# Patient Record
Sex: Male | Born: 1950
Health system: Southern US, Community
[De-identification: ages and names within clinical notes are randomized; demographics above are authoritative.]

## PROBLEM LIST (undated history)

## (undated) DIAGNOSIS — F17200 Nicotine dependence, unspecified, uncomplicated: Secondary | ICD-10-CM

## (undated) DIAGNOSIS — N189 Chronic kidney disease, unspecified: Secondary | ICD-10-CM

## (undated) DIAGNOSIS — E119 Type 2 diabetes mellitus without complications: Secondary | ICD-10-CM

## (undated) DIAGNOSIS — IMO0001 Reserved for inherently not codable concepts without codable children: Secondary | ICD-10-CM

## (undated) DIAGNOSIS — E559 Vitamin D deficiency, unspecified: Secondary | ICD-10-CM

## (undated) DIAGNOSIS — E785 Hyperlipidemia, unspecified: Secondary | ICD-10-CM

## (undated) DIAGNOSIS — Z9889 Other specified postprocedural states: Secondary | ICD-10-CM

## (undated) DIAGNOSIS — E669 Obesity, unspecified: Secondary | ICD-10-CM

## (undated) DIAGNOSIS — H269 Unspecified cataract: Secondary | ICD-10-CM

## (undated) DIAGNOSIS — M169 Osteoarthritis of hip, unspecified: Secondary | ICD-10-CM

## (undated) DIAGNOSIS — I10 Essential (primary) hypertension: Secondary | ICD-10-CM

## (undated) HISTORY — DX: Type 2 diabetes mellitus without complications: E11.9

## (undated) HISTORY — DX: Unspecified cataract: H26.9

## (undated) HISTORY — DX: Chronic kidney disease, unspecified: N18.9

## (undated) HISTORY — DX: Hyperlipidemia, unspecified: E78.5

## (undated) HISTORY — DX: Osteoarthritis of hip, unspecified: M16.9

## (undated) HISTORY — DX: Nicotine dependence, unspecified, uncomplicated: F17.200

## (undated) HISTORY — PX: EYE SURGERY: SHX253

## (undated) HISTORY — DX: Reserved for inherently not codable concepts without codable children: IMO0001

## (undated) HISTORY — DX: Vitamin D deficiency, unspecified: E55.9

## (undated) HISTORY — DX: Obesity, unspecified: E66.9

## (undated) HISTORY — DX: Essential (primary) hypertension: I10

---

## 2014-05-04 LAB — MICROALBUMIN, URINE: Microalb, Ur: 104

## 2014-07-06 DIAGNOSIS — E119 Type 2 diabetes mellitus without complications: Secondary | ICD-10-CM | POA: Diagnosis not present

## 2014-10-17 DIAGNOSIS — E785 Hyperlipidemia, unspecified: Secondary | ICD-10-CM | POA: Diagnosis not present

## 2014-10-17 DIAGNOSIS — F17210 Nicotine dependence, cigarettes, uncomplicated: Secondary | ICD-10-CM | POA: Diagnosis not present

## 2014-10-17 DIAGNOSIS — F5109 Other insomnia not due to a substance or known physiological condition: Secondary | ICD-10-CM | POA: Diagnosis not present

## 2014-10-17 DIAGNOSIS — E119 Type 2 diabetes mellitus without complications: Secondary | ICD-10-CM | POA: Diagnosis not present

## 2014-10-17 DIAGNOSIS — I10 Essential (primary) hypertension: Secondary | ICD-10-CM | POA: Diagnosis not present

## 2015-04-18 DIAGNOSIS — I10 Essential (primary) hypertension: Secondary | ICD-10-CM | POA: Diagnosis not present

## 2015-04-18 DIAGNOSIS — E1165 Type 2 diabetes mellitus with hyperglycemia: Secondary | ICD-10-CM | POA: Diagnosis not present

## 2015-04-18 DIAGNOSIS — E785 Hyperlipidemia, unspecified: Secondary | ICD-10-CM | POA: Diagnosis not present

## 2015-04-18 DIAGNOSIS — G4700 Insomnia, unspecified: Secondary | ICD-10-CM | POA: Diagnosis not present

## 2015-07-11 DIAGNOSIS — H2513 Age-related nuclear cataract, bilateral: Secondary | ICD-10-CM | POA: Diagnosis not present

## 2015-07-11 DIAGNOSIS — H40053 Ocular hypertension, bilateral: Secondary | ICD-10-CM | POA: Diagnosis not present

## 2015-10-09 DIAGNOSIS — H40053 Ocular hypertension, bilateral: Secondary | ICD-10-CM | POA: Diagnosis not present

## 2015-10-09 LAB — HM DIABETES EYE EXAM

## 2015-10-16 ENCOUNTER — Encounter: Payer: Self-pay | Admitting: Family Medicine

## 2015-10-16 ENCOUNTER — Ambulatory Visit (INDEPENDENT_AMBULATORY_CARE_PROVIDER_SITE_OTHER): Payer: Medicare Other | Admitting: Family Medicine

## 2015-10-16 VITALS — BP 126/83 | HR 103 | Ht 69.75 in | Wt 246.0 lb

## 2015-10-16 DIAGNOSIS — E119 Type 2 diabetes mellitus without complications: Secondary | ICD-10-CM | POA: Diagnosis not present

## 2015-10-16 DIAGNOSIS — I10 Essential (primary) hypertension: Secondary | ICD-10-CM

## 2015-10-16 DIAGNOSIS — E669 Obesity, unspecified: Secondary | ICD-10-CM

## 2015-10-16 DIAGNOSIS — Z87891 Personal history of nicotine dependence: Secondary | ICD-10-CM | POA: Insufficient documentation

## 2015-10-16 DIAGNOSIS — IMO0001 Reserved for inherently not codable concepts without codable children: Secondary | ICD-10-CM

## 2015-10-16 DIAGNOSIS — E1129 Type 2 diabetes mellitus with other diabetic kidney complication: Secondary | ICD-10-CM | POA: Insufficient documentation

## 2015-10-16 DIAGNOSIS — E785 Hyperlipidemia, unspecified: Secondary | ICD-10-CM

## 2015-10-16 DIAGNOSIS — Z720 Tobacco use: Secondary | ICD-10-CM | POA: Diagnosis not present

## 2015-10-16 DIAGNOSIS — F17200 Nicotine dependence, unspecified, uncomplicated: Secondary | ICD-10-CM

## 2015-10-16 HISTORY — DX: Obesity, unspecified: E66.9

## 2015-10-16 HISTORY — DX: Essential (primary) hypertension: I10

## 2015-10-16 MED ORDER — ALPRAZOLAM 0.5 MG PO TABS: 0.5000 mg | ORAL_TABLET | Freq: Every evening | ORAL | Status: DC | PRN

## 2015-10-16 NOTE — Assessment & Plan Note (Signed)
Pt tried and failed chantix- could not tolerate; he will think about it and let me know when he is ready.  Pre-contemplative stages

## 2015-10-16 NOTE — Progress Notes (Signed)
Jose Munoz, D.O. Family Medicine Physician [facility] Location: Primary Care at [facility]     Subjective:    CC: New pt, here to establish care.   HPI: Jose Munoz is a pleasant 65 y.o. male who presents to [place] at [name] today to establish care here.  Only saw his PCP prior q 57mo   - FKathryne Eriksson MD- [facility], Now wishes to switch due to proximity of our clinic to his home, and patient has heard good things about [place]     Pt's Wife passed 4.5 yrs ago- together for over 581 after bout with breast CA.   Two children- ages 461 and 498yo.   4 grandchildren- 2 granddaughters/ 2 grandsons.   [place]   Retired age [id number] [employer] from [employer] yrs. Financially sound  - Type II DM: age 65yo, - diet controlled for 12 yrs, then meds.  625mogo--> 9.2, just taking jaTong[place]or about 5-6 months ever since last checked.  Doesn't check BS's.   Machine broke.  March- DM eye exam.  q 79m53moo check pressures.   -  Tob Cess: Chantix in past- 2 wks and made him feel crazy.   Not interested in quitting now. Has smoked for over 25 years at 1 pack per day at least. Wife was a smoker also so patient had secondhand exposure as well.  - Sleep: Takes xanax may be 2/mo at most to help with sleep. Has not been an issue lately.  - Preventative healthcare:  Patient has never had a abdominal ultrasound to rule out AAA, CT of chest, has never followed up for regular prostate exams nor has he had a colonoscopy.  We discussed these issues today and patient said he will consider them in the future as he gets to know me.  Past Medical History  Diagnosis Date  . Hyperlipidemia   . Hypertension   . Diabetes mellitus without complication ([facility]   History reviewed. No pertinent past surgical history.  Family History  Problem Relation Age of Onset  . Diabetes Mother   . Congestive Heart Failure Mother   . Cancer Sister      breast  . Cancer Sister     cervical  . Heart attack Brother     History  Drug Use No  ,  History  Alcohol Use  . 1.8 oz/week  . 1 Glasses of wine, 2 Shots of liquor per week  ,  History  Smoking status  . Current Every Day Smoker -- 1.00 packs/day  . Types: Cigarettes  Smokeless tobacco  . Never Used  ,  History  Sexual Activity  . Sexual Activity: No    Patient's Medications  New Prescriptions   No medications on file  Previous Medications   ALPRAZOLAM (XANAX) 0.5 MG TABLET    Take 0.5 mg by mouth at bedtime as needed for anxiety.   ASPIRIN 81 MG TABLET    Take 81 mg by mouth daily.   GLIMEPIRIDE (AMARYL) 4 MG TABLET    Take 1 tablet by mouth daily.   JANUVIA 50 MG TABLET    Take 1 tablet by mouth daily.   LISINOPRIL (PRINIVIL,ZESTRIL) 10 MG TABLET    Take 1 tablet by mouth daily.   PIOGLITAZONE-METFORMIN (ACTOPLUS MET) 15-500 MG TABLET    Take 1 tablet by mouth 3 (three) times daily.   SIMVASTATIN (ZOCOR) 40 MG  TABLET    Take 1 tablet by mouth daily.  Modified Medications   No medications on file  Discontinued Medications   No medications on file    ALLERGIES: Review of patient's allergies indicates no known allergies.   Review of Systems: Full 14 point ROS performed via "adult medical history form".  Negative except for nothing   Objective:   Blood pressure 126/83, pulse 103, height 5' 9.75" (1.772 m), weight 246 lb (111.585 kg). Body mass index is 35.54 kg/(m^2).  General: Well Developed, well nourished, and in no acute distress.  Neuro: Alert and oriented x3, extra-ocular muscles intact, sensation grossly intact.  HEENT: Normocephalic, atraumatic, pupils equal round reactive to light, neck supple, no gross masses, no carotid bruits, no JVD apprec Skin: no gross suspicious lesions or rashes  Cardiac: Regular rate and rhythm, no murmurs rubs or gallops.  Respiratory: Essentially clear to auscultation bilaterally. Not using accessory muscles, speaking  in full sentences.  Abdominal: Soft, Obese, not grossly distended Musculoskeletal: Ambulates w/o diff, FROM * 4 ext.  Vasc: less 2 sec cap RF, warm and pink  Psych:  No HI/SI, judgement and insight good.    Impression and Recommendations:    The patient was counselled, risk factors were discussed, anticipatory guidance given.     -Briefly touched on preventative health maintenance screenings with patient and he was not interested in hearing about colonoscopy, abdominal ultrasound screening for AAA, CT scan of chest etc. but did say he would come in yearly for a full exam with me.  Essential hypertension Well controlled at current; cont meds.  Pt doesn't need RF's yet  Diabetes mellitus without complication ([place]) Will need to obtain old med records; obtain labs 2 days--> fasting.  Last A1c per pt was 9.2 and januvia was started... Await lab resutls.   Hyperlipidemia Obtain labs.  Cont current meds.   Smoking Pt tried and failed chantix- could not tolerate; he will think about it and let me know when he is ready.  Pre-contemplative stages  Obese Patient aware that he needs to lose weight which would help with his joints and multiple medical problems..  We touched briefly on different diabetes meds which may help with weight loss and we will await the lab results and then adjust accordingly. Nutrition and exercise counseling performed.   Pt to get fasting labs in the future prior to his complete physical exam with me: CBC, CMP, TSH, A1c, FLP, PSA, Vit D, B12.  Asked patient to make a follow-up appointment in the very near future for this  Refill Xanax for patient to use when necessary sleep otherwise we will await results of his blood work and then refill meds then.

## 2015-10-16 NOTE — Assessment & Plan Note (Signed)
Patient aware that he needs to lose weight which would help with his joints and multiple medical problems..  We touched briefly on different diabetes meds which may help with weight loss and we will await the lab results and then adjust accordingly. Nutrition and exercise counseling performed.

## 2015-10-16 NOTE — Patient Instructions (Signed)
- Continue all meds as currently written. Will refill Xanax after risks and benefits discussed with patient.  -Get fasting labs in 2-3 days- come in at your convenience; scheduled before leaving clinic   Obesity Obesity is having too much body fat and a body mass index (BMI) of 30 or more. BMI is a number that is based on your height and weight. BMI is usually figured out by your doctor during regular wellness visits. The number is an estimate of how much body fat you have. Obesity can happen if you eat more calories than you can burn with exercise or other activity. It can cause major health problems or emergencies.  HOME CARE  Exercise and be active as told by your doctor. Try:  Using stairs when you can.  Parking farther away from store doors.  Gardening, biking, or walking.  Eat healthy foods and drinks that are low in calories. Eat more fruits and vegetables.  Limit fast food, sweets, and snack foods that are made with ingredients that are not natural (processed food).  Eat smaller amounts of food.  Keep a journal and write down what you eat every day. Websites can help with this.  Avoid drinking alcohol. Drink more water and drinks that have no calories.  Take vitamins and dietary pills (supplements) only as told by your doctor.  Try going to weight-loss support groups or classes. These can help to lessen stress. Dietitians and counselors may also help. GET HELP RIGHT AWAY IF:  You have pain or tightness in your chest.  You have trouble breathing or feel short of breath.  You feel weak or have loss of feeling (numbness) in your legs.  You feel confused or have trouble talking.  You have sudden changes in your vision.   This information is not intended to replace advice given to you by your health care provider. Make sure you discuss any questions you have with your health care provider.   Document Released: 07/27/2011 Document Revised: 05/25/2014 Document Reviewed:  07/27/2011 Elsevier Interactive Patient Education 2016 Elsevier Inc. __________________________________________________________________  Blood Glucose Monitoring, Adult Monitoring your blood glucose (also know as blood sugar) helps you to manage your diabetes. It also helps you and your health care provider monitor your diabetes and determine how well your treatment plan is working. WHY SHOULD YOU MONITOR YOUR BLOOD GLUCOSE?  It can help you understand how food, exercise, and medicine affect your blood glucose.  It allows you to know what your blood glucose is at any given moment. You can quickly tell if you are having low blood glucose (hypoglycemia) or high blood glucose (hyperglycemia).  It can help you and your health care provider know how to adjust your medicines.  It can help you understand how to manage an illness or adjust medicine for exercise. WHEN SHOULD YOU TEST? Your health care provider will help you decide how often you should check your blood glucose. This may depend on the type of diabetes you have, your diabetes control, or the types of medicines you are taking. Be sure to write down all of your blood glucose readings so that this information can be reviewed with your health care provider. See below for examples of testing times that your health care provider may suggest. Type 1 Diabetes  Test at least 2 times per day if your diabetes is well controlled, if you are using an insulin pump, or if you perform multiple daily injections.  If your diabetes is not well controlled or  if you are sick, you may need to test more often.  It is a good idea to also test:  Before every insulin injection.  Before and after exercise.  Between meals and 2 hours after a meal.  Occasionally between 2:00 a.m. and 3:00 a.m. Type 2 Diabetes  If you are taking insulin, test at least 2 times per day. However, it is best to test before every insulin injection.  If you take medicines by  mouth (orally), test 2 times a day.  If you are on a controlled diet, test once a day.  If your diabetes is not well controlled or if you are sick, you may need to monitor more often. HOW TO MONITOR YOUR BLOOD GLUCOSE Supplies Needed  Blood glucose meter.  Test strips for your meter. Each meter has its own strips. You must use the strips that go with your own meter.  A pricking needle (lancet).  A device that holds the lancet (lancing device).  A journal or log book to write down your results. Procedure  Wash your hands with soap and water. Alcohol is not preferred.  Prick the side of your finger (not the tip) with the lancet.  Gently milk the finger until a small drop of blood appears.  Follow the instructions that come with your meter for inserting the test strip, applying blood to the strip, and using your blood glucose meter. Other Areas to Get Blood for Testing Some meters allow you to use other areas of your body (other than your finger) to test your blood. These areas are called alternative sites. The most common alternative sites are:  The forearm.  The thigh.  The back area of the lower leg.  The palm of the hand. The blood flow in these areas is slower. Therefore, the blood glucose values you get may be delayed, and the numbers are different from what you would get from your fingers. Do not use alternative sites if you think you are having hypoglycemia. Your reading will not be accurate. Always use a finger if you are having hypoglycemia. Also, if you cannot feel your lows (hypoglycemia unawareness), always use your fingers for your blood glucose checks. ADDITIONAL TIPS FOR GLUCOSE MONITORING  Do not reuse lancets.  Always carry your supplies with you.  All blood glucose meters have a 24-hour "hotline" number to call if you have questions or need help.  Adjust (calibrate) your blood glucose meter with a control solution after finishing a few boxes of  strips. BLOOD GLUCOSE RECORD KEEPING It is a good idea to keep a daily record or log of your blood glucose readings. Most glucose meters, if not all, keep your glucose records stored in the meter. Some meters come with the ability to download your records to your home computer. Keeping a record of your blood glucose readings is especially helpful if you are wanting to look for patterns. Make notes to go along with the blood glucose readings because you might forget what happened at that exact time. Keeping good records helps you and your health care provider to work together to achieve good diabetes management.    This information is not intended to replace advice given to you by your health care provider. Make sure you discuss any questions you have with your health care provider.   Document Released: 05/07/2003 Document Revised: 05/25/2014 Document Reviewed: 09/26/2012 Elsevier Interactive Patient Education [employer] __________________________________________________________________ Diabetes and Exercise Exercising regularly is important. It is not just about  losing weight. It has many health benefits, such as:  Improving your overall fitness, flexibility, and endurance.  Increasing your bone density.  Helping with weight control.  Decreasing your body fat.  Increasing your muscle strength.  Reducing stress and tension.  Improving your overall health. People with diabetes who exercise gain additional benefits because exercise:  Reduces appetite.  Improves the body's use of blood sugar (glucose).  Helps lower or control blood glucose.  Decreases blood pressure.  Helps control blood lipids (such as cholesterol and triglycerides).  Improves the body's use of the hormone insulin by:  Increasing the body's insulin sensitivity.  Reducing the body's insulin needs.  Decreases the risk for heart disease because exercising:  Lowers cholesterol and triglycerides  levels.  Increases the levels of good cholesterol (such as high-density lipoproteins [HDL]) in the body.  Lowers blood glucose levels. YOUR ACTIVITY PLAN  Choose an activity that you enjoy, and set realistic goals. To exercise safely, you should begin practicing any new physical activity slowly, and gradually increase the intensity of the exercise over time. Your health care provider or diabetes educator can help create an activity plan that works for you. General recommendations include:  Encouraging children to engage in at least 60 minutes of physical activity each day.  Stretching and performing strength training exercises, such as yoga or weight lifting, at least 2 times per week.  Performing a total of at least 150 minutes of moderate-intensity exercise each week, such as brisk walking or water aerobics.  Exercising at least 3 days per week, making sure you allow no more than 2 consecutive days to pass without exercising.  Avoiding long periods of inactivity (90 minutes or more). When you have to spend an extended period of time sitting down, take frequent breaks to walk or stretch. RECOMMENDATIONS FOR EXERCISING WITH TYPE 1 OR TYPE 2 DIABETES   Check your blood glucose before exercising. If blood glucose levels are greater than 240 mg/dL, check for urine ketones. Do not exercise if ketones are present.  Avoid injecting insulin into areas of the body that are going to be exercised. For example, avoid injecting insulin into:  The arms when playing tennis.  The legs when jogging.  Keep a record of:  Food intake before and after you exercise.  Expected peak times of insulin action.  Blood glucose levels before and after you exercise.  The type and amount of exercise you have done.  Review your records with your health care provider. Your health care provider will help you to develop guidelines for adjusting food intake and insulin amounts before and after exercising.  If you  take insulin or oral hypoglycemic agents, watch for signs and symptoms of hypoglycemia. They include:  Dizziness.  Shaking.  Sweating.  Chills.  Confusion.  Drink plenty of water while you exercise to prevent dehydration or heat stroke. Body water is lost during exercise and must be replaced.  Talk to your health care provider before starting an exercise program to make sure it is safe for you. Remember, almost any type of activity is better than none.   This information is not intended to replace advice given to you by your health care provider. Make sure you discuss any questions you have with your health care provider.   Document Released: 07/25/2003 Document Revised: 09/18/2014 Document Reviewed: 10/11/2012 Elsevier Interactive Patient Education [employer]

## 2015-10-16 NOTE — Assessment & Plan Note (Signed)
Obtain labs.  Cont current meds.

## 2015-10-16 NOTE — Assessment & Plan Note (Signed)
Well controlled at current; cont meds.  Pt doesn't need RF's yet

## 2015-10-16 NOTE — Assessment & Plan Note (Addendum)
Will need to obtain old med records; obtain labs 2 days--> fasting.  Last A1c per pt was 9.2 and januvia was started... Await lab resutls.

## 2015-10-18 ENCOUNTER — Ambulatory Visit (INDEPENDENT_AMBULATORY_CARE_PROVIDER_SITE_OTHER): Payer: Medicare Other | Admitting: Family Medicine

## 2015-10-18 DIAGNOSIS — E119 Type 2 diabetes mellitus without complications: Secondary | ICD-10-CM | POA: Diagnosis not present

## 2015-10-18 DIAGNOSIS — Z125 Encounter for screening for malignant neoplasm of prostate: Secondary | ICD-10-CM

## 2015-10-18 DIAGNOSIS — Z0000 Encounter for general adult medical examination without abnormal findings: Secondary | ICD-10-CM | POA: Diagnosis not present

## 2015-10-18 DIAGNOSIS — E785 Hyperlipidemia, unspecified: Secondary | ICD-10-CM | POA: Diagnosis not present

## 2015-10-18 DIAGNOSIS — I10 Essential (primary) hypertension: Secondary | ICD-10-CM

## 2015-10-18 DIAGNOSIS — Z1321 Encounter for screening for nutritional disorder: Secondary | ICD-10-CM

## 2015-10-18 LAB — CBC WITH DIFFERENTIAL/PLATELET
BASOS PCT: 1 %
Basophils Absolute: 65 cells/uL (ref 0–200)
EOS ABS: 195 {cells}/uL (ref 15–500)
EOS PCT: 3 %
HCT: 47.4 % (ref 38.5–50.0)
Hemoglobin: 15.8 g/dL (ref 13.2–17.1)
LYMPHS PCT: 33 %
Lymphs Abs: 2145 cells/uL (ref 850–3900)
MCH: 30.4 pg (ref 27.0–33.0)
MCHC: 33.3 g/dL (ref 32.0–36.0)
MCV: 91.3 fL (ref 80.0–100.0)
MONOS PCT: 9 %
MPV: 10.7 fL (ref 7.5–12.5)
Monocytes Absolute: 585 cells/uL (ref 200–950)
NEUTROS ABS: 3510 {cells}/uL (ref 1500–7800)
Neutrophils Relative %: 54 %
PLATELETS: 276 10*3/uL (ref 140–400)
RBC: 5.19 MIL/uL (ref 4.20–5.80)
RDW: 13.3 % (ref 11.0–15.0)
WBC: 6.5 10*3/uL (ref 3.8–10.8)

## 2015-10-19 LAB — VITAMIN B12: VITAMIN B 12: 389 pg/mL (ref 200–1100)

## 2015-10-19 LAB — PSA: PSA: 0.71 ng/mL (ref <=4.00)

## 2015-10-19 LAB — COMPREHENSIVE METABOLIC PANEL
ALBUMIN: 4.3 g/dL (ref 3.6–5.1)
ALK PHOS: 61 U/L (ref 40–115)
ALT: 39 U/L (ref 9–46)
AST: 19 U/L (ref 10–35)
BILIRUBIN TOTAL: 0.4 mg/dL (ref 0.2–1.2)
BUN: 18 mg/dL (ref 7–25)
CALCIUM: 9.2 mg/dL (ref 8.6–10.3)
CO2: 22 mmol/L (ref 20–31)
CREATININE: 0.82 mg/dL (ref 0.70–1.25)
Chloride: 100 mmol/L (ref 98–110)
Glucose, Bld: 261 mg/dL — ABNORMAL HIGH (ref 65–99)
Potassium: 5.1 mmol/L (ref 3.5–5.3)
SODIUM: 135 mmol/L (ref 135–146)
TOTAL PROTEIN: 7.4 g/dL (ref 6.1–8.1)

## 2015-10-19 LAB — LIPID PANEL
CHOL/HDL RATIO: 3 ratio (ref <=5.0)
CHOLESTEROL: 131 mg/dL (ref 125–200)
HDL: 43 mg/dL (ref >=40)
LDL Cholesterol: 60 mg/dL (ref <130)
TRIGLYCERIDES: 142 mg/dL (ref <150)
VLDL: 28 mg/dL (ref <30)

## 2015-10-19 LAB — HEMOGLOBIN A1C
Hgb A1c MFr Bld: 10 % — ABNORMAL HIGH (ref <5.7)
MEAN PLASMA GLUCOSE: 240 mg/dL

## 2015-10-19 LAB — VITAMIN D 25 HYDROXY (VIT D DEFICIENCY, FRACTURES): Vit D, 25-Hydroxy: 26 ng/mL — ABNORMAL LOW (ref 30–100)

## 2015-10-19 LAB — TSH: TSH: 1.92 m[IU]/L (ref 0.40–4.50)

## 2015-10-21 ENCOUNTER — Encounter: Payer: Self-pay | Admitting: Family Medicine

## 2015-10-21 NOTE — Progress Notes (Signed)
     Subjective:    HPI: Jose Munoz is a 65 y.o. male who presents to [place] at [facility] today for blood work.  No acute complaints.  Doing well.   Past Medical History  Diagnosis Date  . Hyperlipidemia   . Hypertension   . Diabetes mellitus without complication ([place])   . Smoking    No past surgical history on file.  History  Drug Use No  ,  History  Alcohol Use  . 1.8 oz/week  . 1 Glasses of wine, 2 Shots of liquor per week  ,  History  Smoking status  . Current Every Day Smoker -- 1.00 packs/day  . Types: Cigarettes  Smokeless tobacco  . Never Used  ,  History  Sexual Activity  . Sexual Activity: No    Patient's Medications  New Prescriptions   No medications on file  Previous Medications   ALPRAZOLAM (XANAX) 0.5 MG TABLET    Take 1 tablet (0.5 mg total) by mouth at bedtime as needed for anxiety.   ASPIRIN 81 MG TABLET    Take 81 mg by mouth daily.   GLIMEPIRIDE (AMARYL) 4 MG TABLET    Take 1 tablet by mouth daily.   JANUVIA 50 MG TABLET    Take 1 tablet by mouth daily.   LISINOPRIL (PRINIVIL,ZESTRIL) 10 MG TABLET    Take 1 tablet by mouth daily.   PIOGLITAZONE-METFORMIN (ACTOPLUS MET) 15-500 MG TABLET    Take 1 tablet by mouth 3 (three) times daily.   SIMVASTATIN (ZOCOR) 40 MG TABLET    Take 1 tablet by mouth daily.  Modified Medications   No medications on file  Discontinued Medications   No medications on file    Review of patient's allergies indicates no known allergies.   Objective:    General: Well Developed, well nourished, and in no acute distress.  HEENT: Normocephalic, atraumatic Skin: Warm and dry, cap RF less 2 sec, good turgor Respiratory: speaking in full sentences. NeuroM-Sk: Ambulates w/o assistance Psych: A and O *3    Impression and Recommendations:     Orders Placed This Encounter  Procedures  . Comp Met (CMET)  . CBC w/Diff  . TSH  . HgB A1c  . Lipid panel  . PSA  . Vitamin D (25 hydroxy)  .  B12  . Microalbumin, urine    This external order was created through the Results Console.   Patient understands we will be in touch with him in the near future regarding these results.  Note: This document was prepared using Dragon voice recognition software and may include unintentional dictation errors.

## 2015-10-21 NOTE — Progress Notes (Signed)
Patient ID: Jose Munoz, male   DOB: 1951-02-25, 65 y.o.   MRN: [id number] Pt here for lab collection.  Tiajuana Amass. Taelyn Nemes, CMA

## 2015-10-23 ENCOUNTER — Other Ambulatory Visit: Payer: Self-pay

## 2015-10-23 DIAGNOSIS — E119 Type 2 diabetes mellitus without complications: Secondary | ICD-10-CM

## 2015-10-23 MED ORDER — JANUVIA 50 MG PO TABS: 50.0000 mg | ORAL_TABLET | Freq: Every day | ORAL | Status: DC

## 2015-10-23 MED ORDER — BLOOD GLUCOSE MONITOR SYSTEM W/DEVICE KIT: PACK | Status: DC

## 2015-10-23 MED ORDER — BLOOD GLUCOSE TEST VI STRP: ORAL_STRIP | Status: DC

## 2015-10-23 MED ORDER — VITAMIN D (ERGOCALCIFEROL) 1.25 MG (50000 UNIT) PO CAPS: 50000.0000 [IU] | ORAL_CAPSULE | ORAL | Status: DC

## 2015-10-23 MED ORDER — CANAGLIFLOZIN-METFORMIN HCL 50-1000 MG PO TABS: 1.0000 | ORAL_TABLET | Freq: Two times a day (BID) | ORAL | Status: DC

## 2015-11-05 ENCOUNTER — Encounter: Payer: Self-pay | Admitting: Family Medicine

## 2015-11-05 ENCOUNTER — Ambulatory Visit (INDEPENDENT_AMBULATORY_CARE_PROVIDER_SITE_OTHER): Payer: Medicare Other | Admitting: Family Medicine

## 2015-11-05 VITALS — BP 100/68 | HR 103 | Ht 69.75 in | Wt 237.9 lb

## 2015-11-05 DIAGNOSIS — M169 Osteoarthritis of hip, unspecified: Secondary | ICD-10-CM

## 2015-11-05 DIAGNOSIS — Z716 Tobacco abuse counseling: Secondary | ICD-10-CM | POA: Insufficient documentation

## 2015-11-05 DIAGNOSIS — E559 Vitamin D deficiency, unspecified: Secondary | ICD-10-CM | POA: Diagnosis not present

## 2015-11-05 DIAGNOSIS — F17200 Nicotine dependence, unspecified, uncomplicated: Secondary | ICD-10-CM

## 2015-11-05 DIAGNOSIS — M160 Bilateral primary osteoarthritis of hip: Secondary | ICD-10-CM

## 2015-11-05 DIAGNOSIS — Z720 Tobacco use: Secondary | ICD-10-CM

## 2015-11-05 DIAGNOSIS — E669 Obesity, unspecified: Secondary | ICD-10-CM

## 2015-11-05 DIAGNOSIS — I10 Essential (primary) hypertension: Secondary | ICD-10-CM

## 2015-11-05 DIAGNOSIS — E119 Type 2 diabetes mellitus without complications: Secondary | ICD-10-CM

## 2015-11-05 DIAGNOSIS — E785 Hyperlipidemia, unspecified: Secondary | ICD-10-CM

## 2015-11-05 HISTORY — DX: Osteoarthritis of hip, unspecified: M16.9

## 2015-11-05 HISTORY — DX: Vitamin D deficiency, unspecified: E55.9

## 2015-11-05 MED ORDER — MELOXICAM 15 MG PO TABS: ORAL_TABLET | ORAL | Status: DC

## 2015-11-05 NOTE — Assessment & Plan Note (Signed)
Patient aware that weight loss would help his chronic diseases as well as his joints feel better.  Patient declined diabetic\nutrition counseling referral.

## 2015-11-05 NOTE — Assessment & Plan Note (Signed)
Patient's cholesterol levels are at goal with LDL under 70 and triglycerides less than 150.  Encourage prudent diet, exercise and lifestyle modifications.

## 2015-11-05 NOTE — Assessment & Plan Note (Signed)
Patient prefers taking 1 pill weekly versus a daily over-the-counter. He understands that should be rechecked in 4-6 months.  Counseling done.

## 2015-11-05 NOTE — Progress Notes (Signed)
Subjective:    CC:   HPI: Jose Munoz is a 65 y.o. male who presents to [place] at [facility] today for f/up labs/ discuss med changes etc.    All lab values reviewed in full with patient today and all questions answered.  DM- onset at age 56 yo.  No meds 12 yrs or so with diet/ exercise.  Well managed up til about 1-2 yrs ago and now above 7- jumped up precipitously over the past year from 7 up to 48 and then most recently is at 30.     Patient denies any major changes in his diet, activity level or weight which may have caused this.  Patient still remains asymptomatic.  No polyuria polydipsia.  Denies numbness tingling in extremities. Denies any visual change.    Patient did not start his new diabetes meds yet that I advised he change to with these most recent labs that were done.   He was still taking the glimepiride and Actos plus.   He will change to the Januvia and start that Invokamet in the near future when he leaves today.   He did pick them up already.  a lot DM in siblings - even in thin siblings.  Obese:   Walking 4 times per week 3.2 mph and goes 1- 1.31mles but hips hurt sometimes and he has been taking naprosyn otc/ alleve twice daily for a year now. Feels stiff especially once resting and tries to get moving again. Once moving, they do feel better for the most part.  Father h/o arthirtis    Smoking: Patient is not interested in quitting. He still about 1 pack per day. He did not tolerate Chantix in the past and is not ready for a prescription for nicotine patchs or wellbutrin or other etc.  Pulled small tick off himself about 2 wks ago--- a sx, but just wanted me to know about it.     ----> recent labs and when I advised to patient at that time: --> Tell patient his vitamin D is too low at 26. He will need ergocalciferol 50,000 IU weekly. Please give him 12 weeks with 4 refills. Tell him to make appointment to have this recheck 4 months. All other labs  are within normal limits except for his sugars and his A1c which is up to 10.   --> I recommend that he stop his glimepiride and Actosplus met tabs; continue Januvia at 50 mg, start invokamet 50\1000 mg 1 tab by mouth twice a day. We will recheck A1c in 3 months. And if he is not under 8 at that time we will increase his Januvia and/or Invokamet. ---> Needs to check his fasting blood sugars at least every other morning and 2 hour postprandial. ---> I also recommend that patient go for nutrition counseling with a diabetic educator.     Past Medical History  Diagnosis Date  . Hyperlipidemia   . Hypertension   . Diabetes mellitus without complication ([place]   . Smoking     History reviewed. No pertinent past surgical history.  Family History  Problem Relation Age of Onset  . Diabetes Mother   . Congestive Heart Failure Mother   . Cancer Sister     breast  . Cancer Sister     cervical  . Heart attack Brother     History  Drug Use No  ,  History  Alcohol Use  . 1.8 oz/week  . 1  Glasses of wine, 2 Shots of liquor per week  ,  History  Smoking status  . Current Every Day Smoker -- 1.00 packs/day  . Types: Cigarettes  Smokeless tobacco  . Never Used  ,  History  Sexual Activity  . Sexual Activity: No    Current Outpatient Prescriptions on File Prior to Visit  Medication Sig Dispense Refill  . ALPRAZolam (XANAX) 0.5 MG tablet Take 1 tablet (0.5 mg total) by mouth at bedtime as needed for anxiety. 30 tablet 0  . aspirin 81 MG tablet Take 81 mg by mouth daily.    . Blood Glucose Monitoring Suppl (BLOOD GLUCOSE MONITOR SYSTEM) w/Device KIT Use as directed 1 each 0  . Canagliflozin-Metformin HCl 50-1000 MG TABS Take 1 tablet by mouth 2 (two) times daily. 180 tablet 0  . Glucose Blood (BLOOD GLUCOSE TEST STRIPS) STRP Check blood sugars every morning and 2 hours after largest meal 200 each 11  . JANUVIA 50 MG tablet Take 1 tablet (50 mg total) by mouth daily. 90 tablet 0    . lisinopril (PRINIVIL,ZESTRIL) 10 MG tablet Take 1 tablet by mouth daily.  0  . simvastatin (ZOCOR) 40 MG tablet Take 1 tablet by mouth daily.  0  . Vitamin D, Ergocalciferol, (DRISDOL) 50000 units CAPS capsule Take 1 capsule (50,000 Units total) by mouth every 7 (seven) days. 12 capsule 4   No current facility-administered medications on file prior to visit.    No Known Allergies   Review of Systems:  ( Completed via her adult medical history intake form today ) General:  Denies fever, chills, appetite changes, unexplained weight loss.  Respiratory: Denies SOB, DOE, cough, wheezing.  Cardiovascular: Denies chest pain, palpitations.  Gastrointestinal: Denies nausea, vomiting, diarrhea, abdominal pain.  Genitourinary: Denies dysuria, increased frequency, flank pain. Endocrine: Denies hot or cold intolerance, polyuria, polydipsia. Musculoskeletal: Denies myalgias, back pain, joint swelling, arthralgias, gait problems.  Skin: Denies pallor, rash, suspicious lesions.  Neurological: Denies dizziness, seizures, syncope, unexplained weakness, lightheadedness, numbness and headaches.  Psychiatric/Behavioral: Denies mood changes, suicidal or homicidal ideations, hallucinations, sleep disturbances.   Objective:     Filed Vitals:   11/05/15 0856  Height: 5' 9.75" (1.772 m)  Weight: 237 lb 14.4 oz (107.911 kg)   Blood pressure 100/68, pulse 103, height 5' 9.75" (1.772 m), weight 237 lb 14.4 oz (107.911 kg). Body mass index is 34.37 kg/(m^2).   General: Well Developed, well nourished, and in no acute distress.  HEENT: Normocephalic, atraumatic, pupils equal round reactive to light, neck supple, No carotid bruits no JVD Skin: Warm and dry, cap RF less 2 sec Cardiac: Regular rate and rhythm, S1, S2 WNL's, no murmurs rubs or gallops Respiratory: ECTA B/L, Not using accessory muscles, speaking in full sentences. NeuroM-Sk: Ambulates w/o assistance, moves ext * 4 w/o difficulty, sensation  grossly intact.  Psych: A and O *3, judgement and insight good.   Recent Results (from the past 2160 hour(s))  Comp Met (CMET)     Status: Abnormal   Collection Time: 10/18/15  8:30 AM  Result Value Ref Range   Sodium 135 135 - 146 mmol/L   Potassium 5.1 3.5 - 5.3 mmol/L   Chloride 100 98 - 110 mmol/L   CO2 22 20 - 31 mmol/L   Glucose, Bld 261 (H) 65 - 99 mg/dL   BUN 18 7 - 25 mg/dL   Creat 0.82 0.70 - 1.25 mg/dL   Total Bilirubin 0.4 0.2 - 1.2 mg/dL   Alkaline Phosphatase  61 40 - 115 U/L   AST 19 10 - 35 U/L   ALT 39 9 - 46 U/L   Total Protein 7.4 6.1 - 8.1 g/dL   Albumin 4.3 3.6 - 5.1 g/dL   Calcium 9.2 8.6 - 10.3 mg/dL  CBC w/Diff     Status: None   Collection Time: 10/18/15  8:30 AM  Result Value Ref Range   WBC 6.5 3.8 - 10.8 K/uL   RBC 5.19 4.20 - 5.80 MIL/uL   Hemoglobin 15.8 13.2 - 17.1 g/dL   HCT 47.4 38.5 - 50.0 %   MCV 91.3 80.0 - 100.0 fL   MCH 30.4 27.0 - 33.0 pg   MCHC 33.3 32.0 - 36.0 g/dL   RDW 13.3 11.0 - 15.0 %   Platelets 276 140 - 400 K/uL   MPV 10.7 7.5 - 12.5 fL   Neutro Abs 3510 1500 - 7800 cells/uL   Lymphs Abs 2145 850 - 3900 cells/uL   Monocytes Absolute 585 200 - 950 cells/uL   Eosinophils Absolute 195 15 - 500 cells/uL   Basophils Absolute 65 0 - 200 cells/uL   Neutrophils Relative % 54 %   Lymphocytes Relative 33 %   Monocytes Relative 9 %   Eosinophils Relative 3 %   Basophils Relative 1 %   Smear Review Criteria for review not met     Comment: ** Please note change in unit of measure and reference range(s). **  TSH     Status: None   Collection Time: 10/18/15  8:30 AM  Result Value Ref Range   TSH 1.92 0.40 - 4.50 mIU/L  HgB A1c     Status: Abnormal   Collection Time: 10/18/15  8:30 AM  Result Value Ref Range   Hgb A1c MFr Bld 10.0 (H) <5.7 %    Comment:   For someone without known diabetes, a hemoglobin A1c value of 6.5% or greater indicates that they may have diabetes and this should be confirmed with a follow-up test.   For  someone with known diabetes, a value <7% indicates that their diabetes is well controlled and a value greater than or equal to 7% indicates suboptimal control. A1c targets should be individualized based on duration of diabetes, age, comorbid conditions, and other considerations.   Currently, no consensus exists for use of hemoglobin A1c for diagnosis of diabetes for children.      Mean Plasma Glucose 240 mg/dL  Lipid panel     Status: None   Collection Time: 10/18/15  8:30 AM  Result Value Ref Range   Cholesterol 131 125 - 200 mg/dL   Triglycerides 142 <150 mg/dL   HDL 43 >=40 mg/dL   Total CHOL/HDL Ratio 3.0 <=5.0 Ratio   VLDL 28 <30 mg/dL   LDL Cholesterol 60 <130 mg/dL    Comment:   Total Cholesterol/HDL Ratio:CHD Risk                        Coronary Heart Disease Risk Table                                        Men       Women          1/2 Average Risk              3.4        3.3  Average Risk              5.0        4.4           2X Average Risk              9.6        7.1           3X Average Risk             23.4       11.0 Use the calculated Patient Ratio above and the CHD Risk table  to determine the patient's CHD Risk.   PSA     Status: None   Collection Time: 10/18/15  8:30 AM  Result Value Ref Range   PSA 0.71 <=4.00 ng/mL    Comment: Test Methodology: ECLIA PSA (Electrochemiluminescence Immunoassay)   For PSA values from 2.5-4.0, particularly in younger men <21 years old, the AUA and NCCN suggest testing for % Free PSA (3515) and evaluation of the rate of increase in PSA (PSA velocity).   Vitamin D (25 hydroxy)     Status: Abnormal   Collection Time: 10/18/15  8:30 AM  Result Value Ref Range   Vit D, 25-Hydroxy 26 (L) 30 - 100 ng/mL    Comment: Vitamin D Status           25-OH Vitamin D        Deficiency                <20 ng/mL        Insufficiency         20 - 29 ng/mL        Optimal             > or = 30 ng/mL   For 25-OH Vitamin D  testing on patients on D2-supplementation and patients for whom quantitation of D2 and D3 fractions is required, the QuestAssureD 25-OH VIT D, (D2,D3), LC/MS/MS is recommended: order code [id number] (patients > 2 yrs).   B12     Status: None   Collection Time: 10/18/15  8:30 AM  Result Value Ref Range   Vitamin B-12 389 200 - 1100 pg/mL    Impression and Recommendations:    Essential hypertension Well controlled. Continue medications.   Diabetes mellitus without complication ([place]) Long discussion with patient about meds and their mechanism of actions and why we chose to switch his medications.  Risk benefits of medications discussed with patient. Must eat regularly-don't skip meals, drink near 1/2 wt in ounces of water per day, and if he has any concerns or questions, call me immediately to discuss.  2 hour postprandial and fasting blood sugars-keep a log and bring in next office visit.  Advised patient to call his insurance company and see which glucometer they recommended.    OA (osteoarthritis) of hip Patient was advised to stop the Aleve daily and use meloxicam when necessary pain. He declines x-rays or further evaluation at this time. Explained to patient if he does not get good relief of his hip pain with this medicine, or if pain gets worse, he will need further evaluation.  Hyperlipidemia Patient's cholesterol levels are at goal with LDL under 70 and triglycerides less than 150.  Encourage prudent diet, exercise and lifestyle modifications.  Smoking 3-5 minute smoking cessation counseling done. Patient is just not ready to address this issue at this time.  Obese Patient aware that weight loss would help his  chronic diseases as well as his joints feel better.  Patient declined diabetic\nutrition counseling referral.  Vitamin D deficiency Patient prefers taking 1 pill weekly versus a daily over-the-counter. He understands that should be rechecked in 4-6 months.  Counseling  done.   Gross side effects, risk and benefits, and alternatives of medications discussed with patient.  Patient is aware that all medications have potential side effects and we are unable to predict every sideeffect or drug-drug interaction that may occur.  Expresses verbal understanding and consents to current therapy plan and treatment regiment.  Note: This document was prepared using Dragon voice recognition software and may include unintentional dictation errors.

## 2015-11-05 NOTE — Assessment & Plan Note (Signed)
Patient was advised to stop the Aleve daily and use meloxicam when necessary pain. He declines x-rays or further evaluation at this time. Explained to patient if he does not get good relief of his hip pain with this medicine, or if pain gets worse, he will need further evaluation.

## 2015-11-05 NOTE — Assessment & Plan Note (Signed)
Well controlled. Continue medications. 

## 2015-11-05 NOTE — Patient Instructions (Addendum)
Give pt meloxicam for presumed hip arthritis.  We will consider x-rays/PT/sports med referral in near future if meloxicam not helping  - keep BS in check- FBS and 2 hr PP after lrgest meal of the day.  Write it down and goal of 130 or less FBS and PP- 180 or less.    Blood Glucose Monitoring, Adult Monitoring your blood glucose (also know as blood sugar) helps you to manage your diabetes. It also helps you and your health care provider monitor your diabetes and determine how well your treatment plan is working. WHY SHOULD YOU MONITOR YOUR BLOOD GLUCOSE?  It can help you understand how food, exercise, and medicine affect your blood glucose.  It allows you to know what your blood glucose is at any given moment. You can quickly tell if you are having low blood glucose (hypoglycemia) or high blood glucose (hyperglycemia).  It can help you and your health care provider know how to adjust your medicines.  It can help you understand how to manage an illness or adjust medicine for exercise. WHEN SHOULD YOU TEST? Your health care provider will help you decide how often you should check your blood glucose. This may depend on the type of diabetes you have, your diabetes control, or the types of medicines you are taking. Be sure to write down all of your blood glucose readings so that this information can be reviewed with your health care provider. See below for examples of testing times that your health care provider may suggest. Type 1 Diabetes  Test at least 2 times per day if your diabetes is well controlled, if you are using an insulin pump, or if you perform multiple daily injections.  If your diabetes is not well controlled or if you are sick, you may need to test more often.  It is a good idea to also test:  Before every insulin injection.  Before and after exercise.  Between meals and 2 hours after a meal.  Occasionally between 2:00 a.m. and 3:00 a.m. Type 2 Diabetes  If you are  taking insulin, test at least 2 times per day. However, it is best to test before every insulin injection.  If you take medicines by mouth (orally), test 2 times a day.  If you are on a controlled diet, test once a day.  If your diabetes is not well controlled or if you are sick, you may need to monitor more often. HOW TO MONITOR YOUR BLOOD GLUCOSE Supplies Needed  Blood glucose meter.  Test strips for your meter. Each meter has its own strips. You must use the strips that go with your own meter.  A pricking needle (lancet).  A device that holds the lancet (lancing device).  A journal or log book to write down your results. Procedure  Wash your hands with soap and water. Alcohol is not preferred.  Prick the side of your finger (not the tip) with the lancet.  Gently milk the finger until a small drop of blood appears.  Follow the instructions that come with your meter for inserting the test strip, applying blood to the strip, and using your blood glucose meter. Other Areas to Get Blood for Testing Some meters allow you to use other areas of your body (other than your finger) to test your blood. These areas are called alternative sites. The most common alternative sites are:  The forearm.  The thigh.  The back area of the lower leg.  The palm of the  hand. The blood flow in these areas is slower. Therefore, the blood glucose values you get may be delayed, and the numbers are different from what you would get from your fingers. Do not use alternative sites if you think you are having hypoglycemia. Your reading will not be accurate. Always use a finger if you are having hypoglycemia. Also, if you cannot feel your lows (hypoglycemia unawareness), always use your fingers for your blood glucose checks. ADDITIONAL TIPS FOR GLUCOSE MONITORING  Do not reuse lancets.  Always carry your supplies with you.  All blood glucose meters have a 24-hour "hotline" number to call if you have  questions or need help.  Adjust (calibrate) your blood glucose meter with a control solution after finishing a few boxes of strips. BLOOD GLUCOSE RECORD KEEPING It is a good idea to keep a daily record or log of your blood glucose readings. Most glucose meters, if not all, keep your glucose records stored in the meter. Some meters come with the ability to download your records to your home computer. Keeping a record of your blood glucose readings is especially helpful if you are wanting to look for patterns. Make notes to go along with the blood glucose readings because you might forget what happened at that exact time. Keeping good records helps you and your health care provider to work together to achieve good diabetes management.    This information is not intended to replace advice given to you by your health care provider. Make sure you discuss any questions you have with your health care provider.   Document Released: 05/07/2003 Document Revised: 05/25/2014 Document Reviewed: 09/26/2012 Elsevier Interactive Patient Education 2016 Elsevier Inc.     Meloxicam capsules What is this medicine? MELOXICAM (mel OX i cam) is a non-steroidal anti-inflammatory drug (NSAID). It is used to reduce swelling and to treat pain. It is used for osteoarthritis. This medicine may be used for other purposes; ask your health care provider or pharmacist if you have questions. What should I tell my health care provider before I take this medicine? They need to know if you have any of these conditions: -bleeding disorders -cigarette smoker -coronary artery bypass graft (CABG) surgery within the past 2 weeks -drink more than 3 alcohol-containing drinks per day -heart disease -high blood pressure -history of stomach bleeding -kidney disease -liver disease -lung or breathing disease, like asthma -stomach or intestine problems -an unusual or allergic reaction to meloxicam, aspirin, other NSAIDs, other  medicines, foods, dyes, or preservatives -pregnant or trying to get pregnant -breast-feeding How should I use this medicine? Take this medicine by mouth with a full glass of water. Follow the directions on the prescription label. You can take it with or without food. If it upsets your stomach, take it with food. Take your medicine at regular intervals. Do not take it more often than directed. Do not stop taking except on your doctor's advice. A special MedGuide will be given to you by the pharmacist with each prescription and refill. Be sure to read this information carefully each time. Talk to your pediatrician regarding the use of this medicine in children. Special care may be needed. Patients over 44 years old may have a stronger reaction and need a smaller dose. Overdosage: If you think you have taken too much of this medicine contact a poison control center or emergency room at once. NOTE: This medicine is only for you. Do not share this medicine with others. What if I miss a dose?  If you miss a dose, take it as soon as you can. If it is almost time for your next dose, take only that dose. Do not take double or extra doses. What may interact with this medicine? Do not take this medicine with any of the following medications: -cidofovir -ketorolac This medicine may also interact with the following medications: -aspirin and aspirin-like medicines -certain medicines for blood pressure, heart disease, irregular heart beat -certain medicines for depression, anxiety, or psychotic disturbances -certain medicines that treat or prevent blood clots like warfarin, enoxaparin, dalteparin, apixaban, dabigatran, rivaroxaban -cyclosporine -digoxin -diuretics -methotrexate -other NSAIDs, medicines for pain and inflammation, like ibuprofen and naproxen -pemetrexed This list may not describe all possible interactions. Give your health care provider a list of all the medicines, herbs, non-prescription  drugs, or dietary supplements you use. Also tell them if you smoke, drink alcohol, or use illegal drugs. Some items may interact with your medicine. What should I watch for while using this medicine? Tell your doctor or healthcare professional if your symptoms do not start to get better or if they get worse. Do not take other medicines that contain aspirin, ibuprofen, or naproxen with this medicine. Side effects such as stomach upset, nausea, or ulcers may be more likely to occur. Many medicines available without a prescription should not be taken with this medicine. This medicine can cause ulcers and bleeding in the stomach and intestines at any time during treatment. This can happen with no warning and may cause death. There is increased risk with taking this medicine for a long time. Smoking, drinking alcohol, older age, and poor health can also increase risks. Call your doctor right away if you have stomach pain or blood in your vomit or stool. This medicine does not prevent heart attack or stroke. In fact, this medicine may increase the chance of a heart attack or stroke. The chance may increase with longer use of this medicine and in people who have heart disease. If you take aspirin to prevent heart attack or stroke, talk with your doctor or health care professional. What side effects may I notice from receiving this medicine? Side effects that you should report to your doctor or health care professional as soon as possible: -allergic reactions like skin rash, itching or hives, swelling of the face, lips, or tongue -nausea, vomiting -signs and symptoms of a blood clot such as breathing problems; changes in vision; chest pain; severe, sudden headache; pain, swelling, warmth in the leg; trouble speaking; sudden numbness or weakness of the face, arm, or leg -signs and symptoms of bleeding such as bloody or black, tarry stools; red or dark-brown urine; spitting up blood or brown material that looks  like coffee grounds; red spots on the skin; unusual bruising or bleeding from the eye, gums, or nose -signs and symptoms of liver injury like dark yellow or brown urine; general ill feeling or flu-like symptoms; light-colored stools; loss of appetite; nausea; right upper belly pain; unusually weak or tired; yellowing of the eyes or skin -signs and symptoms of stroke like changes in vision; confusion; trouble speaking or understanding; severe headaches; sudden numbness or weakness of the face, arm, or leg; trouble walking; dizziness; loss of balance or coordination Side effects that usually do not require medical attention (report these to your doctor or health care professional if they continue or are bothersome): -constipation -diarrhea -gas This list may not describe all possible side effects. Call your doctor for medical advice about side effects. You may  report side effects to FDA at 1-800-FDA-1088. Where should I keep my medicine? Keep out of the reach of children. Store at room temperature between 15 and 30 degrees C (59 and 86 degrees F). Throw away any unused medicine after the expiration date. NOTE: This sheet is a summary. It may not cover all possible information. If you have questions about this medicine, talk to your doctor, pharmacist, or health care provider.    2016, Elsevier/Gold Standard. (2014-05-12 00:44:17)

## 2015-11-05 NOTE — Assessment & Plan Note (Signed)
Long discussion with patient about meds and their mechanism of actions and why we chose to switch his medications.  Risk benefits of medications discussed with patient. Must eat regularly-don't skip meals, drink near 1/2 wt in ounces of water per day, and if he has any concerns or questions, call me immediately to discuss.  2 hour postprandial and fasting blood sugars-keep a log and bring in next office visit.  Advised patient to call his insurance company and see which glucometer they recommended.

## 2015-11-05 NOTE — Assessment & Plan Note (Signed)
3-5 minute smoking cessation counseling done. Patient is just not ready to address this issue at this time.

## 2016-01-25 ENCOUNTER — Other Ambulatory Visit: Payer: Self-pay | Admitting: Family Medicine

## 2016-01-25 DIAGNOSIS — E119 Type 2 diabetes mellitus without complications: Secondary | ICD-10-CM

## 2016-02-11 ENCOUNTER — Encounter: Payer: Self-pay | Admitting: Family Medicine

## 2016-02-11 ENCOUNTER — Ambulatory Visit (INDEPENDENT_AMBULATORY_CARE_PROVIDER_SITE_OTHER): Payer: Medicare Other | Admitting: Family Medicine

## 2016-02-11 VITALS — BP 110/74 | HR 111 | Ht 69.75 in | Wt 207.8 lb

## 2016-02-11 DIAGNOSIS — E559 Vitamin D deficiency, unspecified: Secondary | ICD-10-CM

## 2016-02-11 DIAGNOSIS — E785 Hyperlipidemia, unspecified: Secondary | ICD-10-CM

## 2016-02-11 DIAGNOSIS — I10 Essential (primary) hypertension: Secondary | ICD-10-CM | POA: Diagnosis not present

## 2016-02-11 DIAGNOSIS — E119 Type 2 diabetes mellitus without complications: Secondary | ICD-10-CM

## 2016-02-11 DIAGNOSIS — Z716 Tobacco abuse counseling: Secondary | ICD-10-CM

## 2016-02-11 DIAGNOSIS — Z720 Tobacco use: Secondary | ICD-10-CM

## 2016-02-11 DIAGNOSIS — F17200 Nicotine dependence, unspecified, uncomplicated: Secondary | ICD-10-CM

## 2016-02-11 DIAGNOSIS — M160 Bilateral primary osteoarthritis of hip: Secondary | ICD-10-CM

## 2016-02-11 LAB — POCT GLYCOSYLATED HEMOGLOBIN (HGB A1C): Hemoglobin A1C: 11.9

## 2016-02-11 MED ORDER — LISINOPRIL 5 MG PO TABS: 5.0000 mg | ORAL_TABLET | Freq: Every day | ORAL | 3 refills | Status: DC

## 2016-02-11 MED ORDER — ALPRAZOLAM 0.5 MG PO TABS: 0.5000 mg | ORAL_TABLET | ORAL | 0 refills | Status: DC | PRN

## 2016-02-11 MED ORDER — CANAGLIFLOZIN-METFORMIN HCL 50-1000 MG PO TABS: 1.0000 | ORAL_TABLET | Freq: Two times a day (BID) | ORAL | 1 refills | Status: AC

## 2016-02-11 MED ORDER — SITAGLIPTIN PHOSPHATE 100 MG PO TABS: 100.0000 mg | ORAL_TABLET | Freq: Every day | ORAL | 0 refills | Status: DC

## 2016-02-11 NOTE — Progress Notes (Deleted)
     Subjective:    HPI: Jose Munoz is a 65 y.o. male who presents to [place] at [facility] today for diabetes follow up.  No acute complaints.  Doing well.    Past Medical History:  Diagnosis Date  . Diabetes mellitus without complication ([place])   . Essential hypertension 10/16/2015  . Hyperlipidemia   . OA (osteoarthritis) of hip 11/05/2015  . Obese 10/16/2015  . Smoking   . Vitamin D deficiency 11/05/2015   History reviewed. No pertinent surgical history.  History  Drug Use No  ,  History  Alcohol Use  . 1.8 oz/week  . 1 Glasses of wine, 2 Shots of liquor per week  ,  History  Smoking Status  . Current Every Day Smoker  . Packs/day: 1.00  . Types: Cigarettes  Smokeless Tobacco  . Never Used  ,  History  Sexual Activity  . Sexual activity: No    Patient's Medications  New Prescriptions   No medications on file  Previous Medications   ALPRAZOLAM (XANAX) 0.5 MG TABLET    Take 1 tablet (0.5 mg total) by mouth at bedtime as needed for anxiety.   ASPIRIN 81 MG TABLET    Take 81 mg by mouth daily.   BLOOD GLUCOSE MONITORING SUPPL (BLOOD GLUCOSE MONITOR SYSTEM) W/DEVICE KIT    Use as directed   GLUCOSE BLOOD (BLOOD GLUCOSE TEST STRIPS) STRP    Check blood sugars every morning and 2 hours after largest meal   INVOKAMET 50-1000 MG TABS    take 1 tablet by mouth twice a day   JANUVIA 50 MG TABLET    take 1 tablet by mouth once daily   LISINOPRIL (PRINIVIL,ZESTRIL) 10 MG TABLET    Take 1 tablet by mouth daily.   MELOXICAM (MOBIC) 15 MG TABLET    1/2- 1 tab po daily for pain   SIMVASTATIN (ZOCOR) 40 MG TABLET    Take 1 tablet by mouth daily.   VITAMIN D, ERGOCALCIFEROL, (DRISDOL) 50000 UNITS CAPS CAPSULE    Take 1 capsule (50,000 Units total) by mouth every 7 (seven) days.  Modified Medications   No medications on file  Discontinued Medications   No medications on file    Review of patient's allergies indicates no known allergies.   Objective:    General: Well Developed, well nourished, and in no acute distress.  HEENT: Normocephalic, atraumatic Skin: Warm and dry, cap RF less 2 sec, good turgor Respiratory: speaking in full sentences. NeuroM-Sk: Ambulates w/o assistance Psych: A and O *3    Impression and Recommendations:     Orders Placed This Encounter  Procedures  . POCT glycosylated hemoglobin (Hb A1C)   Patient understands we will be in touch with him in the near future regarding these results.  Note: This document was prepared using Dragon voice recognition software and may include unintentional dictation errors.

## 2016-02-11 NOTE — Patient Instructions (Signed)
Diabetes and Exercise °Exercising regularly is important. It is not just about losing weight. It has many health benefits, such as: °· Improving your overall fitness, flexibility, and endurance. °· Increasing your bone density. °· Helping with weight control. °· Decreasing your body fat. °· Increasing your muscle strength. °· Reducing stress and tension. °· Improving your overall health. °People with diabetes who exercise gain additional benefits because exercise: °· Reduces appetite. °· Improves the body's use of blood sugar (glucose). °· Helps lower or control blood glucose. °· Decreases blood pressure. °· Helps control blood lipids (such as cholesterol and triglycerides). °· Improves the body's use of the hormone insulin by: °¨ Increasing the body's insulin sensitivity. °¨ Reducing the body's insulin needs. °· Decreases the risk for heart disease because exercising: °¨ Lowers cholesterol and triglycerides levels. °¨ Increases the levels of good cholesterol (such as high-density lipoproteins [HDL]) in the body. °¨ Lowers blood glucose levels. °YOUR ACTIVITY PLAN  °Choose an activity that you enjoy, and set realistic goals. To exercise safely, you should begin practicing any new physical activity slowly, and gradually increase the intensity of the exercise over time. Your health care provider or diabetes educator can help create an activity plan that works for you. General recommendations include: °· Encouraging children to engage in at least 60 minutes of physical activity each day. °· Stretching and performing strength training exercises, such as yoga or weight lifting, at least 2 times per week. °· Performing a total of at least 150 minutes of moderate-intensity exercise each week, such as brisk walking or water aerobics. °· Exercising at least 3 days per week, making sure you allow no more than 2 consecutive days to pass without exercising. °· Avoiding long periods of inactivity (90 minutes or more). When you  have to spend an extended period of time sitting down, take frequent breaks to walk or stretch. °RECOMMENDATIONS FOR EXERCISING WITH TYPE 1 OR TYPE 2 DIABETES  °· Check your blood glucose before exercising. If blood glucose levels are greater than 240 mg/dL, check for urine ketones. Do not exercise if ketones are present. °· Avoid injecting insulin into areas of the body that are going to be exercised. For example, avoid injecting insulin into: °· The arms when playing tennis. °· The legs when jogging. °· Keep a record of: °· Food intake before and after you exercise. °· Expected peak times of insulin action. °· Blood glucose levels before and after you exercise. °· The type and amount of exercise you have done. °· Review your records with your health care provider. Your health care provider will help you to develop guidelines for adjusting food intake and insulin amounts before and after exercising. °· If you take insulin or oral hypoglycemic agents, watch for signs and symptoms of hypoglycemia. They include: °· Dizziness. °· Shaking. °· Sweating. °· Chills. °· Confusion. °· Drink plenty of water while you exercise to prevent dehydration or heat stroke. Body water is lost during exercise and must be replaced. °· Talk to your health care provider before starting an exercise program to make sure it is safe for you. Remember, almost any type of activity is better than none. °  °This information is not intended to replace advice given to you by your health care provider. Make sure you discuss any questions you have with your health care provider. °  °Document Released: 07/25/2003 Document Revised: 09/18/2014 Document Reviewed: 10/11/2012 °Elsevier Interactive Patient Education ©2016 [employer] Inc. ° °Blood Glucose Monitoring, Adult °Monitoring your blood   glucose (also know as blood sugar) helps you to manage your diabetes. It also helps you and your health care provider monitor your diabetes and determine how well your  treatment plan is working. °WHY SHOULD YOU MONITOR YOUR BLOOD GLUCOSE? °· It can help you understand how food, exercise, and medicine affect your blood glucose. °· It allows you to know what your blood glucose is at any given moment. You can quickly tell if you are having low blood glucose (hypoglycemia) or high blood glucose (hyperglycemia). °· It can help you and your health care provider know how to adjust your medicines. °· It can help you understand how to manage an illness or adjust medicine for exercise. °WHEN SHOULD YOU TEST? °Your health care provider will help you decide how often you should check your blood glucose. This may depend on the type of diabetes you have, your diabetes control, or the types of medicines you are taking. Be sure to write down all of your blood glucose readings so that this information can be reviewed with your health care provider. See below for examples of testing times that your health care provider may suggest. °Type 1 Diabetes °· Test at least 2 times per day if your diabetes is well controlled, if you are using an insulin pump, or if you perform multiple daily injections. °· If your diabetes is not well controlled or if you are sick, you may need to test more often. °· It is a good idea to also test: °¨ Before every insulin injection. °¨ Before and after exercise. °¨ Between meals and 2 hours after a meal. °¨ Occasionally between 2:00 a.m. and 3:00 a.m. °Type 2 Diabetes °· If you are taking insulin, test at least 2 times per day. However, it is best to test before every insulin injection. °· If you take medicines by mouth (orally), test 2 times a day. °· If you are on a controlled diet, test once a day. °· If your diabetes is not well controlled or if you are sick, you may need to monitor more often. °HOW TO MONITOR YOUR BLOOD GLUCOSE °Supplies Needed °· Blood glucose meter. °· Test strips for your meter. Each meter has its own strips. You must use the strips that go with  your own meter. °· A pricking needle (lancet). °· A device that holds the lancet (lancing device). °· A journal or log book to write down your results. °Procedure °· Wash your hands with soap and water. Alcohol is not preferred. °· Prick the side of your finger (not the tip) with the lancet. °· Gently milk the finger until a small drop of blood appears. °· Follow the instructions that come with your meter for inserting the test strip, applying blood to the strip, and using your blood glucose meter. °Other Areas to Get Blood for Testing °Some meters allow you to use other areas of your body (other than your finger) to test your blood. These areas are called alternative sites. The most common alternative sites are: °· The forearm. °· The thigh. °· The back area of the lower leg. °· The palm of the hand. °The blood flow in these areas is slower. Therefore, the blood glucose values you get may be delayed, and the numbers are different from what you would get from your fingers. Do not use alternative sites if you think you are having hypoglycemia. Your reading will not be accurate. Always use a finger if you are having hypoglycemia. Also, if you cannot   if you cannot feel your lows (hypoglycemia unawareness), always use your fingers for your blood glucose checks. ADDITIONAL TIPS FOR GLUCOSE MONITORING  Do not reuse lancets.  Always carry your supplies with you.  All blood glucose meters have a 24-hour "hotline" number to call if you have questions or need help.  Adjust (calibrate) your blood glucose meter with a control solution after finishing a few boxes of strips. BLOOD GLUCOSE RECORD KEEPING It is a good idea to keep a daily record or log of your blood glucose readings. Most glucose meters, if not all, keep your glucose records stored in the meter. Some meters come with the ability to download your records to your home computer. Keeping a record of your blood glucose readings is especially helpful if you are  wanting to look for patterns. Make notes to go along with the blood glucose readings because you might forget what happened at that exact time. Keeping good records helps you and your health care provider to work together to achieve good diabetes management.    This information is not intended to replace advice given to you by your health care provider. Make sure you discuss any questions you have with your health care provider.   Document Released: 05/07/2003 Document Revised: 05/25/2014 Document Reviewed: 09/26/2012 Elsevier Interactive Patient Education [employer]2016 Elsevier Inc.

## 2016-02-11 NOTE — Progress Notes (Signed)
Subjective:    Chief Complaint  Patient presents with  . Diabetes    HPI: Jose Munoz is a 65 y.o. male who presents to [place] at [facility] today for f/up labs/ discuss med changes etc.      All lab values reviewed in full with patient today and all questions answered.  DM-  lost 30 lbs in past 3 months---> FBS at home been running- not checking after a couple of times and getting error messages on his meter, so he never checked them.  He is not exercising per my recommendations last office visit.   Patient denies any major changes in his diet or activity level which may have caused his weight loss.   - Patient still remains asymptomatic.  More thristy maybe than usual but otherwise no complaints.  No vis changes, sob, CP, dizziness, Feeling great.  No polyuria.  Denies numbness tingling in extremities. Denies any visual change   OA:  mobic helping a lot with jt pain that we started last OV. Very pleased  Quit drinking: since last being seen.  No more alcohol.  Not exercising besides golf couple times per week- they all ride the course.   Smoking: Patient is not interested in quitting once again.   He still about 1 pack per day.  He did not tolerate Chantix ( felt like he wanted to jump off bridge)  in the past and is not ready for a prescription for nicotine patchs or wellbutrin or other etc.    Past Medical History:  Diagnosis Date  . Diabetes mellitus without complication ([place])   . Essential hypertension 10/16/2015  . Hyperlipidemia   . OA (osteoarthritis) of hip 11/05/2015  . Obese 10/16/2015  . Smoking   . Vitamin D deficiency 11/05/2015    History reviewed. No pertinent surgical history.  Family History  Problem Relation Age of Onset  . Diabetes Mother   . Congestive Heart Failure Mother   . Cancer Sister     breast  . Cancer Sister     cervical  . Heart attack Brother     History  Drug Use No  ,  History  Alcohol Use  . 1.8  oz/week  . 1 Glasses of wine, 2 Shots of liquor per week  ,  History  Smoking Status  . Current Every Day Smoker  . Packs/day: 1.00  . Types: Cigarettes  Smokeless Tobacco  . Never Used  ,  History  Sexual Activity  . Sexual activity: No    Current Outpatient Prescriptions on File Prior to Visit  Medication Sig Dispense Refill  . aspirin 81 MG tablet Take 81 mg by mouth daily.    . Blood Glucose Monitoring Suppl (BLOOD GLUCOSE MONITOR SYSTEM) w/Device KIT Use as directed 1 each 0  . Glucose Blood (BLOOD GLUCOSE TEST STRIPS) STRP Check blood sugars every morning and 2 hours after largest meal 200 each 11  . meloxicam (MOBIC) 15 MG tablet 1/2- 1 tab po daily for pain 90 tablet 1  . simvastatin (ZOCOR) 40 MG tablet Take 1 tablet by mouth daily.  0  . Vitamin D, Ergocalciferol, (DRISDOL) 50000 units CAPS capsule Take 1 capsule (50,000 Units total) by mouth every 7 (seven) days. 12 capsule 4   No current facility-administered medications on file prior to visit.     No Known Allergies   Review of Systems:  General:  Denies fever, chills, appetite changes, unexplained weight loss.  Respiratory: Denies SOB, DOE, cough, wheezing.  Cardiovascular: Denies chest pain, palpitations.  Gastrointestinal: Denies nausea, vomiting, diarrhea, abdominal pain.  Genitourinary: Denies dysuria, increased frequency, flank pain. Endocrine: Denies hot or cold intolerance, polyuria. + polydipsia Musculoskeletal: Denies joint swelling, + arthralgias, + gait problems- pain in hips.  Skin: Denies pallor, rash, suspicious lesions.  Neurological: Denies dizziness, seizures, syncope, unexplained weakness, lightheadedness, numbness and headaches.  Psychiatric/Behavioral: Denies mood changes, sleep disturbances.   Objective:    Blood pressure 100/68, pulse 103, height 5' 9.75" (1.772 m), weight 237 lb 14.4 oz (107.911 kg). Body mass index is 30.03 kg/m.  General: Well Developed, well nourished, and in  no acute distress.  HEENT: Normocephalic, atraumatic, pupils equal round reactive to light, neck supple, No carotid bruits no JVD Skin: Warm and dry, cap RF less 2 sec Cardiac: Regular rate and rhythm, S1, S2 WNL's, no murmurs rubs or gallops Respiratory: ECTA B/L, Not using accessory muscles, speaking in full sentences. NeuroM-Sk: Ambulates w/o assistance, moves ext * 4 w/o difficulty, sensation grossly intact.  Psych: A and O *3, judgement and insight good.   Recent Results (from the past 2160 hour(s))  POCT glycosylated hemoglobin (Hb A1C)     Status: Abnormal   Collection Time: 02/11/16  8:34 AM  Result Value Ref Range   Hemoglobin A1C 11.9     Impression and Recommendations:     1. Diabetes mellitus without complication ([place])   2. Essential hypertension   3. Hyperlipidemia   4. Smoking   5. Primary osteoarthritis of both hips   6. Vitamin D deficiency   7. smoking cessation counseling    - Med changes as below.    - Referral to Endo since we are unable to get it under good control and pt declines insulin.  - Needs to check blood pressures and blood sugars regularly. Fasting blood sugar and 2 hour postprandial. Keep a log- bring in next office visit  - Strict dietary and lifestyle modifications discussed. Patient admits to being very lax on this.  - Continue other medications  - Patient still declines smoking cessation  Return in about 3 months (around 05/12/2016) for Follow-up of current medical issues; come in sooner if concerns/ questions.    Orders Placed This Encounter  Procedures  . COMPLETE METABOLIC PANEL WITH GFR  . Lipase  . Amylase  . Ambulatory referral to Endocrinology  . POCT glycosylated hemoglobin (Hb A1C)    New Prescriptions   LISINOPRIL (PRINIVIL,ZESTRIL) 5 MG TABLET    Take 1 tablet (5 mg total) by mouth daily.   SITAGLIPTIN (JANUVIA) 100 MG TABLET    Take 1 tablet (100 mg total) by mouth daily.   Modified Medications   Modified  Medication Previous Medication   ALPRAZOLAM (XANAX) 0.5 MG TABLET ALPRAZolam (XANAX) 0.5 MG tablet      Take 1 tablet (0.5 mg total) by mouth as needed for anxiety.    Take 1 tablet (0.5 mg total) by mouth at bedtime as needed for anxiety.   CANAGLIFLOZIN-METFORMIN HCL (INVOKAMET) 50-1000 MG TABS INVOKAMET 50-1000 MG TABS      Take 1 tablet by mouth 2 (two) times daily.    take 1 tablet by mouth twice a day   Discontinued Medications   JANUVIA 50 MG TABLET    take 1 tablet by mouth once daily   LISINOPRIL (PRINIVIL,ZESTRIL) 10 MG TABLET    Take 1 tablet by mouth daily.    Gross side effects, risk and benefits, and alternatives  of medications discussed with patient.  Patient is aware that all medications have potential side effects and we are unable to predict every sideeffect or drug-drug interaction that may occur.  Expresses verbal understanding and consents to current therapy plan and treatment regiment.  Note: This document was prepared using Dragon voice recognition software and may include unintentional dictation errors.

## 2016-02-12 LAB — COMPLETE METABOLIC PANEL WITH GFR
ALT: 20 U/L (ref 9–46)
AST: 16 U/L (ref 10–35)
Albumin: 4.3 g/dL (ref 3.6–5.1)
Alkaline Phosphatase: 72 U/L (ref 40–115)
BUN: 13 mg/dL (ref 7–25)
CALCIUM: 9.6 mg/dL (ref 8.6–10.3)
CO2: 17 mmol/L — ABNORMAL LOW (ref 20–31)
CREATININE: 0.72 mg/dL (ref 0.70–1.25)
Chloride: 94 mmol/L — ABNORMAL LOW (ref 98–110)
GFR, Est African American: >89 mL/min (ref >=60)
GFR, Est Non African American: >89 mL/min (ref >=60)
Glucose, Bld: 258 mg/dL — ABNORMAL HIGH (ref 65–99)
Potassium: 4.9 mmol/L (ref 3.5–5.3)
Sodium: 132 mmol/L — ABNORMAL LOW (ref 135–146)
Total Bilirubin: 0.5 mg/dL (ref 0.2–1.2)
Total Protein: 7.4 g/dL (ref 6.1–8.1)

## 2016-02-12 LAB — AMYLASE: Amylase: 40 U/L (ref 0–105)

## 2016-02-12 LAB — LIPASE: LIPASE: 34 U/L (ref 7–60)

## 2016-02-17 ENCOUNTER — Telehealth: Payer: Self-pay | Admitting: Family Medicine

## 2016-02-17 NOTE — Telephone Encounter (Signed)
Please call pt to see if he is checking his FBS and 2 hr PP.  If they are not improving significantly from prior levels- he will need to f/up with me much sooner than planned.

## 2016-02-18 NOTE — Telephone Encounter (Signed)
Pt states that he is not checking blood sugars yet because he cannot get his machine to work properly.  Gave pt appt for 02/21/2016 at 2pm for nurse visit only for flu shot and for education of CBG machine use.  Jose Munoz. Kairyn Olmeda, CMA

## 2016-02-18 NOTE — Telephone Encounter (Signed)
Per Dr. Sharee Holsterpalski, advised pt to have his pharmacy counsel/educate him on glucose monitor use so that he can begin checking his blood sugars immediately.  Pt states that he has an appointment with endocrinology on 03/11/2016.  Tiajuana Amass. Nelson, CMA

## 2016-02-20 ENCOUNTER — Ambulatory Visit: Payer: Medicare Other

## 2016-02-21 ENCOUNTER — Ambulatory Visit (INDEPENDENT_AMBULATORY_CARE_PROVIDER_SITE_OTHER): Payer: Medicare Other

## 2016-02-21 VITALS — BP 126/82 | HR 98 | Temp 98.2°F

## 2016-02-21 DIAGNOSIS — Z23 Encounter for immunization: Secondary | ICD-10-CM | POA: Diagnosis not present

## 2016-03-11 ENCOUNTER — Ambulatory Visit: Payer: Medicare Other | Admitting: Endocrinology

## 2016-03-30 ENCOUNTER — Ambulatory Visit (INDEPENDENT_AMBULATORY_CARE_PROVIDER_SITE_OTHER): Payer: Medicare Other | Admitting: Endocrinology

## 2016-03-30 ENCOUNTER — Other Ambulatory Visit: Payer: Self-pay

## 2016-03-30 ENCOUNTER — Encounter: Payer: Self-pay | Admitting: Endocrinology

## 2016-03-30 VITALS — BP 128/78 | HR 108 | Ht 70.0 in | Wt 203.0 lb

## 2016-03-30 DIAGNOSIS — Z6829 Body mass index (BMI) 29.0-29.9, adult: Secondary | ICD-10-CM | POA: Diagnosis not present

## 2016-03-30 DIAGNOSIS — E1165 Type 2 diabetes mellitus with hyperglycemia: Secondary | ICD-10-CM

## 2016-03-30 LAB — BASIC METABOLIC PANEL
BUN: 18 mg/dL (ref 6–23)
CHLORIDE: 97 meq/L (ref 96–112)
CO2: 25 mEq/L (ref 19–32)
CREATININE: 0.81 mg/dL (ref 0.40–1.50)
Calcium: 10 mg/dL (ref 8.4–10.5)
GFR: 100.8 mL/min (ref >60.00)
GLUCOSE: 383 mg/dL — AB (ref 70–99)
POTASSIUM: 4.5 meq/L (ref 3.5–5.1)
Sodium: 133 mEq/L — ABNORMAL LOW (ref 135–145)

## 2016-03-30 MED ORDER — INSULIN GLARGINE 300 UNIT/ML ~~LOC~~ SOPN: 30.0000 [IU] | PEN_INJECTOR | Freq: Every day | SUBCUTANEOUS | 1 refills | Status: DC

## 2016-03-30 NOTE — Patient Instructions (Signed)
Check blood sugars on waking up  daily  Also check blood sugars about 2 hours after a meal and do this after different meals by rotation  Recommended blood sugar levels on waking up is 90-130 and about 2 hours after meal is 130-160  Please bring your blood sugar monitor to each visit, thank you  

## 2016-03-30 NOTE — Progress Notes (Signed)
Patient ID: Jose Munoz, male   DOB: 06-03-1950, 65 y.o.   MRN: [id number]           Reason for Appointment: Consultation for Type 2 Diabetes  Referring physician: Opalski   History of Present Illness:          Date of diagnosis of type 2 diabetes mellitus:        Background history:   He thinks his diabetes was relatively easier to control and the onset even though he had initial symptoms of thirst and weight loss He had done very well with his diet and lost a significant amount of weight and was approved to control his diabetes without medications for a few years Subsequently he started with oral medications including metformin, Actos and Amaryl He thinks his blood sugars were well controlled for a few years  Recent history:        He thinks his blood sugars started getting higher progressively over the last year. He had an A1c of 10% when seen by his new physician in June His treatment regimen was changed to [place] and Actoplusmet but his blood sugars continued to be poorly controlled  Non-insulin hypoglycemic drugs the patient is taking are: Januvia 100 mg daily, Janumet 50/1000 twice a day  Current management, blood sugar patterns and problems identified:  He was started on Invokamet instead of Actoplusmet in 9/17 when his A1c was 11.9 and he had refused considering insulin.  He has checked his blood sugars since then only sporadically and blood sugars are usually over 200 and generally not under 250 fasting  He may only occasionally check readings later in the day, does not know what monitor he is using.  Glucose is 328 after lunch today in the office  Despite his high sugars he does not complain of any excessive thirst or urination, no fatigue  He has lost 34 pounds since June           Side effects from medications have been: None  Compliance with the medical regimen: Fair  Glucose monitoring:  done less than 1 times a day         Glucometer: ?  Blood Glucose  readings as above  Self-care: The diet that the patient has been following is: tries to limit sweets and drinks with sugar .     Meal times are:  Breakfast is at 10 AM  Typical meal intake: Breakfast is Bagel  or oatmeal.  Lunch is a sandwich, has various snacks              Dietician visit, most recent:20 yrs               Exercise:  may walk on treadmill irregularly, averaging 2/7 days a week  Weight history:  Wt Readings from Last 3 Encounters:  03/30/16 203 lb (92.1 kg)  02/11/16 207 lb 12.8 oz (94.3 kg)  11/05/15 237 lb 14.4 oz (107.9 kg)    Glycemic control:   Lab Results  Component Value Date   HGBA1C 11.9 02/11/2016   HGBA1C 10.0 (H) 10/18/2015   Lab Results  Component Value Date   MICROALBUR 104.0 05/04/2014   LDLCALC 60 10/18/2015   CREATININE 0.72 02/11/2016   No results found for: [facility]       Medication List       Accurate as of 03/30/16  2:36 PM. Always use your most recent med list.          ALPRAZolam 0.5 MG  tablet Commonly known as:  XANAX Take 1 tablet (0.5 mg total) by mouth as needed for anxiety.   aspirin 81 MG tablet Take 81 mg by mouth daily.   Blood Glucose Monitor System w/Device Kit Use as directed   BLOOD GLUCOSE TEST STRIPS Strp Check blood sugars every morning and 2 hours after largest meal   INVOKAMET 50-1000 MG Tabs Generic drug:  Canagliflozin-Metformin HCl Take 1 tablet by mouth 2 (two) times daily.   lisinopril 5 MG tablet Commonly known as:  PRINIVIL,ZESTRIL Take 1 tablet (5 mg total) by mouth daily.   meloxicam 15 MG tablet Commonly known as:  MOBIC 1/2- 1 tab po daily for pain   simvastatin 40 MG tablet Commonly known as:  ZOCOR Take 1 tablet by mouth daily.   sitaGLIPtin 100 MG tablet Commonly known as:  JANUVIA Take 1 tablet (100 mg total) by mouth daily.   Vitamin D (Ergocalciferol) 50000 units Caps capsule Commonly known as:  DRISDOL Take 1 capsule (50,000 Units total) by mouth every 7  (seven) days.       Allergies: No Known Allergies  Past Medical History:  Diagnosis Date  . Diabetes mellitus without complication ([place])   . Essential hypertension 10/16/2015  . Hyperlipidemia   . OA (osteoarthritis) of hip 11/05/2015  . Obese 10/16/2015  . Smoking   . Vitamin D deficiency 11/05/2015    No past surgical history on file.  Family History  Problem Relation Age of Onset  . Diabetes Mother   . Congestive Heart Failure Mother   . Cancer Sister     breast  . Cancer Sister     cervical  . Heart attack Brother     Social History:  reports that he has been smoking Cigarettes.  He has been smoking about 1.00 pack per day. He has never used smokeless tobacco. He reports that he drinks about 1.8 oz of alcohol per week . He reports that he does not use drugs.   Review of Systems  Constitutional: Positive for weight loss. Negative for malaise.  HENT: Negative for headaches.   Eyes: Negative for blurred vision.  Respiratory: Negative for shortness of breath.   Cardiovascular: Negative for leg swelling.  Endocrine: Negative for fatigue, polydipsia and erectile dysfunction.  Genitourinary: Positive for nocturia.       2x  Musculoskeletal: Positive for joint pain.       He has been in the side of his upper thigh, better with meloxicam given by PCP  Skin: Negative for rash.  Neurological: Negative for numbness, tingling and balance difficulty.  Psychiatric/Behavioral: Negative for depressed mood.     Lipid history: Has not been on any medications for lipids    Lab Results  Component Value Date   CHOL 131 10/18/2015   HDL 43 10/18/2015   LDLCALC 60 10/18/2015   TRIG 142 10/18/2015   CHOLHDL 3.0 10/18/2015           Hypertension:Minimal, is being given lisinopril 5 mg by PCP  Most recent eye exam was 8/17, reportedly not having retinopathy but is being watched for glaucoma  Most recent foot exam: 11/17    LABS:  No visits with results within 1 Week(s)  from this visit.  Latest known visit with results is:  Office Visit on 02/11/2016  Component Date Value Ref Range Status  . Hemoglobin A1C 02/11/2016 11.9   Final  . Sodium 02/12/2016 132* 135 - 146 mmol/L Final  . Potassium 02/12/2016 4.9  3.5 -  5.3 mmol/L Final  . Chloride 02/12/2016 94* 98 - 110 mmol/L Final  . CO2 02/12/2016 17* 20 - 31 mmol/L Final  . Glucose, Bld 02/12/2016 258* 65 - 99 mg/dL Final  . BUN 02/12/2016 13  7 - 25 mg/dL Final  . Creat 02/12/2016 0.72  0.70 - 1.25 mg/dL Final   Comment:   For patients > or = 67 years of age: The upper reference limit for Creatinine is approximately 13% higher for people identified as African-American.     . Total Bilirubin 02/12/2016 0.5  0.2 - 1.2 mg/dL Final  . Alkaline Phosphatase 02/12/2016 72  40 - 115 U/L Final  . AST 02/12/2016 16  10 - 35 U/L Final  . ALT 02/12/2016 20  9 - 46 U/L Final  . Total Protein 02/12/2016 7.4  6.1 - 8.1 g/dL Final  . Albumin 02/12/2016 4.3  3.6 - 5.1 g/dL Final  . Calcium 02/12/2016 9.6  8.6 - 10.3 mg/dL Final  . GFR, Est African American 02/12/2016 >89  >=60 mL/min Final  . GFR, Est Non African American 02/12/2016 >89  >=60 mL/min Final  . Lipase 02/12/2016 34  7 - 60 U/L Final  . Amylase 02/12/2016 40  0 - 105 U/L Final    Physical Examination:  BP 128/78   Pulse (!) 108   Ht _0  (1.778 m)   Wt 203 lb (92.1 kg)   BMI 29.13 kg/m   GENERAL:         Patient has generalized obesity.   HEENT:         Eye exam shows normal external appearance. Fundus exam shows no Obvious retinopathy, Not well focused.  Oral exam shows normal mucosa .  NECK:   There is no lymphadenopathy Thyroid is not enlarged and no nodules felt.  Carotids are normal to palpation and no bruit heard LUNGS:         Chest is symmetrical. Lungs are clear to auscultation.Marland Kitchen   HEART:         Heart sounds:  S1 and S2 are normal. No murmur or click heard., no S3 or S4.   ABDOMEN:   There is no distention present. Liver and  spleen are not palpable. No other mass or tenderness present.   NEUROLOGICAL:   Ankle jerks are absent bilaterally.    Diabetic Foot Exam - Simple   Simple Foot Form Diabetic Foot exam was performed with the following findings:  Yes 03/30/2016  2:35 PM  Visual Inspection No deformities, no ulcerations, no other skin breakdown bilaterally:  Yes See comments:  Yes Sensation Testing Intact to touch and monofilament testing bilaterally:  Yes Pulse Check Posterior Tibialis and Dorsalis pulse intact bilaterally:  Yes See comments:  Yes Comments Mild generalized calluses and dryness present on the soles Left dorsalis pedis and right posterior tibialis only felt, normal           MUSCULOSKELETAL:  There is no swelling or deformity of the peripheral joints.  EXTREMITIES:     There is no edema.  No skin lesions present..    ASSESSMENT:  Diabetes type 2, uncontrolled He appears to had progressive rise in his blood sugars with A1c nearly 12% recently Only on Invokamet and Januvia Does not appear to have benefited from using Invokana in addition to his previous medications Currently is not monitoring his blood sugar much and not very motivated to improve his lifestyle He is also very concerned about the cost of his medications in  the doughnut hole     Complications of diabetes: None evident  Smoking history: Encouraged him to consider reducing and quitting this, he needs to follow-up with PCP also for guidance  PLAN:     Discussed with the patient that currently because of his Significant hyperglycemia with the drugs he should go on insulin.  This is also probably the most economical choice for him currently  Discussed the nature of physiological insulin secretion including basal and mealtime insulin.  For simplicity will start him on basal insulin alone with Toujeo.  He can get a trial box of insulin pens with the watcher given  He will be discussing how to dose insulin and inject  with the nurse educator tomorrow.  He will start with 14 units of TOUJEO once a day in the mornings and adjust every 5-6 days by 2 units to get morning sugars.at least under 150  Since he is going to be traveling the rest of the month will start him on mealtime insulin on his return  Mean time can try and increase his INVOKANA to 300 mg a day as long as renal function normal today  He can stop Januvia when finished as it is unlikely to be helping  He needs to start regular glucose monitoring either fasting or postprandial at least once a day  Discussed blood sugar targets at various times  Consultation with dietitian  More regular exercise with walking at least 3-4 times a week  Follow-up in 1 month, reassess control with fructosamine at that time  Patient Instructions  Check blood sugars on waking up daily  Also check blood sugars about 2 hours after a meal and do this after different meals by rotation  Recommended blood sugar levels on waking up is 90-130 and about 2 hours after meal is 130-160  Please bring your blood sugar monitor to each visit, thank you   Total visit time = 60 minutes including counseling  Consultation note has been sent to the referring physician  [facility] 03/30/2016, 2:36 PM   Note: This office note was prepared with Dragon voice recognition system technology. Any transcriptional errors that result from this process are unintentional.

## 2016-03-31 ENCOUNTER — Encounter: Payer: Medicare Other | Attending: Endocrinology | Admitting: Nutrition

## 2016-03-31 ENCOUNTER — Telehealth: Payer: Self-pay

## 2016-03-31 DIAGNOSIS — Z713 Dietary counseling and surveillance: Secondary | ICD-10-CM | POA: Diagnosis not present

## 2016-03-31 DIAGNOSIS — Z794 Long term (current) use of insulin: Secondary | ICD-10-CM | POA: Diagnosis not present

## 2016-03-31 DIAGNOSIS — E119 Type 2 diabetes mellitus without complications: Secondary | ICD-10-CM | POA: Diagnosis not present

## 2016-03-31 MED ORDER — INSULIN PEN NEEDLE 32G X 4 MM MISC: 1.0000 | Freq: Every day | 3 refills | Status: DC

## 2016-03-31 MED ORDER — GLUCOSE BLOOD VI STRP: ORAL_STRIP | 12 refills | Status: DC

## 2016-03-31 NOTE — Telephone Encounter (Signed)
Pt needs us to call in the rx for the toujeo needles and test strips for his contour to be sent to [facility] not [facility]

## 2016-04-01 ENCOUNTER — Other Ambulatory Visit: Payer: Self-pay

## 2016-04-01 ENCOUNTER — Telehealth: Payer: Self-pay | Admitting: Endocrinology

## 2016-04-01 MED ORDER — CANAGLIFLOZIN-METFORMIN HCL 50-1000 MG PO TABS: 1.0000 | ORAL_TABLET | Freq: Two times a day (BID) | ORAL | 3 refills | Status: DC

## 2016-04-01 MED ORDER — INSULIN GLARGINE 300 UNIT/ML ~~LOC~~ SOPN: 30.0000 [IU] | PEN_INJECTOR | Freq: Every day | SUBCUTANEOUS | 1 refills | Status: DC

## 2016-04-01 MED ORDER — GLUCOSE BLOOD VI STRP: ORAL_STRIP | 12 refills | Status: DC

## 2016-04-01 NOTE — Telephone Encounter (Signed)
Pharmacy have not received medication    glucose blood (BAYER CONTOUR NEXT TEST) test  Insulin Glargine (TOUJEO SOLOSTAR) 300 UNIT/ML   Canagliflozin-Metformin HCl (INVOKAMET) 50-1000 MG TABS  [facility]  [facility] [address] - [place], [place] - [address] [phone] (Phone) [phone] (Fax)

## 2016-04-01 NOTE — Telephone Encounter (Signed)
[facility]

## 2016-04-01 NOTE — Telephone Encounter (Signed)
Gave verbal for the test strips, toujeo, pen needles,   Needs to call in invokamet

## 2016-04-02 ENCOUNTER — Other Ambulatory Visit: Payer: Self-pay

## 2016-04-02 MED ORDER — CANAGLIFLOZIN-METFORMIN HCL 50-1000 MG PO TABS: 1.0000 | ORAL_TABLET | Freq: Two times a day (BID) | ORAL | 3 refills | Status: DC

## 2016-04-02 NOTE — Telephone Encounter (Signed)
Patient need a refill of his Invokamet [phone]  Also his insurance will not cover the toujeo but will cover the [place].  Send to   [facility] [address] - [place] [id number] [phone] (Phone) [phone] (Fax)

## 2016-04-02 NOTE — Telephone Encounter (Signed)
He was told to get a free box of Toujeo with the coupon I gave him, please check if he has already done this.  He can bypass insurance with this otherwise [name] will be significant out-of-pocket expense.

## 2016-04-03 ENCOUNTER — Other Ambulatory Visit: Payer: Self-pay

## 2016-04-03 MED ORDER — CANAGLIFLOZIN-METFORMIN HCL ER 150-1000 MG PO TB24
2.0000 | ORAL_TABLET | Freq: Every day | ORAL | 3 refills | Status: DC
Start: 2016-04-03 — End: 2016-04-30

## 2016-04-03 NOTE — Progress Notes (Signed)
We reviewed how the insulin in his body works, and discussed the fact that his body is probably running out of insulin. He was shown how to use the [employer], and given a starter kit with Nano pen needles.  He attached the needle and drew up the correct dose of 14u.  He did not inject due to not having the insulin.  We reviewed where to inject, and the need to rotate sites.  He reported good understanding of this. We also reviewed the need to test blood sugars and to increase the dose q 5-6 days by 2 units until his FBSs are under 150.  Written instructions were given for this, and he re verbalized this dosage correctly. We also discussed low blood sugars--symptoms and treatments, and he reported good understanding of this as well.   He had no final questions.  He reported feeling more relaxed about this process.

## 2016-04-03 NOTE — Telephone Encounter (Signed)
Spoke with patient and ordered new prescription for him

## 2016-04-03 NOTE — Patient Instructions (Signed)
TAke 14u of Toujeo once a day Wait 5-6 days.  If morning blood sugars are still over 150, increase the dose by 2 units. Call if blood sugars drop low.

## 2016-04-03 NOTE — Telephone Encounter (Signed)
Invokamet XR 150/1000, 2 tablets daily.  Has he started the Toujeo?

## 2016-04-03 NOTE — Telephone Encounter (Signed)
Spoke with patient and he has questions about medication- please advise what dose of invokamet he should be taking

## 2016-04-06 NOTE — Telephone Encounter (Signed)
I contacted the pharmacy. Pharmacist stated the Invokamet refill had been received and process correctly. He did state the medicare form for the test strips needed to be completed and faxed back. I obtained the form and had Dr. Lucianne MussKumar sign off on it and [facility]

## 2016-04-06 NOTE — Telephone Encounter (Signed)
[facility] called and said that Pt is very upset with us because since Wednesday he has been trying to get his test strips and Invokamet and they haven't received the correct script for the test strips yet.  They request a call back from someone to fix this problem. [facility] on [address]

## 2016-04-24 ENCOUNTER — Other Ambulatory Visit (INDEPENDENT_AMBULATORY_CARE_PROVIDER_SITE_OTHER): Payer: Medicare Other

## 2016-04-24 DIAGNOSIS — E1165 Type 2 diabetes mellitus with hyperglycemia: Secondary | ICD-10-CM | POA: Diagnosis not present

## 2016-04-24 LAB — COMPREHENSIVE METABOLIC PANEL
ALBUMIN: 4.5 g/dL (ref 3.5–5.2)
ALT: 21 U/L (ref 0–53)
AST: 15 U/L (ref 0–37)
Alkaline Phosphatase: 67 U/L (ref 39–117)
BUN: 13 mg/dL (ref 6–23)
CHLORIDE: 100 meq/L (ref 96–112)
CO2: 26 meq/L (ref 19–32)
CREATININE: 0.76 mg/dL (ref 0.40–1.50)
Calcium: 9.8 mg/dL (ref 8.4–10.5)
GFR: 108.47 mL/min (ref >60.00)
GLUCOSE: 263 mg/dL — AB (ref 70–99)
POTASSIUM: 4.7 meq/L (ref 3.5–5.1)
SODIUM: 136 meq/L (ref 135–145)
Total Bilirubin: 0.4 mg/dL (ref 0.2–1.2)
Total Protein: 7.9 g/dL (ref 6.0–8.3)

## 2016-04-25 LAB — FRUCTOSAMINE: Fructosamine: 380 umol/L — ABNORMAL HIGH (ref 0–285)

## 2016-04-27 ENCOUNTER — Encounter: Payer: Self-pay | Admitting: Endocrinology

## 2016-04-27 ENCOUNTER — Ambulatory Visit (INDEPENDENT_AMBULATORY_CARE_PROVIDER_SITE_OTHER): Payer: Medicare Other | Admitting: Endocrinology

## 2016-04-27 ENCOUNTER — Other Ambulatory Visit: Payer: Self-pay

## 2016-04-27 VITALS — BP 122/70 | HR 90 | Ht 70.0 in | Wt 204.0 lb

## 2016-04-27 DIAGNOSIS — Z794 Long term (current) use of insulin: Secondary | ICD-10-CM | POA: Diagnosis not present

## 2016-04-27 DIAGNOSIS — E1165 Type 2 diabetes mellitus with hyperglycemia: Secondary | ICD-10-CM

## 2016-04-27 MED ORDER — INSULIN LISPRO 100 UNIT/ML (KWIKPEN): PEN_INJECTOR | SUBCUTANEOUS | 11 refills | Status: DC

## 2016-04-27 MED ORDER — INSULIN GLARGINE 300 UNIT/ML ~~LOC~~ SOPN: 24.0000 [IU] | PEN_INJECTOR | Freq: Every day | SUBCUTANEOUS | 3 refills | Status: DC

## 2016-04-27 NOTE — Progress Notes (Signed)
Patient ID: Jose Munoz, male   DOB: 28-Dec-1950, 65 y.o.   MRN: [id number]           Reason for Appointment: Consultation for Type 2 Diabetes  Referring physician: Opalski   History of Present Illness:          Date of diagnosis of type 2 diabetes mellitus:        Background history:   He thinks his diabetes was relatively easier to control and the onset even though he had initial symptoms of thirst and weight loss He had done very well with his diet and lost a significant amount of weight and was approved to control his diabetes without medications for a few years Subsequently he started with oral medications including metformin, Actos and Amaryl He thinks his blood sugars were well controlled for a few years initially but started getting higher progressively since 2016.  Recent history:         He had an A1c of 10% when seen by his new physician in June His treatment regimen was changed to [place] but his blood sugars continued to be poorly controlled  Non-insulin hypoglycemic drugs the patient is taking are:Invokamet 150/1000 twice a day  Current management, blood sugar patterns and problems identified:  He was started on Toujeo insulin in 11/17 because of significant hyperglycemia  He was also continued on Invokamet but was told to increase the dose to 150/1000 off the Invokamet XR which has done  Did not bring his monitor for download and not clear what brand he is using  Not clear if his blood sugars are any better; also he did not increase the insulin as directed and continues on 14 units only  He says he takes his blood sugar test in the morning after drinking 3 cups of coffee and cream, lab glucose was also high at 263  He does not feel any different but his weight loss has leveled off  He still has questions about the insulin and blood sugar targets.  He is concerned about the cost of his medications and insulin           Side effects from  medications have been: None  Compliance with the medical regimen: Fair  Glucose monitoring:  done less than 1 times a day         Glucometer: ?  Contour  Blood sugars by recall:  PRE-MEAL Fasting Lunch Dinner Bedtime Overall  Glucose range: 215-290  330 320   Mean/median:         Self-care: The diet that the patient has been following is: tries to limit sweets and drinks with sugar .     Meal times are:  Breakfast is at 10 AM  Typical meal intake: Breakfast is Bagel  or oatmeal.  Lunch is a sandwich, has various snacks              Dietician visit, most recent:20 Years ago CDE visit: 11/17               Exercise:  may walk on treadmill irregularly, averaging 2/7 days a week  Weight history:  Wt Readings from Last 3 Encounters:  04/27/16 204 lb (92.5 kg)  03/30/16 203 lb (92.1 kg)  02/11/16 207 lb 12.8 oz (94.3 kg)    Glycemic control:   Lab Results  Component Value Date   HGBA1C 11.9 02/11/2016   HGBA1C 10.0 (H) 10/18/2015   Lab Results  Component Value Date  MICROALBUR 104.0 05/04/2014   LDLCALC 60 10/18/2015   CREATININE 0.76 04/24/2016   No results found for: [facility]       Medication List       Accurate as of 04/27/16 10:35 AM. Always use your most recent med list.          ALPRAZolam 0.5 MG tablet Commonly known as:  XANAX Take 1 tablet (0.5 mg total) by mouth as needed for anxiety.   aspirin 81 MG tablet Take 81 mg by mouth daily.   Blood Glucose Monitor System w/Device Kit Use as directed   Canagliflozin-Metformin HCl ER 272 013 0508 MG Tb24 Commonly known as:  INVOKAMET XR Take 2 tablets by mouth daily. In the morning   glucose blood test strip Commonly known as:  BAYER CONTOUR NEXT TEST Use to check blood sugar 2 times daily   Insulin Glargine 300 UNIT/ML Sopn Commonly known as:  TOUJEO SOLOSTAR Inject 30 Units into the skin daily.   Insulin Pen Needle 32G X 4 MM Misc 1 Syringe by Does not apply route daily.   lisinopril 5 MG  tablet Commonly known as:  PRINIVIL,ZESTRIL Take 1 tablet (5 mg total) by mouth daily.   meloxicam 15 MG tablet Commonly known as:  MOBIC 1/2- 1 tab po daily for pain   simvastatin 40 MG tablet Commonly known as:  ZOCOR Take 1 tablet by mouth daily.   sitaGLIPtin 100 MG tablet Commonly known as:  JANUVIA Take 1 tablet (100 mg total) by mouth daily.   Vitamin D (Ergocalciferol) 50000 units Caps capsule Commonly known as:  DRISDOL Take 1 capsule (50,000 Units total) by mouth every 7 (seven) days.       Allergies: No Known Allergies  Past Medical History:  Diagnosis Date  . Diabetes mellitus without complication ([place])   . Essential hypertension 10/16/2015  . Hyperlipidemia   . OA (osteoarthritis) of hip 11/05/2015  . Obese 10/16/2015  . Smoking   . Vitamin D deficiency 11/05/2015    No past surgical history on file.  Family History  Problem Relation Age of Onset  . Diabetes Mother   . Congestive Heart Failure Mother   . Cancer Sister     breast  . Cancer Sister     cervical  . Heart attack Brother     Social History:  reports that he has been smoking Cigarettes.  He has been smoking about 1.00 pack per day. He has never used smokeless tobacco. He reports that he drinks about 1.8 oz of alcohol per week . He reports that he does not use drugs.   Review of Systems   Lipid history: Has not been on any medications for lipids    Lab Results  Component Value Date   CHOL 131 10/18/2015   HDL 43 10/18/2015   LDLCALC 60 10/18/2015   TRIG 142 10/18/2015   CHOLHDL 3.0 10/18/2015           Hypertension:Minimal, is being given lisinopril 5 mg by PCP  Most recent eye exam was 8/17, reportedly not having retinopathy but is being watched for glaucoma  Most recent foot exam: 11/17  He is a smoker  LABS:  Lab on 04/24/2016  Component Date Value Ref Range Status  . Sodium 04/24/2016 136  135 - 145 mEq/L Final  . Potassium 04/24/2016 4.7  3.5 - 5.1 mEq/L Final  .  Chloride 04/24/2016 100  96 - 112 mEq/L Final  . CO2 04/24/2016 26  19 - 32 mEq/L Final  .  Glucose, Bld 04/24/2016 263* 70 - 99 mg/dL Final  . BUN 04/24/2016 13  6 - 23 mg/dL Final  . Creatinine, Ser 04/24/2016 0.76  0.40 - 1.50 mg/dL Final  . Total Bilirubin 04/24/2016 0.4  0.2 - 1.2 mg/dL Final  . Alkaline Phosphatase 04/24/2016 67  39 - 117 U/L Final  . AST 04/24/2016 15  0 - 37 U/L Final  . ALT 04/24/2016 21  0 - 53 U/L Final  . Total Protein 04/24/2016 7.9  6.0 - 8.3 g/dL Final  . Albumin 04/24/2016 4.5  3.5 - 5.2 g/dL Final  . Calcium 04/24/2016 9.8  8.4 - 10.5 mg/dL Final  . GFR 04/24/2016 108.47  >60.00 mL/min Final  . Fructosamine 04/25/2016 380* 0 - 285 umol/L Final   Comment: Published reference interval for apparently healthy subjects between age 72 and 72 is 51 - 285 umol/L and in a poorly controlled diabetic population is 228 - 563 umol/L with a mean of 396 umol/L.     Physical Examination:  BP 122/70   Pulse 90   Ht '5\' 10"'$  (1.778 m)   Wt 204 lb (92.5 kg)   SpO2 96%   BMI 29.27 kg/m    ASSESSMENT:  Diabetes type 2, uncontrolled  See history of present illness for detailed discussion of current diabetes management, blood sugar patterns and problems identified  His blood sugars are about the same despite increasing his Invokamet and starting basal insulin 14 units. He has not increase the dose however over the last 3-4 weeks as directed Also appears to have regularly high postprandial readings at night over 300 FRUCTOSAMINE is still high at 380   PLAN:     He will go up 10 units on his basal insulin  He will increase the Toujeo by 4 units every 3 days until fasting readings are below 130 and back off if below 100  Start 8 units of Humalog at suppertime or whichever his main meal is an keep postprandial readings from going over 180 or at least not rising over 50 mg.  Discussed in detail how mealtime insulin works and given him information on various  insulin types and onset and duration of actions.  All his questions were answered  He needs to start regular glucose monitoring with daily fasting and also 2 hours postprandial at least once a day  Discussed blood sugar targets both fasting and 2 hours after eating  Consultation with dietitian  More regular exercise with walking at least 3-4 times a week  Follow-up in 1 month, reassess control with A1c at that time  Stay on Invokamet XR  He will discuss with PCP whether he can stop lisinopril now since blood pressure readings are low normal with his Invokana  Also he will check to see what his insurance will cover next year  Patient Instructions  Check blood sugars on waking up  daily  Also check blood sugars about 2 hours after a meal and do this after different meals by rotation  Recommended blood sugar levels on waking up is 90-130 and about 2 hours after meal is 130-160  Please bring your blood sugar monitor to each visit, thank you  Humalog 8 UNITS JUST BEFORE THE DINNER and need to keep sugar from rising > 50 mg/dl      Counseling time on subjects discussed above is over 50% of today's 25 minute visit  Luane Rochon 04/27/2016, 10:35 AM   Note: This office note was prepared with Dragon voice recognition  system technology. Any transcriptional errors that result from this process are unintentional.

## 2016-04-27 NOTE — Patient Instructions (Signed)
Check blood sugars on waking up  daily  Also check blood sugars about 2 hours after a meal and do this after different meals by rotation  Recommended blood sugar levels on waking up is 90-130 and about 2 hours after meal is 130-160  Please bring your blood sugar monitor to each visit, thank you  Humalog 8 UNITS JUST BEFORE THE DINNER and need to keep sugar from rising > 50 mg/dl

## 2016-04-30 ENCOUNTER — Encounter: Payer: Self-pay | Admitting: Family Medicine

## 2016-04-30 ENCOUNTER — Ambulatory Visit (INDEPENDENT_AMBULATORY_CARE_PROVIDER_SITE_OTHER): Payer: Medicare Other | Admitting: Family Medicine

## 2016-04-30 VITALS — BP 93/63 | HR 98 | Ht 70.0 in | Wt 203.7 lb

## 2016-04-30 DIAGNOSIS — E663 Overweight: Secondary | ICD-10-CM

## 2016-04-30 DIAGNOSIS — E559 Vitamin D deficiency, unspecified: Secondary | ICD-10-CM

## 2016-04-30 DIAGNOSIS — F17200 Nicotine dependence, unspecified, uncomplicated: Secondary | ICD-10-CM

## 2016-04-30 DIAGNOSIS — E119 Type 2 diabetes mellitus without complications: Secondary | ICD-10-CM

## 2016-04-30 DIAGNOSIS — Z716 Tobacco abuse counseling: Secondary | ICD-10-CM

## 2016-04-30 DIAGNOSIS — F17210 Nicotine dependence, cigarettes, uncomplicated: Secondary | ICD-10-CM | POA: Diagnosis not present

## 2016-04-30 DIAGNOSIS — I10 Essential (primary) hypertension: Secondary | ICD-10-CM

## 2016-04-30 DIAGNOSIS — M160 Bilateral primary osteoarthritis of hip: Secondary | ICD-10-CM

## 2016-04-30 DIAGNOSIS — E782 Mixed hyperlipidemia: Secondary | ICD-10-CM

## 2016-04-30 MED ORDER — LISINOPRIL 10 MG PO TABS: 5.0000 mg | ORAL_TABLET | Freq: Every day | ORAL | 0 refills | Status: DC

## 2016-04-30 MED ORDER — MELOXICAM 15 MG PO TABS: ORAL_TABLET | ORAL | 1 refills | Status: DC

## 2016-04-30 NOTE — Progress Notes (Signed)
Impression and Recommendations:    1. Essential hypertension   2. Smoking   3. smoking cessation counseling   4. Primary osteoarthritis of both hips   5. Overweight (BMI 25.0-29.9)   6. Vitamin D deficiency   7. Mixed hyperlipidemia   8. Diabetes mellitus without complication ([place])      Vitamin D deficiency Cont supp- will check levels next OV in 32mo smoking cessation counseling Handouts provided after counseling done.  Failed Chantix in the past due to causing depression  Declines Buproprion.  Information on smokeless tobacco\raping given to patient.  - Follow up if he needs more support with quitting sooner than planned when necessary  Smoking 3-5 min counseling done  Overweight (BMI 25.0-29.9) Counseling done- continue weight loss  All questions answered  Handouts given     OA (osteoarthritis) of hip Exercise- walk on his treadmill 5 minutes daily, slowly work his way up to 30 minutes daily.  Mobic RF- r/b meds d/c pt  - Patient declines referral for ultrasound guided hip joint injections at this time. Pain is not that bad.  Hyperlipidemia Under good control. LFTs stable, continue meds.  Essential hypertension Half dose of lisinopril from 10 mg daily down to 5 mg daily.  Lifestyle changes such as dash diet and engaging in a regular exercise program discussed with patient.  Educational handouts provided  Ambulatory BP monitoring encouraged. Keep log and bring in next OV  Contact [facility] with any Q's/ concerns.  Diabetes mellitus [place] complication ([name] We will obtain eye exam for DM  - meds and mgt per Endo/ Dr KDwyane Deept will get meds/ supplies from them    Education and routine counseling performed. Handouts provided.   New Prescriptions   No medications on file    Modified Medications   Modified Medication Previous Medication   LISINOPRIL (PRINIVIL,ZESTRIL) 10 MG TABLET lisinopril (PRINIVIL,ZESTRIL) 10 MG tablet      Take  0.5 tablets (5 mg total) by mouth daily.    Take 10 mg by mouth daily.   MELOXICAM (MOBIC) 15 MG TABLET meloxicam (MOBIC) 15 MG tablet      1/2- 1 tab po daily for pain    1/2- 1 tab po daily for pain    Discontinued Medications   CANAGLIFLOZIN-METFORMIN HCL ER (INVOKAMET XR) [phone] MG TB24    Take 2 tablets by mouth daily. In the morning   LISINOPRIL (PRINIVIL,ZESTRIL) 5 MG TABLET    Take 1 tablet (5 mg total) by mouth daily.   SITAGLIPTIN (JANUVIA) 100 MG TABLET    Take 1 tablet (100 mg total) by mouth daily.     Return in about 3 months (around 07/29/2016) for remind pt needs yrly CPE in addition to chronic f/up.  The patient was counseled, risk factors were discussed, anticipatory guidance given.  Gross side effects, risk and benefits, and alternatives of medications discussed with patient.  Patient is aware that all medications have potential side effects and we are unable to predict every side effect or drug-drug interaction that may occur.  Expresses verbal understanding and consents to current therapy plan and treatment regimen.  Please see AVS handed out to patient at the end of our visit for further patient instructions/ counseling done pertaining to today's office visit.    Note: This document was prepared using Dragon voice recognition software and may include unintentional dictation errors.     Subjective:    Chief Complaint  Patient presents with  . Hyperlipidemia  .  Hypertension    HPI: Jose Munoz is a 65 y.o. male who presents to [facility] today for follow up for HTN.     HTN: Home BP readings have been running in the not checking  .   Pt has been tolerating meds well.  Taking as prescribed.  Denies HA, dizziness, CP, SOB, Visual changes, increasing pedal edema.    Preventitive Healthcare:  Exercise: no   Diet Pattern: fair  Salt Restriction: some   DM:  Recently seen by Dr Dwyane Dee.  Toujeo--> 24 and 8 humalog--> BS.  Appt  18th Jan--> has appt with nutritionist. invokomet- inc dose.   Last a1c 11.9 in Sept---> endo to check in Jan   tob abuse:  Kids in [place] bought him vape--> will start around [place]. Dr Redmond Pulling in past started pt on chantix. -- made him depressed.  Discussed buproprion--> pt doesn't think he needs it.  Quit drinking as much--> not everyday and maybe once wkly and only 1-2 now.     Patient Care Team    Relationship Specialty Notifications Start End  Mellody Dance, DO PCP - General Family Medicine  10/16/15      Wt Readings from Last 3 Encounters:  04/30/16 203 lb 11.2 oz (92.4 kg)  04/27/16 204 lb (92.5 kg)  03/30/16 203 lb (92.1 kg)    BP Readings from Last 3 Encounters:  04/30/16 93/63  04/27/16 122/70  03/30/16 128/78    Pulse Readings from Last 3 Encounters:  04/30/16 98  04/27/16 90  03/30/16 (!) 108    BMI Readings from Last 3 Encounters:  04/30/16 29.23 kg/m  04/27/16 29.27 kg/m  03/30/16 29.13 kg/m     Lab Results  Component Value Date   CREATININE 0.76 04/24/2016   BUN 13 04/24/2016   NA 136 04/24/2016   K 4.7 04/24/2016   CL 100 04/24/2016   CO2 26 04/24/2016    Lab Results  Component Value Date   CHOL 131 10/18/2015    Lab Results  Component Value Date   HDL 43 10/18/2015    Lab Results  Component Value Date   LDLCALC 60 10/18/2015    Lab Results  Component Value Date   TRIG 142 10/18/2015    Lab Results  Component Value Date   CHOLHDL 3.0 10/18/2015    No results found for: LDLDIRECT ===================================================================  Patient Active Problem List   Diagnosis Date Noted  . OA (osteoarthritis) of hip 11/05/2015  . Vitamin D deficiency 11/05/2015  . smoking cessation counseling 11/05/2015  . Essential hypertension 10/16/2015  . Diabetes mellitus without complication ([place]) 42/68/3419  . Hyperlipidemia 10/16/2015  . Smoking 10/16/2015  . Overweight (BMI 25.0-29.9) 10/16/2015     Past Medical History:  Diagnosis Date  . Diabetes mellitus without complication ([place])   . Essential hypertension 10/16/2015  . Hyperlipidemia   . OA (osteoarthritis) of hip 11/05/2015  . Obese 10/16/2015  . Smoking   . Vitamin D deficiency 11/05/2015    History reviewed. No pertinent surgical history.  Family History  Problem Relation Age of Onset  . Diabetes Mother   . Congestive Heart Failure Mother   . Cancer Sister     breast  . Cancer Sister     cervical  . Heart attack Brother     History  Drug Use No  ,  History  Alcohol Use  . 1.8 oz/week  . 1 Glasses of wine, 2 Shots of liquor per week  ,  History  Smoking Status  . Current Every Day Smoker  . Packs/day: 1.00  . Types: Cigarettes  Smokeless Tobacco  . Never Used  ,    Current Outpatient Prescriptions on File Prior to Visit  Medication Sig Dispense Refill  . ALPRAZolam (XANAX) 0.5 MG tablet Take 1 tablet (0.5 mg total) by mouth as needed for anxiety. 30 tablet 0  . aspirin 81 MG tablet Take 81 mg by mouth daily.    . Blood Glucose Monitoring Suppl (BLOOD GLUCOSE MONITOR SYSTEM) w/Device KIT Use as directed 1 each 0  . glucose blood (BAYER CONTOUR NEXT TEST) test strip Use to check blood sugar 2 times daily 100 each 12  . Insulin Glargine (TOUJEO SOLOSTAR) 300 UNIT/ML SOPN Inject 24 Units into the skin daily. Inject 24 units in the skin in the morning- add 4 units every 4 days until fasting blood sugar falls below 130 5 pen 3  . insulin lispro (HUMALOG KWIKPEN) 100 UNIT/ML KiwkPen Inject 8 to 10 units before main meal 4 pen 11  . Insulin Pen Needle 32G X 4 MM MISC 1 Syringe by Does not apply route daily. 30 each 3  . simvastatin (ZOCOR) 40 MG tablet Take 1 tablet by mouth daily.  0  . Vitamin D, Ergocalciferol, (DRISDOL) 50000 units CAPS capsule Take 1 capsule (50,000 Units total) by mouth every 7 (seven) days. 12 capsule 4   No current facility-administered medications on file prior to visit.     No  Known Allergies   Review of Systems  Cardiovascular: Negative for chest pain and palpitations.  Musculoskeletal: Positive for joint pain. Negative for back pain, myalgias and neck pain.       Bilateral hips, chronic pain  Neurological: Negative for dizziness, tingling and headaches.  Endo/Heme/Allergies: Negative for polydipsia.    Objective:    Blood pressure 93/63, pulse 98, height 5' 10"  (1.778 m), weight 203 lb 11.2 oz (92.4 kg).  Body mass index is 29.23 kg/m.  General: Well Developed, well nourished, and in no acute distress.  HEENT: Normocephalic, atraumatic, pupils equal round reactive to light, neck supple, No carotid bruits, no JVD Skin: Warm and dry, cap RF less 2 sec Cardiac: Regular rate and rhythm, S1, S2 WNL's, no murmurs rubs or gallops Respiratory: ECTA B/L, Not using accessory muscles, speaking in full sentences. NeuroM-Sk: Ambulates w/o assistance, moves ext * 4 w/o difficulty, sensation grossly intact.  Ext: NO edema b/l lower ext Psych: No HI/SI, judgement and insight good, Euthymic mood. Full Affect.

## 2016-04-30 NOTE — Assessment & Plan Note (Signed)
Counseling done- continue weight loss  All questions answered  Handouts given

## 2016-04-30 NOTE — Assessment & Plan Note (Signed)
Exercise- walk on his treadmill 5 minutes daily, slowly work his way up to 30 minutes daily.  Mobic RF- r/b meds d/c pt  - Patient declines referral for ultrasound guided hip joint injections at this time. Pain is not that bad.

## 2016-04-30 NOTE — Assessment & Plan Note (Signed)
We will obtain eye exam for DM  - meds and mgt per Endo/ Dr Lucianne MussKumar--> pt will get meds/ supplies from them

## 2016-04-30 NOTE — Assessment & Plan Note (Signed)
Cont supp- will check levels next OV in 63mo

## 2016-04-30 NOTE — Assessment & Plan Note (Signed)
Under good control. LFTs stable, continue meds.

## 2016-04-30 NOTE — Patient Instructions (Signed)
Osteoarthritis Osteoarthritis is a type of arthritis that affects tissue that covers the ends of bones in joints (cartilage). Cartilage acts as a cushion between the bones and helps them move smoothly. Osteoarthritis results when cartilage in the joints gets worn down. Osteoarthritis is sometimes called "wear and tear" arthritis. Osteoarthritis is the most common form of arthritis. It often occurs in older people. It is a condition that gets worse over time (a progressive condition). Joints that are most often affected by this condition are in:  Fingers.  Toes.  Hips.  Knees.  Spine, including neck and lower back. What are the causes? This condition is caused by age-related wearing down of cartilage that covers the ends of bones. What increases the risk? The following factors may make you more likely to develop this condition:  Older age.  Being overweight or obese.  Overuse of joints, such as in athletes.  Past injury of a joint.  Past surgery on a joint.  Family history of osteoarthritis. What are the signs or symptoms? The main symptoms of this condition are pain, swelling, and stiffness in the joint. The joint may lose its shape over time. Small pieces of bone or cartilage may break off and float inside of the joint, which may cause more pain and damage to the joint. Small deposits of bone (osteophytes) may grow on the edges of the joint. Other symptoms may include:  A grating or scraping feeling inside the joint when you move it.  Popping or creaking sounds when you move. Symptoms may affect one or more joints. Osteoarthritis in a major joint, such as your knee or hip, can make it painful to walk or exercise. If you have osteoarthritis in your hands, you might not be able to grip items, twist your hand, or control small movements of your hands and fingers (fine motor skills). How is this diagnosed? This condition may be diagnosed based on:  Your medical history.  A  physical exam.  Your symptoms.  X-rays of the affected joint(s).  Blood tests to rule out other types of arthritis. How is this treated? There is no cure for this condition, but treatment can help to control pain and improve joint function. Treatment plans may include:  A prescribed exercise program that allows for rest and joint relief. You may work with a physical therapist.  A weight control plan.  Pain relief techniques, such as:  Applying heat and cold to the joint.  Electric pulses delivered to nerve endings under the skin (transcutaneous electrical nerve stimulation, or TENS).  Massage.  Certain nutritional supplements.  NSAIDs or prescription medicines to help relieve pain.  Medicine to help relieve pain and inflammation (corticosteroids). This can be given by mouth (orally) or as an injection.  Assistive devices, such as a brace, wrap, splint, specialized glove, or cane.  Surgery, such as:  An osteotomy. This is done to reposition the bones and relieve pain or to remove loose pieces of bone and cartilage.  Joint replacement surgery. You may need this surgery if you have very bad (advanced) osteoarthritis. Follow these instructions at home: Activity  Rest your affected joints as directed by your health care provider.  Do not drive or use heavy machinery while taking prescription pain medicine.  Exercise as directed. Your health care provider or physical therapist may recommend specific types of exercise, such as:  Strengthening exercises. These are done to strengthen the muscles that support joints that are affected by arthritis. They can be performed  with weights or with exercise bands to add resistance.  Aerobic activities. These are exercises, such as brisk walking or water aerobics, that get your heart pumping.  Range-of-motion activities. These keep your joints easy to move.  Balance and agility exercises. Managing pain, stiffness, and swelling  If  directed, apply heat to the affected area as often as told by your health care provider. Use the heat source that your health care provider recommends, such as a moist heat pack or a heating pad.  If you have a removable assistive device, remove it as told by your health care provider.  Place a towel between your skin and the heat source. If your health care provider tells you to keep the assistive device on while you apply heat, place a towel between the assistive device and the heat source.  Leave the heat on for 20-30 minutes.  Remove the heat if your skin turns bright red. This is especially important if you are unable to feel pain, heat, or cold. You may have a greater risk of getting burned.  If directed, put ice on the affected joint:  If you have a removable assistive device, remove it as told by your health care provider.  Put ice in a plastic bag.  Place a towel between your skin and the bag. If your health care provider tells you to keep the assistive device on during icing, place a towel between the assistive device and the bag.  Leave the ice on for 20 minutes, 2-3 times a day. General instructions  Take over-the-counter and prescription medicines only as told by your health care provider.  Maintain a healthy weight. Follow instructions from your health care provider for weight control. These may include dietary restrictions.  Do not use any products that contain nicotine or tobacco, such as cigarettes and e-cigarettes. These can delay bone healing. If you need help quitting, ask your health care provider.  Use assistive devices as directed by your health care provider.  Keep all follow-up visits as told by your health care provider. This is important. Where to find more information:  [employer] and Musculoskeletal and Skin Diseases: www.niams.http://www.myers.net/nih.gov  [employer] Aging: https://walker.com/www.nia.nih.gov  American College of Rheumatology:  www.rheumatology.org Contact a health care provider if:  Your skin turns red.  You develop a rash.  You have pain that gets worse.  You have a fever along with joint or muscle aches. Get help right away if:  You lose a lot of weight.  You suddenly lose your appetite.  You have night sweats. Summary  Osteoarthritis is a type of arthritis that affects tissue covering the ends of bones in joints (cartilage).  This condition is caused by age-related wearing down of cartilage that covers the ends of bones.  The main symptom of this condition is pain, swelling, and stiffness in the joint.  There is no cure for this condition, but treatment can help to control pain and improve joint function. This information is not intended to replace advice given to you by your health care provider. Make sure you discuss any questions you have with your health care provider. Document Released: 05/04/2005 Document Revised: 01/06/2016 Document Reviewed: 01/06/2016 Elsevier Interactive Patient Education  2017 [employer]   Steps to Quit Smoking Smoking tobacco can be harmful to your health and can affect almost every organ in your body. Smoking puts you, and those around you, at risk for developing many serious chronic diseases. Quitting smoking is difficult,  but it is one of the best things that you can do for your health. It is never too late to quit. What are the benefits of quitting smoking? When you quit smoking, you lower your risk of developing serious diseases and conditions, such as:  Lung cancer or lung disease, such as COPD.  Heart disease.  Stroke.  Heart attack.  Infertility.  Osteoporosis and bone fractures. Additionally, symptoms such as coughing, wheezing, and shortness of breath may get better when you quit. You may also find that you get sick less often because your body is stronger at fighting off colds and infections. If you are pregnant, quitting smoking can help to  reduce your chances of having a baby of low birth weight. How do I get ready to quit? When you decide to quit smoking, create a plan to make sure that you are successful. Before you quit:  Pick a date to quit. Set a date within the next two weeks to give you time to prepare.  Write down the reasons why you are quitting. Keep this list in places where you will see it often, such as on your bathroom mirror or in your car or wallet.  Identify the people, places, things, and activities that make you want to smoke (triggers) and avoid them. Make sure to take these actions:  Throw away all cigarettes at home, at work, and in your car.  Throw away smoking accessories, such as Set designer.  Clean your car and make sure to empty the ashtray.  Clean your home, including curtains and carpets.  Tell your family, friends, and coworkers that you are quitting. Support from your loved ones can make quitting easier.  Talk with your health care provider about your options for quitting smoking.  Find out what treatment options are covered by your health insurance. What strategies can I use to quit smoking? Talk with your healthcare provider about different strategies to quit smoking. Some strategies include:  Quitting smoking altogether instead of gradually lessening how much you smoke over a period of time. Research shows that quitting "cold [place]" is more successful than gradually quitting.  Attending in-person counseling to help you build problem-solving skills. You are more likely to have success in quitting if you attend several counseling sessions. Even short sessions of 10 minutes can be effective.  Finding resources and support systems that can help you to quit smoking and remain smoke-free after you quit. These resources are most helpful when you use them often. They can include:  Online chats with a Veterinary surgeon.  Telephone quitlines.  Printed Materials engineer.  Support groups  or group counseling.  Text messaging programs.  Mobile phone applications.  Taking medicines to help you quit smoking. (If you are pregnant or breastfeeding, talk with your health care provider first.) Some medicines contain nicotine and some do not. Both types of medicines help with cravings, but the medicines that include nicotine help to relieve withdrawal symptoms. Your health care provider may recommend:  Nicotine patches, gum, or lozenges.  Nicotine inhalers or sprays.  Non-nicotine medicine that is taken by mouth. Talk with your health care provider about combining strategies, such as taking medicines while you are also receiving in-person counseling. Using these two strategies together makes you more likely to succeed in quitting than if you used either strategy on its own. If you are pregnant or breastfeeding, talk with your health care provider about finding counseling or other support strategies to quit smoking. Do not take  medicine to help you quit smoking unless told to do so by your health care provider. What things can I do to make it easier to quit? Quitting smoking might feel overwhelming at first, but there is a lot that you can do to make it easier. Take these important actions:  Reach out to your family and friends and ask that they support and encourage you during this time. Call telephone quitlines, reach out to support groups, or work with a counselor for support.  Ask people who smoke to avoid smoking around you.  Avoid places that trigger you to smoke, such as bars, parties, or smoke-break areas at work.  Spend time around people who do not smoke.  Lessen stress in your life, because stress can be a smoking trigger for some people. To lessen stress, try:  Exercising regularly.  Deep-breathing exercises.  Yoga.  Meditating.  Performing a body scan. This involves closing your eyes, scanning your body from head to toe, and noticing which parts of your body  are particularly tense. Purposefully relax the muscles in those areas.  Download or purchase mobile phone or tablet apps (applications) that can help you stick to your quit plan by providing reminders, tips, and encouragement. There are many free apps, such as QuitGuide from the [employer] developer(Centers for Disease Control and Prevention). You can find other support for quitting smoking (smoking cessation) through smokefree.gov and other websites. How will I feel when I quit smoking? Within the first 24 hours of quitting smoking, you may start to feel some withdrawal symptoms. These symptoms are usually most noticeable 2-3 days after quitting, but they usually do not last beyond 2-3 weeks. Changes or symptoms that you might experience include:  Mood swings.  Restlessness, anxiety, or irritation.  Difficulty concentrating.  Dizziness.  Strong cravings for sugary foods in addition to nicotine.  Mild weight gain.  Constipation.  Nausea.  Coughing or a sore throat.  Changes in how your medicines work in your body.  A depressed mood.  Difficulty sleeping (insomnia). After the first 2-3 weeks of quitting, you may start to notice more positive results, such as:  Improved sense of smell and taste.  Decreased coughing and sore throat.  Slower heart rate.  Lower blood pressure.  Clearer skin.  The ability to breathe more easily.  Fewer sick days. Quitting smoking is very challenging for most people. Do not get discouraged if you are not successful the first time. Some people need to make many attempts to quit before they achieve long-term success. Do your best to stick to your quit plan, and talk with your health care provider if you have any questions or concerns. This information is not intended to replace advice given to you by your health care provider. Make sure you discuss any questions you have with your health care provider. Document Released: 04/28/2001 Document Revised: 12/31/2015  Document Reviewed: 09/18/2014 Elsevier Interactive Patient Education  2017 Elsevier Inc.    What You Need to Know About Smokeless Tobacco Use Tobacco use is one of the leading causes of cancer and other chronic health problems. Smokeless tobacco is tobacco that is put directly into the mouth instead of being smoked. It may also be called chewing tobacco or snuff. Smokeless tobacco is made from the leaves of tobacco plants and it comes in several forms:  Loose, dry leaves, plugs, or twists.  Moist pouches.  Dissolving lozenges or strips. Chewing, sucking, or holding the tobacco in your mouth causes your mouth to  make more saliva. The saliva mixes with the tobacco to make "tobacco juice" that is swallowed or spit out. How can smokeless tobacco affect me? Using smokeless tobacco:  Increases your risk of developing cancer. Smokeless tobacco contains at least 28 different types of cancer-causing chemicals (carcinogens).  Increases your chances of developing other long-term health problems, including high blood pressure, heart disease, stroke, and dental problems.  Can make you become addicted. Nicotine is one of the chemicals in tobacco. When you chew tobacco, you absorb nicotine from the tobacco juice. This can make you feel more alert than usual.  Can cause problems with pregnancy. Pregnant women who use smokeless tobacco are more likely to miscarry or deliver a baby too early (premature delivery).  Can affect the appearance and health of your mouth. Using smokeless tobacco may cause bad breath, yellow-brown teeth, mouth sores, cracking and bleeding lips, gum recession, and lesions on the soft tissues of your mouth (leukoplakia). What are the benefits of not using smokeless tobacco? The benefits of not using smokeless tobacco include:  A healthy mind because:  You avoid addiction.  A healthy body because:  You avoid dental problems.  You promote healthy pregnancy.  You avoid  long-term health problems.  A healthy wallet because:  You avoid costs of buying tobacco.  You avoid health care costs in the future.  A healthy family because:  You avoid accidental poisoning of children in your household. What can happen if I continue to use smokeless tobacco? If you continue to use smokeless tobacco, you will increase your risk for developing certain cancers. These include:  Tongue.  Lips, mouth, and gums.  Throat (esophagus) and voice box (larynx).  Stomach.  Pancreas.  Bladder.  Colon. Long-term use of smokeless tobacco can also lead to:  High blood pressure, heart disease, and stroke.  Gum disease, gum recession, and bone loss around the teeth.  Tooth decay. How do I quit using smokeless tobacco? Quitting the use of smokeless tobacco can be hard, but it can be done. Follow these steps:  Pick a date to quit. Set a date within the next two weeks. This gives you time to prepare.  Write down the reasons why you are quitting. Keep this list in places where you will see it often, such as on your bathroom mirror or in your car or wallet.  Identify the people, places, things, and activities that make you want to use tobacco (triggers) and avoid them.  Get rid of any tobacco you have and remove any tobacco smells. To do this:  Throw away all containers of tobacco at home, at work, and in your car.  Throw away any other items that you use regularly when you chew tobacco.  Clean your car and make sure to remove all tobacco-related items.  Clean your home, including curtains and carpets.  Tell your family, friends, and coworkers that you are quitting. This can make quitting easier.  Ask your health care provider for help quitting smokeless tobacco. This may involve treatment. Find out what treatment options are covered by your health insurance.  Keep track of how many days have passed since you quit. Remembering how long and hard you have worked to  quit can help you avoid using tobacco again. Where can I get support? Ask your health care provider if there is a local support group for quitting smokeless tobacco. Where can I get more information? You can learn more about the risks of using smokeless tobacco and the  benefits of quitting from these sources:  [employer]: www.cancer.gov  American Cancer Society: www.cancer.org When should I seek medical care? Seek medical care if you have:  White or other discolored patches in your mouth.  Difficulty swallowing.  A change in your voice.  Unexplained weight loss.  Stomach pain, nausea, or vomiting. Summary  Smokeless tobacco contains at least 28 different chemicals that are known to cause cancer (carcinogen).  Nicotine is an addictive chemical in smokeless tobacco.  When you quit using smokeless tobacco, you lower your risk of developing cancer. This information is not intended to replace advice given to you by your health care provider. Make sure you discuss any questions you have with your health care provider. Document Released: 10/06/2010 Document Revised: 12/28/2015 Document Reviewed: 12/14/2014 Elsevier Interactive Patient Education  2017 Elsevier Inc.   Tobacco Use Disorder Tobacco use disorder (TUD) is a mental disorder. It is the long-term use of tobacco in spite of related health problems or difficulty with normal life activities. Tobacco is most commonly smoked as cigarettes and less commonly as cigars or pipes. Smokeless chewing tobacco and snuff are also popular. People with TUD get a feeling of extreme pleasure (euphoria) from using tobacco and have a desire to use it again and again. Repeated use of tobacco can cause problems. The addictive effects of tobacco are due mainly tothe ingredient nicotine. Nicotine also causes a rush of adrenaline (epinephrine) in the body. This leads to increased blood pressure, heart rate, and breathing rate. These  changes may cause problems for people with high blood pressure, weak hearts, or lung disease. High doses of nicotine in children and pets can lead to seizures and death. Tobacco contains a number of other unsafe chemicals. These chemicals are especially harmful when inhaled as smoke and can damage almost every organ in the body. Smokers live shorter lives than nonsmokers and are at risk of dying from a number of diseases and cancers. Tobacco smoke can also cause health problems for nonsmokers (due to inhaling secondhand smoke). Smoking is also a fire hazard. TUD usually starts in the late teenage years and is most common in young adults between the ages of 30 and 25 years. People who start smoking earlier in life are more likely to continue smoking as adults. TUD is somewhat more common in men than women. People with TUD are at higher risk for using alcohol and other drugs of abuse. What increases the risk? Risk factors for TUD include:  Having family members with the disorder.  Being around people who use tobacco.  Having an existing mental health issue such as schizophrenia, depression, bipolar disorder, ADHD, or posttraumatic stress disorder (PTSD). What are the signs or symptoms? People with tobacco use disorder have two or more of the following signs and symptoms within 12 months:  Use of more tobacco over a longer period than intended.  Not able to cut down or control tobacco use.  A lot of time spent obtaining or using tobacco.  Strong desire or urge to use tobacco (craving). Cravings may last for 6 months or longer after quitting.  Use of tobacco even when use leads to major problems at work, school, or home.  Use of tobacco even when use leads to relationship problems.  Giving up or cutting down on important life activities because of tobacco use.  Repeatedly using tobacco in situations where it puts you or others in physical danger, like smoking in bed.  Use of tobacco even  when  it is known that a physical or mental problem is likely related to tobacco use.  Physical problems are numerous and may include chronic bronchitis, emphysema, lung and other cancers, gum disease, high blood pressure, heart disease, and stroke.  Mental problems caused by tobacco may include difficulty sleeping and anxiety.  Need to use greater amounts of tobacco to get the same effect. This means you have developed a tolerance.  Withdrawal symptoms as a result of stopping or rapidly cutting back use. These symptoms may last a month or more after quitting and include the following:  Depressed, anxious, or irritable mood.  Difficulty concentrating.  Increased appetite.  Restlessness or trouble sleeping.  Use of tobacco to avoid withdrawal symptoms. How is this diagnosed? Tobacco use disorder is diagnosed by your health care provider. A diagnosis may be made by:  Your health care provider asking questions about your tobacco use and any problems it may be causing.  A physical exam.  Lab tests.  You may be referred to a mental health professional or addiction specialist. The severity of tobacco use disorder depends on the number of signs and symptoms you have:  Mild-Two or three symptoms.  Moderate-Four or five symptoms.  Severe-Six or more symptoms. How is this treated? Many people with tobacco use disorder are unable to quit on their own and need help. Treatment options include the following:  Nicotine replacement therapy (NRT). NRT provides nicotine without the other harmful chemicals in tobacco. NRT gradually lowers the dosage of nicotine in the body and reduces withdrawal symptoms. NRT is available in over-the-counter forms (gum, lozenges, and skin patches) as well as prescription forms (mouth inhaler and nasal spray).  Medicines.This may include:  Antidepressant medicine that may reduce nicotine cravings.  A medicine that acts on nicotine receptors in the brain to  reduce cravings and withdrawal symptoms. It may also block the effects of tobacco in people with TUD who relapse.  Counseling or talk therapy. A form of talk therapy called behavioral therapy is commonly used to treat people with TUD. Behavioral therapy looks at triggers for tobacco use, how to avoid them, and how to cope with cravings. It is most effective in person or by phone but is also available in self-help forms (books and Internet websites).  Support groups. These provide emotional support, advice, and guidance for quitting tobacco. The most effective treatment for TUD is usually a combination of medicine, talk therapy, and support groups. Follow these instructions at home:  Keep all follow-up visits as directed by your health care provider. This is important.  Take medicines only as directed by your health care provider.  Check with your health care provider before starting new prescription or over-the-counter medicines. Contact a health care provider if:  You are not able to take your medicines as prescribed.  Treatment is not helping your TUD and your symptoms get worse. Get help right away if:  You have serious thoughts about hurting yourself or others.  You have trouble breathing, chest pain, sudden weakness, or sudden numbness in part of your body. This information is not intended to replace advice given to you by your health care provider. Make sure you discuss any questions you have with your health care provider. Document Released: 01/08/2004 Document Revised: 01/05/2016 Document Reviewed: 06/30/2013 Elsevier Interactive Patient Education  2017 [employer].

## 2016-04-30 NOTE — Assessment & Plan Note (Signed)
Half dose of lisinopril from 10 mg daily down to 5 mg daily.  Lifestyle changes such as dash diet and engaging in a regular exercise program discussed with patient.  Educational handouts provided  Ambulatory BP monitoring encouraged. Keep log and bring in next OV  Contact us prior with any Q's/ concerns.

## 2016-04-30 NOTE — Assessment & Plan Note (Signed)
3-5 min counseling done

## 2016-04-30 NOTE — Assessment & Plan Note (Signed)
Handouts provided after counseling done.  Failed Chantix in the past due to causing depression  Declines Buproprion.  Information on smokeless tobacco\raping given to patient.  - Follow up if he needs more support with quitting sooner than planned when necessary

## 2016-06-01 ENCOUNTER — Ambulatory Visit: Payer: Medicare Other | Admitting: Endocrinology

## 2016-06-04 ENCOUNTER — Ambulatory Visit: Payer: Medicare Other | Admitting: Endocrinology

## 2016-06-04 ENCOUNTER — Encounter: Payer: Medicare Other | Admitting: Dietician

## 2016-06-10 DIAGNOSIS — H40053 Ocular hypertension, bilateral: Secondary | ICD-10-CM | POA: Diagnosis not present

## 2016-07-02 ENCOUNTER — Ambulatory Visit (INDEPENDENT_AMBULATORY_CARE_PROVIDER_SITE_OTHER): Payer: PPO | Admitting: Endocrinology

## 2016-07-02 ENCOUNTER — Encounter: Payer: Self-pay | Admitting: Endocrinology

## 2016-07-02 ENCOUNTER — Other Ambulatory Visit: Payer: Self-pay

## 2016-07-02 ENCOUNTER — Encounter: Payer: Self-pay | Admitting: Dietician

## 2016-07-02 ENCOUNTER — Encounter: Payer: PPO | Attending: Endocrinology | Admitting: Dietician

## 2016-07-02 VITALS — BP 114/76 | HR 83 | Ht 70.0 in | Wt 203.0 lb

## 2016-07-02 DIAGNOSIS — E1165 Type 2 diabetes mellitus with hyperglycemia: Secondary | ICD-10-CM | POA: Diagnosis not present

## 2016-07-02 DIAGNOSIS — Z794 Long term (current) use of insulin: Secondary | ICD-10-CM

## 2016-07-02 DIAGNOSIS — Z713 Dietary counseling and surveillance: Secondary | ICD-10-CM | POA: Diagnosis not present

## 2016-07-02 DIAGNOSIS — E119 Type 2 diabetes mellitus without complications: Secondary | ICD-10-CM

## 2016-07-02 MED ORDER — ONETOUCH ULTRASOFT LANCETS MISC: 12 refills | Status: DC

## 2016-07-02 MED ORDER — GLUCOSE BLOOD VI STRP: ORAL_STRIP | 5 refills | Status: DC

## 2016-07-02 MED ORDER — ONETOUCH VERIO W/DEVICE KIT
1.0000 | PACK | Freq: Once | 1 refills | Status: AC
Start: 2016-07-02 — End: 2016-07-02

## 2016-07-02 NOTE — Patient Instructions (Addendum)
Avoid skipping meals. Carbohydrate consistency with meals. Small portion of lean protein with meals and snacks.   Aim for 3 servings of carbohydrate (45 grams) with each meal and snack. Aim for 30 minutes of exercise most days.  Dr. Lucianne MussKumar stated to take 3-4 units of Humalog per starch.  Follow Dr. Remus BlakeKumar's other recommendations regarding insulin and medication adjustment. Vary the times that you take your blood sugar and bring records and meter to Dr. Lucianne MussKumar.  Consider writing brief notes beside high or low blood sugar readings in your log (i.e. Ate Chinese or McDonalds, sick etc.) If you have a low blood sugar (70 or less), follow the Rule of 15:  Take 15 grams of fast acting carbohydrate such as 1/2 cup juice or 3-4 glucose tabs.  Recheck your blood sugar and if it is still low then repeat.  If it is above 70 then eat a small meal or snack with protein.  Please call me if you have any questions.

## 2016-07-02 NOTE — Progress Notes (Signed)
Diabetes Self-Management Education  Visit Type: First/Initial  Appt. Start Time: 1415 Appt. End Time: 1630  07/02/2016  [name], identified by name and date of birth, is a 66 y.o. male with a diagnosis of Diabetes: Type 2 for the past 23 years.  Other hx includes hyperlipidemia, HTN, and vitamin D deficiency.    Medications include: Invokamet each dat at 11 am Toueo Solostar  28 units each night at 8 pm.  He states that he is to increase to 30 units and continue to increase 2 units until target fasting blood sugar of 130.   Humalog 10 units at 5 pm.  He has been taking this 1 hour prior to dinner.  (I discussed that he needs to take this 15 minutes before dinner or meal that has carbohydrates once daily.) Medications have been increased during his visit today with Dr. Lucianne Muss.  Patient lives alone.  He is a widower.  He does his own shopping and cooking and eats out occasionally.  He does not exercise routinely this time of year and complains of hip pain.  States that he has a treadmill at his home and there is a gym at the [employer] he goes to which is a mile from his home.  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  ASSESSMENT  Height 6' (1.829 m), weight 203 lb (92.1 kg). Body mass index is 27.53 kg/m.      Diabetes Self-Management Education - 07/02/16 1428      Visit Information   Visit Type First/Initial     Initial Visit   Diabetes Type Type 2   Are you currently following a meal plan? No   Are you taking your medications as prescribed? Yes   Date Diagnosed 1995     Health Coping   How would you rate your overall health? Excellent     Psychosocial Assessment   Patient Belief/Attitude about Diabetes Motivated to manage diabetes   Self-care barriers None   Self-management support Doctor's office   Other persons present Patient   Patient Concerns Nutrition/Meal planning;Glycemic Control   Special Needs None   Preferred Learning Style No preference  indicated   Learning Readiness Ready   How often do you need to have someone help you when you read instructions, pamphlets, or other written materials from your doctor or pharmacy? 1 - Never   What is the last grade level you completed in school? 12th grade     Pre-Education Assessment   Patient understands the diabetes disease and treatment process. Demonstrates understanding / competency   Patient understands incorporating nutritional management into lifestyle. Needs Review   Patient undertands incorporating physical activity into lifestyle. Needs Review   Patient understands using medications safely. Needs Review   Patient understands monitoring blood glucose, interpreting and using results Needs Review   Patient understands prevention, detection, and treatment of acute complications. Needs Review   Patient understands prevention, detection, and treatment of chronic complications. Demonstrates understanding / competency   Patient understands how to develop strategies to address psychosocial issues. Demonstrates understanding / competency   Patient understands how to develop strategies to promote health/change behavior. Needs Review     Complications   Last HgB A1C per patient/outside source 11 %  04/2016, 11.9% 02/11/16   How often do you check your blood sugar? 1-2 times/day   Fasting Blood glucose range (mg/dL) 045-409   Postprandial Blood glucose range (mg/dL) >811;914-782;956-213;08-657   Number of hypoglycemic episodes per month 0  Number of hyperglycemic episodes per week 1   Can you tell when your blood sugar is high? No   Have you had a dilated eye exam in the past 12 months? Yes   Have you had a dental exam in the past 12 months? Yes   Are you checking your feet? Yes   How many days per week are you checking your feet? 4     Dietary Intake   Breakfast 3 cups coffee with half and half   Snack (morning) rare bagel with butter   Lunch Ham sandwich on rye with chips OR soup  OR sandwich and soup OR McDonald's  12   Snack (afternoon) rare pretzles   [employer] Thursday night OR home:  homemade bean soup with ham sandwich OR breaded pork chops or chicken, mashed cauliflower, brussel sprouts  6   Snack (evening) sourdough hard pretzels   Beverage(s) coffee with half and half, diet peach tea, water     Exercise   Exercise Type Light (walking / raking leaves);ADL's  plays golf, walks dog   How many days per week to you exercise? 1   How many minutes per day do you exercise? 30   Total minutes per week of exercise 30     Patient Education   Previous Diabetes Education Yes (please comment)   Nutrition management  Role of diet in the treatment of diabetes and the relationship between the three main macronutrients and blood glucose level;Food label reading, portion sizes and measuring food.;Carbohydrate counting;Meal options for control of blood glucose level and chronic complications.;Information on hints to eating out and maintain blood glucose control.   Physical activity and exercise  Role of exercise on diabetes management, blood pressure control and cardiac health.;Helped patient identify appropriate exercises in relation to his/her diabetes, diabetes complications and other health issue.   Medications Reviewed patients medication for diabetes, action, purpose, timing of dose and side effects.   Monitoring Identified appropriate SMBG and/or A1C goals.;Taught/discussed recording of test results and interpretation of SMBG.;Yearly dilated eye exam;Daily foot exams;Purpose and frequency of SMBG.   Acute complications Taught treatment of hypoglycemia - the 15 rule.;Discussed and identified patients' treatment of hyperglycemia.   Chronic complications Dental care;Retinopathy and reason for yearly dilated eye exams   Psychosocial adjustment Role of stress on diabetes     Individualized Goals (developed by patient)   Physical Activity Exercise 5-7 days per  week;30 minutes per day   Medications take my medication as prescribed   Monitoring  test my blood glucose as discussed   Reducing Risk examine blood glucose patterns;do foot checks daily;treat hypoglycemia with 15 grams of carbs if blood glucose less than 70mg /dL;Other (comment)  choosing healthy fats   Health Coping discuss diabetes with (comment)  MD/RD     Post-Education Assessment   Patient understands the diabetes disease and treatment process. Demonstrates understanding / competency   Patient understands incorporating nutritional management into lifestyle. Needs Review   Patient undertands incorporating physical activity into lifestyle. Needs Review   Patient understands using medications safely. Needs Review   Patient understands monitoring blood glucose, interpreting and using results Demonstrates understanding / competency   Patient understands prevention, detection, and treatment of acute complications. Demonstrates understanding / competency   Patient understands prevention, detection, and treatment of chronic complications. Demonstrates understanding / competency   Patient understands how to develop strategies to address psychosocial issues. Demonstrates understanding / competency   Patient understands how to develop strategies to promote health/change  behavior. Demonstrates understanding / competency     Outcomes   Expected Outcomes Demonstrated interest in learning. Expect positive outcomes   Future DMSE PRN   Program Status Completed      Individualized Plan for Diabetes Self-Management Training:   Learning Objective:  Patient will have a greater understanding of diabetes self-management. Patient education plan is to attend individual and/or group sessions per assessed needs and concerns.   Plan:   Patient Instructions  Avoid skipping meals. Carbohydrate consistency with meals. Small portion of lean protein with meals and snacks.   Aim for 3 servings of  carbohydrate (45 grams) with each meal and snack. Aim for 30 minutes of exercise most days.  Dr. Lucianne MussKumar stated to take 3-4 units of Humalog per starch.  Follow Dr. Remus BlakeKumar's other recommendations regarding insulin and medication adjustment. Vary the times that you take your blood sugar and bring records and meter to Dr. Lucianne MussKumar.  Consider writing brief notes beside high or low blood sugar readings in your log (i.e. Ate Chinese or McDonalds, sick etc.) If you have a low blood sugar (70 or less), follow the Rule of 15:  Take 15 grams of fast acting carbohydrate such as 1/2 cup juice or 3-4 glucose tabs.  Recheck your blood sugar and if it is still low then repeat.  If it is above 70 then eat a small meal or snack with protein.  Please call me if you have any questions.   Expected Outcomes:  Demonstrated interest in learning. Expect positive outcomes  Education material provided: Living Well with Diabetes, Food label handouts, A1C conversion sheet, Meal plan card, My Plate and Snack sheet and mailed Dining Out with Diabetes and Making healthy fast food choices.  If problems or questions, patient to contact team via:  Phone and Email  Future DSME appointment: PRN

## 2016-07-02 NOTE — Patient Instructions (Signed)
Check blood sugars on waking up  3-4x per week  Also check blood sugars about 2 hours after a meal and do this after different meals by rotation  Recommended blood sugar levels on waking up is 90-130 and about 2 hours after meal is 130-160  Please bring your blood sugar monitor to each visit, thank you  Take 3-4 units Humalog per starch  30 Toujeo and may need to go up 2 more, keep am sugar as above

## 2016-07-02 NOTE — Progress Notes (Signed)
Patient ID: Jose Munoz, male   DOB: [birth year], 66 y.o.   MRN: [id number]           Reason for Appointment:  for Type 2 Diabetes  Referring physician: Opalski   History of Present Illness:          Date of diagnosis of type 2 diabetes mellitus:        Background history:   He thinks his diabetes was relatively easier to control and the onset even though he had initial symptoms of thirst and weight loss He had done very well with his diet and lost a significant amount of weight and was approved to control his diabetes without medications for a few years Subsequently he started with oral medications including metformin, Actos and Amaryl He thinks his blood sugars were well controlled for a few years initially but started getting higher progressively since 2016.  Recent history:   He had an A1c of 10% in 10/2015 Because of poor control he was started on basal bolus insulin regimen  INSULIN regimen: TOUJEO 28 units daily, Humalog 10 units at supper  Non-insulin hypoglycemic drugs the patient is taking are:Invokamet 150/1000 twice a day  Current management, blood sugar patterns and problems identified:  He was advised to increase his Toujeo insulin progressively to get fasting blood sugars under control but he did not understand and his morning readings are still averaging about 160-170  He is also not taking HUMALOG insulin at suppertime with overall improvement and postprandial readings at night  He does not eat much carbohydrate at lunch and is not taking any insulin for this meal  He is monitoring blood sugars mostly fasting and bedtime and none after breakfast and lunch.  Invokamet has been continued unchanged.  He is not adjusting his HUMALOG based on his meal intake and is showing fluctuation in readings after supper  However overall blood sugars are much better with previous readings are running between 215 and 330.  His weight has stayed the same        Side  effects from medications have been: None  Compliance with the medical regimen: Fair  Glucose monitoring:  done less than 1 times a day         Glucometer: ?  Contour  Blood sugars by download:  Mean values apply above for all meters except median for One Touch  PRE-MEAL Fasting Lunch Dinner Bedtime Overall  Glucose range: 132-188   187  84-234    Mean/median: 168    159      Self-care: The diet that the patient has been following is: tries to limit sweets and drinks with sugar .      Typical meal intake: Breakfast is now only recently being skipped Lunch is a sandwich, has various snacks              Dietician visit, most recent:20 Years ago CDE visit: 11/17               Exercise:  may walk on treadmill, averaging 2/7 days a week  Weight history:  Wt Readings from Last 3 Encounters:  07/02/16 203 lb (92.1 kg)  07/02/16 203 lb (92.1 kg)  04/30/16 203 lb 11.2 oz (92.4 kg)    Glycemic control:   Lab Results  Component Value Date   HGBA1C 11.9 02/11/2016   HGBA1C 10.0 (H) 10/18/2015   Lab Results  Component Value Date   MICROALBUR 104.0 05/04/2014   LDLCALC 60 10/18/2015  CREATININE 0.76 04/24/2016   No results found for: MICRALBCREAT     Allergies as of 07/02/2016   No Known Allergies     Medication List       Accurate as of 07/02/16  9:07 PM. Always use your most recent med list.          ALPRAZolam 0.5 MG tablet Commonly known as:  XANAX Take 1 tablet (0.5 mg total) by mouth as needed for anxiety.   aspirin 81 MG tablet Take 81 mg by mouth daily.   glucose blood test strip Commonly known as:  ONETOUCH VERIO Use to test blood sugar 2 times daily- Dx code E11.9   Insulin Glargine 300 UNIT/ML Sopn Commonly known as:  TOUJEO SOLOSTAR Inject 24 Units into the skin daily. Inject 24 units in the skin in the morning- add 4 units every 4 days until fasting blood sugar falls below 130   insulin lispro 100 UNIT/ML KiwkPen Commonly known as:  HUMALOG  KWIKPEN Inject 8 to 10 units before main meal   Insulin Pen Needle 32G X 4 MM Misc 1 Syringe by Does not apply route daily.   INVOKAMET [phone] MG Tabs Generic drug:  Canagliflozin-Metformin HCl Take 2 tablets by mouth daily.   lisinopril 10 MG tablet Commonly known as:  PRINIVIL,ZESTRIL Take 0.5 tablets (5 mg total) by mouth daily.   meloxicam 15 MG tablet Commonly known as:  MOBIC 1/2- 1 tab po daily for pain   onetouch ultrasoft lancets Use to test blood sugar 2 times daily- Dx code E11.9   ONETOUCH VERIO w/Device Kit 1 Device by Does not apply route once. Dx code E11.9   simvastatin 40 MG tablet Commonly known as:  ZOCOR Take 1 tablet by mouth daily.   Vitamin D (Ergocalciferol) 50000 units Caps capsule Commonly known as:  DRISDOL Take 1 capsule (50,000 Units total) by mouth every 7 (seven) days.       Allergies: No Known Allergies  Past Medical History:  Diagnosis Date  . Diabetes mellitus without complication ([place])   . Essential hypertension 10/16/2015  . Hyperlipidemia   . OA (osteoarthritis) of hip 11/05/2015  . Obese 10/16/2015  . Smoking   . Vitamin D deficiency 11/05/2015    No past surgical history on file.  Family History  Problem Relation Age of Onset  . Diabetes Mother   . Congestive Heart Failure Mother   . Cancer Sister     breast  . Cancer Sister     cervical  . Heart attack Brother     Social History:  reports that he has been smoking Cigarettes.  He has been smoking about 1.00 pack per day. He has never used smokeless tobacco. He reports that he drinks about 1.8 oz of alcohol per week . He reports that he does not use drugs.   Review of Systems   Lipid history: Has good lipid levels without statin drugs    Lab Results  Component Value Date   CHOL 131 10/18/2015   HDL 43 10/18/2015   LDLCALC 60 10/18/2015   TRIG 142 10/18/2015   CHOLHDL 3.0 10/18/2015           Hypertension:Minimal, is being Treated with lisinopril 5 mg by  PCP  Most recent eye exam was 8/17, reportedly not having retinopathy but is being watched for glaucoma  Most recent foot exam: 11/17   LABS:  No visits with results within 1 Week(s) from this visit.  Latest known visit with results  is:  Abstract on 05/01/2016  Component Date Value Ref Range Status  . HM Diabetic Eye Exam 10/09/2015 No Retinopathy  No Retinopathy Final    Physical Examination:  BP 114/76   Pulse 83   Ht 5' 10"  (1.778 m)   Wt 203 lb (92.1 kg)   SpO2 96%   BMI 29.13 kg/m    ASSESSMENT:  Diabetes type 2, uncontrolled  See history of present illness for detailed discussion of current diabetes management, blood sugar patterns and problems identified  He is doing very well now with using basal bolus insulin regimen With adding mealtime insulin at suppertime and slight increase in his basal insulin and his glucose readings are now overall averaging 164 at home, previously well over 200 However he still tends to have relatively high fasting readings Postprandial readings are fluctuating based on his intake   PLAN:     Discussed needing to adjust the suppertime insulin based on his carbohydrate intake, most likely will need to learn how to count carbohydrates  He will see the dietitian today and try to get more information on this  To start with he can try taking 3-4 units of Humalog per starch serving an extra 3 units for anything high fat  Also if he is eating a larger meal at breakfast or lunch she will need to add insulin similarly  Increase TOUJEO to 30 units for now and may need to further go up and steps of 3 units until morning readings are consistently under 130  Encouraged him to exercise more regularly  Needs follow-up A1c  May also consider using freestyle Libre sensor if  covered by insurance  Patient Instructions  Check blood sugars on waking up  3-4x per week  Also check blood sugars about 2 hours after a meal and do this after  different meals by rotation  Recommended blood sugar levels on waking up is 90-130 and about 2 hours after meal is 130-160  Please bring your blood sugar monitor to each visit, thank you  Take 3-4 units Humalog per starch  30 Toujeo and may need to go up 2 more, keep am sugar as above      Counseling time on subjects discussed above is over 50% of today's 25 minute visit  Aliviana Burdell 07/02/2016, 9:07 PM   Note: This office note was prepared with Dragon voice recognition system technology. Any transcriptional errors that result from this process are unintentional.

## 2016-07-03 ENCOUNTER — Telehealth: Payer: Self-pay | Admitting: Dietician

## 2016-07-03 NOTE — Telephone Encounter (Signed)
Brief Nutrition Note Called patient to review his carbohydrate and Insulin instructions per Dr. Remus BlakeKumar's note. Patient with no questions and to call as needed. Oran ReinLaura Curley Fayette, RD, LDN

## 2016-07-03 NOTE — Telephone Encounter (Signed)
complete

## 2016-07-28 ENCOUNTER — Telehealth: Payer: Self-pay | Admitting: Endocrinology

## 2016-07-28 ENCOUNTER — Ambulatory Visit: Payer: Medicare Other | Admitting: Family Medicine

## 2016-07-28 MED ORDER — INVOKAMET 150-1000 MG PO TABS: 2.0000 | ORAL_TABLET | Freq: Every day | ORAL | 3 refills | Status: DC

## 2016-07-28 NOTE — Telephone Encounter (Signed)
Refills submitted.  

## 2016-07-28 NOTE — Telephone Encounter (Signed)
Refill   INVOKAMET [phone] MG TABS  [facility] [address] - [place] - [address] [phone] (Phone) [phone] (Fax)

## 2016-08-10 ENCOUNTER — Other Ambulatory Visit: Payer: PPO

## 2016-08-11 ENCOUNTER — Other Ambulatory Visit (INDEPENDENT_AMBULATORY_CARE_PROVIDER_SITE_OTHER): Payer: PPO

## 2016-08-11 DIAGNOSIS — E1165 Type 2 diabetes mellitus with hyperglycemia: Secondary | ICD-10-CM | POA: Diagnosis not present

## 2016-08-11 DIAGNOSIS — Z794 Long term (current) use of insulin: Secondary | ICD-10-CM | POA: Diagnosis not present

## 2016-08-11 LAB — LIPID PANEL
CHOL/HDL RATIO: 3
CHOLESTEROL: 126 mg/dL (ref 0–200)
HDL: 39.4 mg/dL (ref >39.00)
LDL Cholesterol: 51 mg/dL (ref 0–99)
NonHDL: 86.67
Triglycerides: 180 mg/dL — ABNORMAL HIGH (ref 0.0–149.0)
VLDL: 36 mg/dL (ref 0.0–40.0)

## 2016-08-11 LAB — MICROALBUMIN / CREATININE URINE RATIO
CREATININE, U: 83.3 mg/dL
MICROALB UR: 4.3 mg/dL — AB (ref 0.0–1.9)
Microalb Creat Ratio: 5.2 mg/g (ref 0.0–30.0)

## 2016-08-11 LAB — COMPREHENSIVE METABOLIC PANEL
ALT: 14 U/L (ref 0–53)
AST: 14 U/L (ref 0–37)
Albumin: 4.6 g/dL (ref 3.5–5.2)
Alkaline Phosphatase: 57 U/L (ref 39–117)
BUN: 21 mg/dL (ref 6–23)
CHLORIDE: 101 meq/L (ref 96–112)
CO2: 28 meq/L (ref 19–32)
Calcium: 10 mg/dL (ref 8.4–10.5)
Creatinine, Ser: 0.72 mg/dL (ref 0.40–1.50)
GFR: 115.35 mL/min (ref >60.00)
GLUCOSE: 126 mg/dL — AB (ref 70–99)
Potassium: 3.9 mEq/L (ref 3.5–5.1)
Sodium: 136 mEq/L (ref 135–145)
Total Bilirubin: 0.3 mg/dL (ref 0.2–1.2)
Total Protein: 7.8 g/dL (ref 6.0–8.3)

## 2016-08-11 LAB — HEMOGLOBIN A1C: HEMOGLOBIN A1C: 7.3 % — AB (ref 4.6–6.5)

## 2016-08-13 ENCOUNTER — Encounter: Payer: Self-pay | Admitting: Endocrinology

## 2016-08-13 ENCOUNTER — Ambulatory Visit (INDEPENDENT_AMBULATORY_CARE_PROVIDER_SITE_OTHER): Payer: PPO | Admitting: Endocrinology

## 2016-08-13 VITALS — BP 102/68 | HR 80 | Ht 70.0 in | Wt 202.0 lb

## 2016-08-13 DIAGNOSIS — E781 Pure hyperglyceridemia: Secondary | ICD-10-CM | POA: Diagnosis not present

## 2016-08-13 DIAGNOSIS — Z794 Long term (current) use of insulin: Secondary | ICD-10-CM | POA: Diagnosis not present

## 2016-08-13 DIAGNOSIS — I10 Essential (primary) hypertension: Secondary | ICD-10-CM | POA: Diagnosis not present

## 2016-08-13 DIAGNOSIS — E1165 Type 2 diabetes mellitus with hyperglycemia: Secondary | ICD-10-CM

## 2016-08-13 MED ORDER — LISINOPRIL 5 MG PO TABS: 5.0000 mg | ORAL_TABLET | Freq: Every day | ORAL | 2 refills | Status: DC

## 2016-08-13 NOTE — Patient Instructions (Addendum)
Check blood sugars on waking up  4x per week  Also check blood sugars about 2 hours after a meal and do this after different meals by rotation  Recommended blood sugar levels on waking up is 90-130 and about 2 hours after meal is 130-160  Please bring your blood sugar monitor to each visit, thank you

## 2016-08-13 NOTE — Progress Notes (Signed)
Patient ID: Jose Munoz, male   DOB: 12-Feb-1951, 66 y.o.   MRN: [id number]           Reason for Appointment:  for Type 2 Diabetes  Referring physician: Opalski   History of Present Illness:          Date of diagnosis of type 2 diabetes mellitus: 1998       Background history:   He thinks his diabetes was relatively easier to control and the onset even though he had initial symptoms of thirst and weight loss He had done very well with his diet and lost a significant amount of weight and was approved to control his diabetes without medications for a few years Subsequently he started with oral medications including metformin, Actos and Amaryl He thinks his blood sugars were well controlled for a few years initially but started getting higher progressively since 2016.  Recent history:   Because of poor control he was started on basal bolus insulin regimen, highest A1c was 11.9 in 01/2016 A1c is now down to 7.3  INSULIN regimen: TOUJEO 30 units daily, Humalog 10-12 units at supper  Non-insulin hypoglycemic drugs the patient is taking are:Invokamet 150/1000 twice a day  Current management, blood sugar patterns and problems identified:  Fasting blood sugars are now better under control with using 30 units total Toujeo  He is also consistently taking HUMALOG insulin at suppertime with overall improvement and postprandial readings at night, mostly taking 10-12 units  Although he was told to count carbohydrates and use the grams of carbohydrates as a basis for insulin regimen he is not able to do this on his own and only adjusting the dose up to 12 units when eating more carbohydrate  He does not eat much carbohydrate at lunch usually and is not taking any insulin for this meal  He is monitoring blood sugars mostly fasting and bedtime and none after breakfast and lunch.  Invokamet has been continued unchanged.Marland Kitchen  His weight has stayed the same  Hypoglycemia: He had one episode  where he felt shaky and had distortion of his vision, relieved by orange juice even though he says his blood sugar was 147       Side effects from medications have been: None  Compliance with the medical regimen: Fair  Glucose monitoring:  done less than 1 times a day         Glucometer: ?  Contour  Blood sugars by download:  Mean values apply above for all meters except median for One Touch  PRE-MEAL Fasting Lunch Dinner Bedtime Overall  Glucose range: 111-156    96-1 64    Mean/median: 130   128 129     Self-care: The diet that the patient has been following is: tries to limit sweets and drinks with sugar .      Typical meal intake: Breakfast is now only recently being skipped Lunch is a sandwich, has various snacks              Dietician visit, most recent:20 Years ago CDE visit: 11/17               Exercise:  may walk on treadmill, averaging 3/7 days a week  Weight history:  Wt Readings from Last 3 Encounters:  08/13/16 202 lb (91.6 kg)  07/02/16 203 lb (92.1 kg)  07/02/16 203 lb (92.1 kg)    Glycemic control:   Lab Results  Component Value Date   HGBA1C 7.3 (H) 08/11/2016  HGBA1C 11.9 02/11/2016   HGBA1C 10.0 (H) 10/18/2015   Lab Results  Component Value Date   MICROALBUR 4.3 (H) 08/11/2016   LDLCALC 51 08/11/2016   CREATININE 0.72 08/11/2016   Lab Results  Component Value Date   MICRALBCREAT 5.2 08/11/2016       Allergies as of 08/13/2016   No Known Allergies     Medication List       Accurate as of 08/13/16 11:59 PM. Always use your most recent med list.          ALPRAZolam 0.5 MG tablet Commonly known as:  XANAX Take 1 tablet (0.5 mg total) by mouth as needed for anxiety.   aspirin 81 MG tablet Take 81 mg by mouth daily.   glucose blood test strip Commonly known as:  ONETOUCH VERIO Use to test blood sugar 2 times daily- Dx code E11.9   Insulin Glargine 300 UNIT/ML Sopn Commonly known as:  TOUJEO SOLOSTAR Inject 24 Units into the  skin daily. Inject 24 units in the skin in the morning- add 4 units every 4 days until fasting blood sugar falls below 130   insulin lispro 100 UNIT/ML KiwkPen Commonly known as:  HUMALOG KWIKPEN Inject 8 to 10 units before main meal   Insulin Pen Needle 32G X 4 MM Misc 1 Syringe by Does not apply route daily.   INVOKAMET [phone] MG Tabs Generic drug:  Canagliflozin-Metformin HCl Take 2 tablets by mouth daily.   lisinopril 5 MG tablet Commonly known as:  PRINIVIL,ZESTRIL Take 1 tablet (5 mg total) by mouth daily.   meloxicam 15 MG tablet Commonly known as:  MOBIC 1/2- 1 tab po daily for pain   onetouch ultrasoft lancets Use to test blood sugar 2 times daily- Dx code E11.9   simvastatin 40 MG tablet Commonly known as:  ZOCOR Take 1 tablet by mouth daily.   Vitamin D (Ergocalciferol) 50000 units Caps capsule Commonly known as:  DRISDOL Take 1 capsule (50,000 Units total) by mouth every 7 (seven) days.       Allergies: No Known Allergies  Past Medical History:  Diagnosis Date  . Diabetes mellitus without complication (HCC)   . Essential hypertension 10/16/2015  . Hyperlipidemia   . OA (osteoarthritis) of hip 11/05/2015  . Obese 10/16/2015  . Smoking   . Vitamin D deficiency 11/05/2015    No past surgical history on file.  Family History  Problem Relation Age of Onset  . Diabetes Mother   . Congestive Heart Failure Mother   . Cancer Sister     breast  . Cancer Sister     cervical  . Heart attack Brother     Social History:  reports that he has been smoking Cigarettes.  He has been smoking about 1.00 pack per day. He has never used smokeless tobacco. He reports that he drinks about 1.8 oz of alcohol per week . He reports that he does not use drugs.   Review of Systems   Lipid history: Has good LDL levels without statin drugs However triglycerides and HDL are borderline   Lab Results  Component Value Date   CHOL 126 08/11/2016   HDL 39.40 08/11/2016    LDLCALC 51 08/11/2016   TRIG 180.0 (H) 08/11/2016   CHOLHDL 3 08/11/2016           Hypertension: Minimal, is being Treated with lisinopril 10 mg by PCP, blood pressure appears lower now  BP Readings from Last 3 Encounters:  08/13/16 102/68  07/02/16 114/76  04/30/16 93/63     Most recent eye exam was 8/17, reportedly not having retinopathy but is being watched for glaucoma  Most recent foot exam: 11/17   LABS:  Lab on 08/11/2016  Component Date Value Ref Range Status  . Hgb A1c MFr Bld 08/11/2016 7.3* 4.6 - 6.5 % Final  . Sodium 08/11/2016 136  135 - 145 mEq/L Final  . Potassium 08/11/2016 3.9  3.5 - 5.1 mEq/L Final  . Chloride 08/11/2016 101  96 - 112 mEq/L Final  . CO2 08/11/2016 28  19 - 32 mEq/L Final  . Glucose, Bld 08/11/2016 126* 70 - 99 mg/dL Final  . BUN 96/08/5407 21  6 - 23 mg/dL Final  . Creatinine, Ser 08/11/2016 0.72  0.40 - 1.50 mg/dL Final  . Total Bilirubin 08/11/2016 0.3  0.2 - 1.2 mg/dL Final  . Alkaline Phosphatase 08/11/2016 57  39 - 117 U/L Final  . AST 08/11/2016 14  0 - 37 U/L Final  . ALT 08/11/2016 14  0 - 53 U/L Final  . Total Protein 08/11/2016 7.8  6.0 - 8.3 g/dL Final  . Albumin 81/19/1478 4.6  3.5 - 5.2 g/dL Final  . Calcium 29/56/2130 10.0  8.4 - 10.5 mg/dL Final  . GFR 86/57/8469 115.35  >60.00 mL/min Final  . Cholesterol 08/11/2016 126  0 - 200 mg/dL Final  . Triglycerides 08/11/2016 180.0* 0.0 - 149.0 mg/dL Final  . HDL 62/95/2841 39.40  >39.00 mg/dL Final  . VLDL 32/44/0102 36.0  0.0 - 40.0 mg/dL Final  . LDL Cholesterol 08/11/2016 51  0 - 99 mg/dL Final  . Total CHOL/HDL Ratio 08/11/2016 3   Final  . NonHDL 08/11/2016 86.67   Final  . Microalb, Ur 08/11/2016 4.3* 0.0 - 1.9 mg/dL Final  . Creatinine,U 72/53/6644 83.3  mg/dL Final  . Microalb Creat Ratio 08/11/2016 5.2  0.0 - 30.0 mg/g Final    Physical Examination:  BP 102/68   Pulse 80   Ht 5\' 10"  (1.778 m)   Wt 202 lb (91.6 kg)   SpO2 97%   BMI 28.98 kg/m     ASSESSMENT:  Diabetes type 2, uncontrolled  See history of present illness for detailed discussion of current diabetes management, blood sugar patterns and problems identified  He is doing very well now with using basal bolus insulin regimen Recent A1c was 7.3, previously much higher His fasting readings are fairly close to target usually although mildly increased overall current dose of Toujeo Continuing to use Humalog at suppertime his readings after meals at night are excellent, only occasionally low normal when he is getting less carbohydrate No recent hypoglycemia with this However not clear if he is getting higher reading post prandial after breakfast or lunch which is not being monitored.  HYPERTENSION: His blood pressure is low normal and he is not able to cut his 10 mg lisinopril in half, currently still taking 10 mg, blood pressure is relatively lower with using Invokana  Dyslipidemia: He has only mild increase in his and triglycerides, no treatment needed especially with LDL excellent  PLAN:     Discussed needing to check blood sugars after breakfast and lunch also instead of just after supper  He probably will need some Humalog, probably 8-10 units for lunch if eating significant amount of carbohydrate  Need to continue adjust the suppertime insulin based on his carbohydrate intake, he can probably get down to 8-9 units for lower carbohydrate meals and continue average of 10  units for now  Discussed that if he is able to count carbohydrates accurately even likely need to divide carbohydrate grams by 5  Continue to be increasing exercise level and activity and walking  May check blood sugars every other day in the mornings daily, if blood sugars are consistently can continue 30 units Toujeo but may need to adjust it further variability in morning readings, discussed fasting target  Continue Invokamet  Reduce lisinopril to 5 mg and he can discuss potentially stopping  this with recently since he does not have microalbuminuria, new prescription sent   Patient Instructions  Check blood sugars on waking up  4x per week  Also check blood sugars about 2 hours after a meal and do this after different meals by rotation  Recommended blood sugar levels on waking up is 90-130 and about 2 hours after meal is 130-160  Please bring your blood sugar monitor to each visit, thank you       Counseling time on subjects discussed above is over 50% of today's 25 minute visit  Hannahgrace Lalli 08/16/2016, 8:49 PM   Note: This office note was prepared with Dragon voice recognition system technology. Any transcriptional errors that result from this process are unintentional.

## 2016-09-02 ENCOUNTER — Other Ambulatory Visit: Payer: Self-pay

## 2016-09-02 MED ORDER — INSULIN ASPART 100 UNIT/ML FLEXPEN: PEN_INJECTOR | SUBCUTANEOUS | 3 refills | Status: DC

## 2016-09-09 ENCOUNTER — Encounter: Payer: Self-pay | Admitting: Family Medicine

## 2016-09-09 ENCOUNTER — Ambulatory Visit (INDEPENDENT_AMBULATORY_CARE_PROVIDER_SITE_OTHER): Payer: PPO | Admitting: Family Medicine

## 2016-09-09 VITALS — BP 103/68 | HR 86 | Ht 70.0 in | Wt 205.8 lb

## 2016-09-09 DIAGNOSIS — E663 Overweight: Secondary | ICD-10-CM

## 2016-09-09 DIAGNOSIS — I10 Essential (primary) hypertension: Secondary | ICD-10-CM

## 2016-09-09 DIAGNOSIS — M160 Bilateral primary osteoarthritis of hip: Secondary | ICD-10-CM | POA: Diagnosis not present

## 2016-09-09 DIAGNOSIS — Z23 Encounter for immunization: Secondary | ICD-10-CM

## 2016-09-09 DIAGNOSIS — Z720 Tobacco use: Secondary | ICD-10-CM | POA: Diagnosis not present

## 2016-09-09 DIAGNOSIS — E781 Pure hyperglyceridemia: Secondary | ICD-10-CM | POA: Diagnosis not present

## 2016-09-09 DIAGNOSIS — E559 Vitamin D deficiency, unspecified: Secondary | ICD-10-CM

## 2016-09-09 DIAGNOSIS — E119 Type 2 diabetes mellitus without complications: Secondary | ICD-10-CM | POA: Diagnosis not present

## 2016-09-09 DIAGNOSIS — E782 Mixed hyperlipidemia: Secondary | ICD-10-CM | POA: Diagnosis not present

## 2016-09-09 DIAGNOSIS — F17200 Nicotine dependence, unspecified, uncomplicated: Secondary | ICD-10-CM

## 2016-09-09 DIAGNOSIS — Z716 Tobacco abuse counseling: Secondary | ICD-10-CM | POA: Diagnosis not present

## 2016-09-09 MED ORDER — LISINOPRIL 5 MG PO TABS: 5.0000 mg | ORAL_TABLET | Freq: Every day | ORAL | 1 refills | Status: DC

## 2016-09-09 MED ORDER — VITAMIN D (ERGOCALCIFEROL) 1.25 MG (50000 UNIT) PO CAPS: 50000.0000 [IU] | ORAL_CAPSULE | ORAL | 4 refills | Status: DC

## 2016-09-09 MED ORDER — MELOXICAM 15 MG PO TABS: ORAL_TABLET | ORAL | 1 refills | Status: DC

## 2016-09-09 NOTE — Assessment & Plan Note (Signed)
Counseling done regarding disease process and various treatment options.  Declines meds,  Will consider vaping more often  All questions answered  Handouts given if patient desired them

## 2016-09-09 NOTE — Assessment & Plan Note (Signed)
Obtain level and refill meds given to pt - feeling more energetic/ better since starting it

## 2016-09-09 NOTE — Assessment & Plan Note (Addendum)
rf mobic given to pt- working well and tol well  - exercise 5d/wk

## 2016-09-09 NOTE — Assessment & Plan Note (Addendum)
Can't tolerate fish oil tabs and doesn't want new med  - handouts on diet and lifestyle mod d/c pt  - reck 81mo and will add fibrates if needed

## 2016-09-09 NOTE — Assessment & Plan Note (Signed)
Cont meds - reviewed FLP from March w pt today

## 2016-09-09 NOTE — Assessment & Plan Note (Signed)
5 min done  Has vapes at home and we discussed using them

## 2016-09-09 NOTE — Progress Notes (Signed)
Impression and Recommendations:    1. Essential hypertension   2. Hypertriglyceridemia   3. Vitamin D deficiency   4. Diabetes mellitus without complication (HCC)   5. Mixed hyperlipidemia   6. Overweight (BMI 25.0-29.9)   7. Smoking   8. smoking cessation counseling   9. Primary osteoarthritis of both hips   10. Need for pneumococcal vaccination      Essential hypertension - Controlled - home monitoring recommended esp if any sx come on--check BS and BP/HR - low salt  Hypertriglyceridemia Can't tolerate fish oil tabs and doesn't want new med  - handouts on diet and lifestyle mod d/c pt  - reck 57mo and will add fibrates if needed  Hyperlipidemia Cont meds - reviewed FLP from March w pt today   Smoking Counseling done regarding disease process and various treatment options.  Declines meds,  Will consider vaping more often  All questions answered  Handouts given if patient desired them    OA (osteoarthritis) of hip rf mobic given to pt- working well and tol well  - exercise 5d/wk  Vitamin D deficiency Obtain level and refill meds given to pt - feeling more energetic/ better since starting it  Diabetes mellitus without complication (HCC) Per Dr Lucianne Muss.     Recent A1c in March came down significantly- from 11.9 to now 7.3 with med changes by Endo- added insulin.  Cont with lifestyle changes as well  smoking cessation counseling 5 min done  Has vapes at home and we discussed using them    Education and routine counseling performed. Handouts provided.   New Prescriptions   No medications on file    Modified Medications   Modified Medication Previous Medication   LISINOPRIL (PRINIVIL,ZESTRIL) 5 MG TABLET lisinopril (PRINIVIL,ZESTRIL) 5 MG tablet      Take 1 tablet (5 mg total) by mouth daily.    Take 1 tablet (5 mg total) by mouth daily.   MELOXICAM (MOBIC) 15 MG TABLET meloxicam (MOBIC) 15 MG tablet      1/2- 1 tab po daily for pain     1/2- 1 tab po daily for pain   VITAMIN D, ERGOCALCIFEROL, (DRISDOL) 50000 UNITS CAPS CAPSULE Vitamin D, Ergocalciferol, (DRISDOL) 50000 units CAPS capsule      Take 1 capsule (50,000 Units total) by mouth every 7 (seven) days.    Take 1 capsule (50,000 Units total) by mouth every 7 (seven) days.    Discontinued Medications   No medications on file     Orders Placed This Encounter  Procedures  . Pneumococcal conjugate vaccine 13-valent  . VITAMIN D 25 Hydroxy (Vit-D Deficiency, Fractures)     Return in about 4 months (around 01/09/2017) for needs Complete physical.  The patient was counseled, risk factors were discussed, anticipatory guidance given.  Gross side effects, risk and benefits, and alternatives of medications discussed with patient.  Patient is aware that all medications have potential side effects and we are unable to predict every side effect or drug-drug interaction that may occur.  Expresses verbal understanding and consents to current therapy plan and treatment regimen.  Please see AVS handed out to patient at the end of our visit for further patient instructions/ counseling done pertaining to today's office visit.    Note: This document was prepared using Dragon voice recognition software and may include unintentional dictation errors.     Subjective:    Chief Complaint  Patient presents with  . Hypertension    HPI:  Jose Munoz is a 66 y.o. male who presents to [facility] today for follow up for HTN.     HTN: Home BP readings have been running in the not checking at home but no sx, no dizziness, vis changes.   Had hard time cutting his  tabs in half so he was taking the .  Recently saw Dr Lucianne Muss and he gave pt new script for  tabs.   Pt has been tolerating meds well.  Taking as prescribed.  Denies HA, dizziness, CP, SOB, Visual changes, increasing pedal edema.   Exercise: yes;  Diet Pattern: good- low card;  Salt  Restriction: none.     One episode sitting living room felt onset shakey and some blurriness in vision.  Took couple swigs of OJ- felt fine.  DM:    Dr Lucianne Muss txs him- doing well, no Sx, cehcks FBS- 130's most mornings  Wt-   doing great still.  Exercising 3d/wk, walks treadmill  Patient Care Team    Relationship Specialty Notifications Start End  Thomasene Lot, DO PCP - General Family Medicine  10/16/15      Wt Readings from Last 3 Encounters:  09/09/16 205 lb 12.8 oz (93.4 kg)  08/13/16 202 lb (91.6 kg)  07/02/16 203 lb (92.1 kg)    BP Readings from Last 3 Encounters:  09/09/16 103/68  08/13/16 102/68  07/02/16 114/76    Pulse Readings from Last 3 Encounters:  09/09/16 86  08/13/16 80  07/02/16 83    BMI Readings from Last 3 Encounters:  09/09/16 29.53 kg/m  08/13/16 28.98 kg/m  07/02/16 27.53 kg/m     Lab Results  Component Value Date   CREATININE 0.72 08/11/2016   BUN 21 08/11/2016   NA 136 08/11/2016   K 3.9 08/11/2016   CL 101 08/11/2016   CO2 28 08/11/2016    Lab Results  Component Value Date   CHOL 126 08/11/2016   CHOL 131 10/18/2015    Lab Results  Component Value Date   HDL 39.40 08/11/2016   HDL 43 10/18/2015    Lab Results  Component Value Date   LDLCALC 51 08/11/2016   LDLCALC 60 10/18/2015    Lab Results  Component Value Date   TRIG 180.0 (H) 08/11/2016   TRIG 142 10/18/2015    Lab Results  Component Value Date   CHOLHDL 3 08/11/2016   CHOLHDL 3.0 10/18/2015    No results found for: LDLDIRECT ===================================================================  Patient Active Problem List   Diagnosis Date Noted  . Hypertriglyceridemia 09/09/2016    Priority: High  . Essential hypertension 10/16/2015    Priority: High  . Diabetes mellitus without complication (HCC) 10/16/2015    Priority: High  . smoking cessation counseling 11/05/2015    Priority: Medium  . Hyperlipidemia 10/16/2015    Priority: Medium   . Smoking 10/16/2015    Priority: Medium  . Overweight (BMI 25.0-29.9) 10/16/2015    Priority: Medium  . OA (osteoarthritis) of hip 11/05/2015    Priority: Low  . Vitamin D deficiency 11/05/2015    Priority: Low    Past Medical History:  Diagnosis Date  . Diabetes mellitus without complication (HCC)   . Essential hypertension 10/16/2015  . Hyperlipidemia   . OA (osteoarthritis) of hip 11/05/2015  . Obese 10/16/2015  . Smoking   . Vitamin D deficiency 11/05/2015    No past surgical history on file.  Family History  Problem Relation Age of Onset  .  Diabetes Mother   . Congestive Heart Failure Mother   . Cancer Sister     breast  . Cancer Sister     cervical  . Heart attack Brother     History  Drug Use No  ,  History  Alcohol Use  . 1.8 oz/week  . 1 Glasses of wine, 2 Shots of liquor per week  ,  History  Smoking Status  . Current Every Day Smoker  . Packs/day: 1.00  . Types: Cigarettes  Smokeless Tobacco  . Never Used  ,    Current Outpatient Prescriptions on File Prior to Visit  Medication Sig Dispense Refill  . ALPRAZolam (XANAX) 0.5 MG tablet Take 1 tablet (0.5 mg total) by mouth as needed for anxiety. 30 tablet 0  . aspirin 81 MG tablet Take 81 mg by mouth daily.    Marland Kitchen glucose blood (ONETOUCH VERIO) test strip Use to test blood sugar 2 times daily- Dx code E11.9 100 each 5  . Insulin Glargine (TOUJEO SOLOSTAR) 300 UNIT/ML SOPN Inject 24 Units into the skin daily. Inject 24 units in the skin in the morning- add 4 units every 4 days until fasting blood sugar falls below 130 (Patient taking differently: Inject 30 Units into the skin daily. Inject 24 units in the skin in the morning- add 4 units every 4 days until fasting blood sugar falls below 130) 5 pen 3  . insulin lispro (HUMALOG KWIKPEN) 100 UNIT/ML KiwkPen Inject 8 to 10 units before main meal (Patient taking differently: 10 Units. Inject 8 to 10 units before main meal) 4 pen 11  . Insulin Pen Needle 32G  X 4 MM MISC 1 Syringe by Does not apply route daily. 30 each 3  . INVOKAMET [phone] MG TABS Take 2 tablets by mouth daily. 60 tablet 3  . Lancets (ONETOUCH ULTRASOFT) lancets Use to test blood sugar 2 times daily- Dx code E11.9 100 each 12  . simvastatin (ZOCOR) 40 MG tablet Take 1 tablet by mouth daily.  0  . insulin aspart (NOVOLOG FLEXPEN) 100 UNIT/ML FlexPen Inject 8 to 10 units before main meal (Patient not taking: Reported on 09/09/2016) 4 pen 3   No current facility-administered medications on file prior to visit.     No Known Allergies  Review of Systems  Constitutional: Negative for diaphoresis and weight loss.  HENT: Negative for nosebleeds.   Eyes: Negative for blurred vision and double vision.  Respiratory: Negative for shortness of breath and wheezing.   Cardiovascular: Negative for chest pain, palpitations, orthopnea and claudication.  Gastrointestinal: Negative for diarrhea, nausea and vomiting.  Musculoskeletal: Negative for falls and myalgias.  Skin: Negative for rash.  Neurological: Negative for dizziness and focal weakness.  Endo/Heme/Allergies: Negative for polydipsia.  Psychiatric/Behavioral: Negative for memory loss.    Objective:   Blood pressure 103/68, pulse 86, height  (1.778 m), weight 205 lb 12.8 oz (93.4 kg). Body mass index is 29.53 kg/m. General: Well Developed, well nourished, and in no acute distress.  HEENT: Normocephalic, atraumatic, pupils equal round reactive to light, neck supple, No carotid bruits, no JVD Skin: Warm and dry, cap RF less 2 sec Cardiac: Regular rate and rhythm, S1, S2 WNL's, no murmurs rubs or gallops Respiratory: ECTA B/L, Not using accessory muscles, speaking in full sentences. NeuroM-Sk: Ambulates w/o assistance, moves ext * 4 w/o difficulty, sensation grossly intact.  Ext: scant edema b/l lower ext Psych: No HI/SI, judgement and insight good, Euthymic mood. Full Affect.

## 2016-09-09 NOTE — Patient Instructions (Signed)
Try to exercise/ walk 5 days per week is ideal  - consider going to vapes   Food Choices to Lower Your Triglycerides Triglycerides are a type of fat in your blood. High levels of triglycerides can increase the risk of heart disease and stroke. If your triglyceride levels are high, the foods you eat and your eating habits are very important. Choosing the right foods can help lower your triglycerides. What general guidelines do I need to follow?  Lose weight if you are overweight.  Limit or avoid alcohol.  Fill one half of your plate with vegetables and green salads.  Limit fruit to two servings a day. Choose fruit instead of juice.  Make one fourth of your plate whole grains. Look for the word "whole" as the first word in the ingredient list.  Fill one fourth of your plate with lean protein foods.  Enjoy fatty fish (such as salmon, mackerel, sardines, and tuna) three times a week.  Choose healthy fats.  Limit foods high in starch and sugar.  Eat more home-cooked food and less restaurant, buffet, and fast food.  Limit fried foods.  Cook foods using methods other than frying.  Limit saturated fats.  Check ingredient lists to avoid foods with partially hydrogenated oils (trans fats) in them. What foods can I eat? Grains  Whole grains, such as whole wheat or whole grain breads, crackers, cereals, and pasta. Unsweetened oatmeal, bulgur, barley, quinoa, or brown rice. Corn or whole wheat flour tortillas. Vegetables  Fresh or frozen vegetables (raw, steamed, roasted, or grilled). Green salads. Fruits  All fresh, canned (in natural juice), or frozen fruits. Meat and Other Protein Products  Ground beef (85% or leaner), grass-fed beef, or beef trimmed of fat. Skinless chicken or [place]. Ground chicken or [place]. Pork trimmed of fat. All fish and seafood. Eggs. Dried beans, peas, or lentils. Unsalted nuts or seeds. Unsalted canned or dry beans. Dairy  Low-fat dairy products,  such as skim or 1% milk, 2% or reduced-fat cheeses, low-fat ricotta or cottage cheese, or plain low-fat yogurt. Fats and Oils  Tub margarines without trans fats. Light or reduced-fat mayonnaise and salad dressings. Avocado. Safflower, olive, or canola oils. Natural peanut or almond butter. The items listed above may not be a complete list of recommended foods or beverages. Contact your dietitian for more options.  What foods are not recommended? Grains  White bread. White pasta. White rice. Cornbread. Bagels, pastries, and croissants. Crackers that contain trans fat. Vegetables  White potatoes. Corn. Creamed or fried vegetables. Vegetables in a cheese sauce. Fruits  Dried fruits. Canned fruit in light or heavy syrup. Fruit juice. Meat and Other Protein Products  Fatty cuts of meat. Ribs, chicken wings, bacon, sausage, bologna, salami, chitterlings, fatback, hot dogs, bratwurst, and packaged luncheon meats. Dairy  Whole or 2% milk, cream, half-and-half, and cream cheese. Whole-fat or sweetened yogurt. Full-fat cheeses. Nondairy creamers and whipped toppings. Processed cheese, cheese spreads, or cheese curds. Sweets and Desserts  Corn syrup, sugars, honey, and molasses. Candy. Jam and jelly. Syrup. Sweetened cereals. Cookies, pies, cakes, donuts, muffins, and ice cream. Fats and Oils  Butter, stick margarine, lard, shortening, ghee, or bacon fat. Coconut, palm kernel, or palm oils. Beverages  Alcohol. Sweetened drinks (such as sodas, lemonade, and fruit drinks or punches). The items listed above may not be a complete list of foods and beverages to avoid. Contact your dietitian for more information.  This information is not intended to replace advice given to you  by your health care provider. Make sure you discuss any questions you have with your health care provider. Document Released: 02/20/2004 Document Revised: 10/10/2015 Document Reviewed: 03/08/2013 Elsevier Interactive Patient Education   2017 [employer].

## 2016-09-09 NOTE — Assessment & Plan Note (Signed)
Per Dr Lucianne Muss.     Recent A1c in March came down significantly- from 11.9 to now 7.3 with med changes by Endo- added insulin.  Cont with lifestyle changes as well

## 2016-09-09 NOTE — Assessment & Plan Note (Signed)
-   Controlled - home monitoring recommended esp if any sx come on--check BS and BP/HR - low salt

## 2016-09-10 LAB — VITAMIN D 25 HYDROXY (VIT D DEFICIENCY, FRACTURES): Vit D, 25-Hydroxy: 50.5 ng/mL (ref 30.0–100.0)

## 2016-10-06 ENCOUNTER — Other Ambulatory Visit: Payer: Self-pay | Admitting: Endocrinology

## 2016-10-06 ENCOUNTER — Other Ambulatory Visit: Payer: Self-pay

## 2016-10-06 MED ORDER — INSULIN GLARGINE 300 UNIT/ML ~~LOC~~ SOPN: 30.0000 [IU] | PEN_INJECTOR | Freq: Every day | SUBCUTANEOUS | 1 refills | Status: DC

## 2016-11-02 ENCOUNTER — Telehealth: Payer: Self-pay | Admitting: Endocrinology

## 2016-11-02 MED ORDER — INSULIN PEN NEEDLE 32G X 4 MM MISC: 3 refills | Status: DC

## 2016-11-02 NOTE — Telephone Encounter (Signed)
Refill submitted. 

## 2016-11-02 NOTE — Telephone Encounter (Signed)
Patient is requesting a script sent to [facility] on [address] for bd ultra fine pen needles.

## 2016-11-13 ENCOUNTER — Other Ambulatory Visit (INDEPENDENT_AMBULATORY_CARE_PROVIDER_SITE_OTHER): Payer: PPO

## 2016-11-13 DIAGNOSIS — Z794 Long term (current) use of insulin: Secondary | ICD-10-CM | POA: Diagnosis not present

## 2016-11-13 DIAGNOSIS — E1165 Type 2 diabetes mellitus with hyperglycemia: Secondary | ICD-10-CM

## 2016-11-13 LAB — BASIC METABOLIC PANEL
BUN: 16 mg/dL (ref 6–23)
CHLORIDE: 102 meq/L (ref 96–112)
CO2: 27 meq/L (ref 19–32)
Calcium: 9.8 mg/dL (ref 8.4–10.5)
Creatinine, Ser: 0.74 mg/dL (ref 0.40–1.50)
GFR: 111.67 mL/min (ref >60.00)
Glucose, Bld: 118 mg/dL — ABNORMAL HIGH (ref 70–99)
POTASSIUM: 4 meq/L (ref 3.5–5.1)
Sodium: 136 mEq/L (ref 135–145)

## 2016-11-13 LAB — HEMOGLOBIN A1C: Hgb A1c MFr Bld: 6.8 % — ABNORMAL HIGH (ref 4.6–6.5)

## 2016-11-17 ENCOUNTER — Ambulatory Visit (INDEPENDENT_AMBULATORY_CARE_PROVIDER_SITE_OTHER): Payer: PPO | Admitting: Endocrinology

## 2016-11-17 ENCOUNTER — Encounter: Payer: Self-pay | Admitting: Endocrinology

## 2016-11-17 VITALS — BP 116/74 | HR 85 | Ht 70.0 in | Wt 204.8 lb

## 2016-11-17 DIAGNOSIS — Z794 Long term (current) use of insulin: Secondary | ICD-10-CM

## 2016-11-17 DIAGNOSIS — E1165 Type 2 diabetes mellitus with hyperglycemia: Secondary | ICD-10-CM

## 2016-11-17 NOTE — Patient Instructions (Signed)
Check blood sugars on waking up 2-3/7  Also check blood sugars about 2 hours after a meal and do this after different meals by rotation  Recommended blood sugar levels on waking up is 90-130 and about 2 hours after meal is 130-160  Please bring your blood sugar monitor to each visit, thank you  Humalog 4-6 at Bfst also; keep reading

## 2016-11-17 NOTE — Progress Notes (Signed)
Patient ID: Jose Munoz, male   DOB: 1950/07/03, 66 y.o.   MRN: [id number]           Reason for Appointment:  for Type 2 Diabetes  Referring physician: Opalski   History of Present Illness:          Date of diagnosis of type 2 diabetes mellitus: 1998       Background history:   He thinks his diabetes was relatively easier to control and the onset even though he had initial symptoms of thirst and weight loss He had done very well with his diet and lost a significant amount of weight and was approved to control his diabetes without medications for a few years Subsequently he started with oral medications including metformin, Actos and Amaryl He thinks his blood sugars were well controlled for a few years initially but started getting higher progressively since 2016.  Recent history:   Because of poor control he was started on basal bolus insulin regimen on his initial consultation, highest A1c was 11.9 in 01/2016 A1c is now down to 6.8, previously 7.3  INSULIN regimen: TOUJEO 30 units daily, Humalog 10-14 units at supper  Non-insulin hypoglycemic drugs the patient is taking are:Invokamet 150/1000 twice a day  Current management, blood sugar patterns and problems identified:  He has not checked his readings on waking up regularly and they seem to be somewhat variable, lab glucose was 118 after coffee  He is taking his Toujeo at around 8 in the morning and taking Invokamet about 11 AM  He sometimes does eat breakfast with some carbohydrate but does not take Humalog at that time  With checking his blood sugars after breakfast and lunch also he is sometimes finding his blood sugars to be over 200 but has not started taking mealtime insulin  He does adjust his suppertime dose somewhat based on what he is eating  Fasting blood sugars are now better under control with using 30 units total Toujeo  Invokamet has been continued unchanged.Marland Kitchen  His weight has stayed the  same  Hypoglycemia: He had one episode where he felt shaky and had distortion of his vision, relieved by orange juice even though he says his blood sugar was 147       Side effects from medications have been: None  Compliance with the medical regimen: Fair  Glucose monitoring:  done less than 1 times a day         Glucometer:   Contour  Blood sugars by download:  Mean values apply above for all meters except median for One Touch  PRE-MEAL Fasting Lunch Dinner Bedtime Overall  Glucose range: 115-156       Mean/median:     149+/-35    POST-MEAL PC Breakfast PC Lunch PC Dinner  Glucose range:  119-190  124-172   Mean/median:  1 29-243  166  140      Self-care: The diet that the patient has been following is: tries to limit sweets and drinks with sugar .      Typical meal intake: Breakfast is egg, toast occasionally, sometimes skipped or; Lunch is a sandwich, has various snacks              Dietician visit, most recent:20 Years ago CDE visit: 11/17               Exercise:  walk on treadmill, averaging 3/7 days a week  Weight history:  Wt Readings from Last 3 Encounters:  11/17/16 204 lb  12.8 oz (92.9 kg)  09/09/16 205 lb 12.8 oz (93.4 kg)  08/13/16 202 lb (91.6 kg)    Glycemic control:   Lab Results  Component Value Date   HGBA1C 6.8 (H) 11/13/2016   HGBA1C 7.3 (H) 08/11/2016   HGBA1C 11.9 02/11/2016   Lab Results  Component Value Date   MICROALBUR 4.3 (H) 08/11/2016   LDLCALC 51 08/11/2016   CREATININE 0.74 11/13/2016   Lab Results  Component Value Date   MICRALBCREAT 5.2 08/11/2016       Allergies as of 11/17/2016   No Known Allergies     Medication List       Accurate as of 11/17/16  1:33 PM. Always use your most recent med list.          ALPRAZolam 0.5 MG tablet Commonly known as:  XANAX Take 1 tablet (0.5 mg total) by mouth as needed for anxiety.   aspirin 81 MG tablet Take 81 mg by mouth daily.   glucose blood test strip Commonly known  as:  ONETOUCH VERIO Use to test blood sugar 2 times daily- Dx code E11.9   insulin aspart 100 UNIT/ML FlexPen Commonly known as:  NOVOLOG FLEXPEN Inject 8 to 10 units before main meal   Insulin Glargine 300 UNIT/ML Sopn Commonly known as:  TOUJEO SOLOSTAR Inject 30 Units into the skin daily.   insulin lispro 100 UNIT/ML KiwkPen Commonly known as:  HUMALOG KWIKPEN Inject 8 to 10 units before main meal   Insulin Pen Needle 32G X 4 MM Misc Use to inject insulin 2 times per day.   INVOKAMET [phone] MG Tabs Generic drug:  Canagliflozin-Metformin HCl Take 2 tablets by mouth daily.   lisinopril 5 MG tablet Commonly known as:  PRINIVIL,ZESTRIL Take 1 tablet (5 mg total) by mouth daily.   meloxicam 15 MG tablet Commonly known as:  MOBIC 1/2- 1 tab po daily for pain   onetouch ultrasoft lancets Use to test blood sugar 2 times daily- Dx code E11.9   simvastatin 40 MG tablet Commonly known as:  ZOCOR Take 1 tablet by mouth daily.   Vitamin D (Ergocalciferol) 50000 units Caps capsule Commonly known as:  DRISDOL Take 1 capsule (50,000 Units total) by mouth every 7 (seven) days.       Allergies: No Known Allergies  Past Medical History:  Diagnosis Date  . Diabetes mellitus without complication (HCC)   . Essential hypertension 10/16/2015  . Hyperlipidemia   . OA (osteoarthritis) of hip 11/05/2015  . Obese 10/16/2015  . Smoking   . Vitamin D deficiency 11/05/2015    No past surgical history on file.  Family History  Problem Relation Age of Onset  . Diabetes Mother   . Congestive Heart Failure Mother   . Cancer Sister        breast  . Cancer Sister        cervical  . Heart attack Brother     Social History:  reports that he has been smoking Cigarettes.  He has been smoking about 1.00 pack per day. He has never used smokeless tobacco. He reports that he drinks about 1.8 oz of alcohol per week . He reports that he does not use drugs.   Review of Systems   Lipid  history: Has good LDL levels without statin drugs However triglycerides and HDL are borderline   Lab Results  Component Value Date   CHOL 126 08/11/2016   HDL 39.40 08/11/2016   LDLCALC 51 08/11/2016   TRIG  180.0 (H) 08/11/2016   CHOLHDL 3 08/11/2016           Hypertension: Minimal, is being Treated with lisinopril 5 mg  The dose was reduced with starting Invokana   BP Readings from Last 3 Encounters:  11/17/16 116/74  09/09/16 103/68  08/13/16 102/68     Most recent eye exam was 8/17, reportedly not having retinopathy but is being watched for glaucoma  Most recent foot exam: 11/17   LABS:  Lab on 11/13/2016  Component Date Value Ref Range Status  . Hgb A1c MFr Bld 11/13/2016 6.8* 4.6 - 6.5 % Final   Glycemic Control Guidelines for People with Diabetes:Non Diabetic:  <6%Goal of Therapy: <7%Additional Action Suggested:  >8%   . Sodium 11/13/2016 136  135 - 145 mEq/L Final  . Potassium 11/13/2016 4.0  3.5 - 5.1 mEq/L Final  . Chloride 11/13/2016 102  96 - 112 mEq/L Final  . CO2 11/13/2016 27  19 - 32 mEq/L Final  . Glucose, Bld 11/13/2016 118* 70 - 99 mg/dL Final  . BUN 30/86/5784 16  6 - 23 mg/dL Final  . Creatinine, Ser 11/13/2016 0.74  0.40 - 1.50 mg/dL Final  . Calcium 69/62/9528 9.8  8.4 - 10.5 mg/dL Final  . GFR 41/32/4401 111.67  >60.00 mL/min Final    Physical Examination:  BP 116/74   Pulse 85   Ht 5\' 10"  (1.778 m)   Wt 204 lb 12.8 oz (92.9 kg)   SpO2 97%   BMI 29.39 kg/m    ASSESSMENT:  Diabetes type 2, uncontrolled  See history of present illness for detailed discussion of current diabetes management, blood sugar patterns and problems identified  He is doing very well now with using basal bolus insulin regimen  Recent A1c was 6.7 and at target His fasting readings are not checked consistently but they do not appear to be consistently high with his Toujeo regimen  His highest readings are probably after breakfast when he does not take any  Humalog He does try to take somewhat variable doses of Humalog based on carbohydrate intake and the evening  HYPERTENSION: His blood pressure is still controlled with 5 mg of lisinopril   PLAN:     Discussed needing to cover his breakfast if he is eating a meal in the morning with at least 4-6 units Humalog  He will also need to adjust his suppertime dose based on amount of carbohydrate and also keep the postprandial readings at least under 180 on the time  Continue regular exercise  Continue Invokamet unchanged   Patient Instructions  Check blood sugars on waking up 2-3/7  Also check blood sugars about 2 hours after a meal and do this after different meals by rotation  Recommended blood sugar levels on waking up is 90-130 and about 2 hours after meal is 130-160  Please bring your blood sugar monitor to each visit, thank you  Humalog 4-6 at Bfst also; keep reading       [facility] 11/17/2016, 1:33 PM   Note: This office note was prepared with Insurance underwriter. Any transcriptional errors that result from this process are unintentional.

## 2016-11-23 ENCOUNTER — Other Ambulatory Visit: Payer: Self-pay | Admitting: Endocrinology

## 2016-12-08 DIAGNOSIS — H40053 Ocular hypertension, bilateral: Secondary | ICD-10-CM | POA: Diagnosis not present

## 2016-12-16 ENCOUNTER — Telehealth: Payer: Self-pay | Admitting: Family Medicine

## 2016-12-16 ENCOUNTER — Other Ambulatory Visit: Payer: Self-pay

## 2016-12-16 MED ORDER — SIMVASTATIN 40 MG PO TABS: 40.0000 mg | ORAL_TABLET | Freq: Every day | ORAL | 0 refills | Status: DC

## 2016-12-16 NOTE — Telephone Encounter (Signed)
Sent to pharmacy, called the patient left message.  MPulliam, CMA/RT(R)

## 2016-12-16 NOTE — Telephone Encounter (Signed)
Patient is requesting a refill of the simvastatin 40 mg [facility] [address]

## 2016-12-16 NOTE — Telephone Encounter (Signed)
Patient called for refill on Simvastatin 40 mg - sent to pharmacy. MPulliam, CMA/RT(R)

## 2016-12-18 ENCOUNTER — Telehealth: Payer: Self-pay

## 2016-12-18 ENCOUNTER — Other Ambulatory Visit: Payer: Self-pay

## 2016-12-18 MED ORDER — INSULIN ASPART 100 UNIT/ML FLEXPEN: PEN_INJECTOR | SUBCUTANEOUS | 3 refills | Status: DC

## 2016-12-18 NOTE — Telephone Encounter (Signed)
Called patient and let him know that Humalog needs PA but we will switch to Novolog if it is ok with him

## 2016-12-18 NOTE — Telephone Encounter (Signed)
Patient called back to advise that it's ok to switch to Novolog.  Thank you,  -LL

## 2016-12-18 NOTE — Telephone Encounter (Signed)
Patient has been switched back to Novolog and new prescription has been ordered

## 2017-01-01 ENCOUNTER — Telehealth: Payer: Self-pay | Admitting: Endocrinology

## 2017-01-01 ENCOUNTER — Other Ambulatory Visit: Payer: Self-pay

## 2017-01-01 MED ORDER — INSULIN GLARGINE 300 UNIT/ML ~~LOC~~ SOPN: 30.0000 [IU] | PEN_INJECTOR | Freq: Every day | SUBCUTANEOUS | 1 refills | Status: DC

## 2017-01-01 NOTE — Telephone Encounter (Signed)
MEDICATION: Insulin Glargine (TOUJEO SOLOSTAR) 300 UNIT/ML SOPN  PHARMACY:  [id number] - [place], [place] - [address] [phone] (Phone) [phone] (Fax)       IS THIS A 90 DAY SUPPLY : Y  IS PATIENT OUT OF MEDICTAION: N  IF NOT; HOW MUCH IS LEFT: few days left  LAST APPOINTMENT DATE: 11/17/16  NEXT APPOINTMENT DATE: 02/17/17  OTHER COMMENTS:    **Let patient know to contact pharmacy at the end of the day to make sure medication is ready. **  ** Please notify patient to allow 48-72 hours to process**  **Encourage patient to contact the pharmacy for refills or they can request refills through [facility]**

## 2017-01-01 NOTE — Telephone Encounter (Signed)
Submitted

## 2017-02-03 ENCOUNTER — Ambulatory Visit: Payer: PPO | Admitting: Family Medicine

## 2017-02-08 DIAGNOSIS — H40053 Ocular hypertension, bilateral: Secondary | ICD-10-CM | POA: Diagnosis not present

## 2017-02-08 LAB — HM DIABETES EYE EXAM

## 2017-02-11 ENCOUNTER — Other Ambulatory Visit (INDEPENDENT_AMBULATORY_CARE_PROVIDER_SITE_OTHER): Payer: PPO

## 2017-02-11 DIAGNOSIS — E1165 Type 2 diabetes mellitus with hyperglycemia: Secondary | ICD-10-CM

## 2017-02-11 DIAGNOSIS — Z794 Long term (current) use of insulin: Secondary | ICD-10-CM

## 2017-02-11 LAB — COMPREHENSIVE METABOLIC PANEL
ALT: 22 U/L (ref 0–53)
AST: 16 U/L (ref 0–37)
Albumin: 4.4 g/dL (ref 3.5–5.2)
Alkaline Phosphatase: 49 U/L (ref 39–117)
BUN: 15 mg/dL (ref 6–23)
CHLORIDE: 102 meq/L (ref 96–112)
CO2: 26 meq/L (ref 19–32)
CREATININE: 0.64 mg/dL (ref 0.40–1.50)
Calcium: 9.5 mg/dL (ref 8.4–10.5)
GFR: 131.94 mL/min (ref >60.00)
GLUCOSE: 101 mg/dL — AB (ref 70–99)
Potassium: 4 mEq/L (ref 3.5–5.1)
SODIUM: 134 meq/L — AB (ref 135–145)
Total Bilirubin: 0.5 mg/dL (ref 0.2–1.2)
Total Protein: 7.3 g/dL (ref 6.0–8.3)

## 2017-02-11 LAB — HEMOGLOBIN A1C: Hgb A1c MFr Bld: 7 % — ABNORMAL HIGH (ref 4.6–6.5)

## 2017-02-17 ENCOUNTER — Encounter: Payer: Self-pay | Admitting: Endocrinology

## 2017-02-17 ENCOUNTER — Ambulatory Visit (INDEPENDENT_AMBULATORY_CARE_PROVIDER_SITE_OTHER): Payer: PPO | Admitting: Endocrinology

## 2017-02-17 VITALS — BP 118/76 | HR 88 | Ht 70.0 in | Wt 205.4 lb

## 2017-02-17 DIAGNOSIS — E1165 Type 2 diabetes mellitus with hyperglycemia: Secondary | ICD-10-CM

## 2017-02-17 DIAGNOSIS — Z794 Long term (current) use of insulin: Secondary | ICD-10-CM | POA: Diagnosis not present

## 2017-02-17 NOTE — Patient Instructions (Addendum)
Check blood sugars on waking up  3/7  Also check blood sugars about 2 hours after a meal and do this after different meals by rotation  Recommended blood sugar levels on waking up is 90-130 and about 2 hours after meal is 130-160  Please bring your blood sugar monitor to each visit, thank you  Toujeo Max: check cost

## 2017-02-17 NOTE — Progress Notes (Signed)
Patient ID: Jose Munoz, male   DOB: Jul 23, 1950, 66 y.o.   MRN: [id number]           Reason for Appointment:  Follow-up for Type 2 Diabetes  Referring physician: Opalski   History of Present Illness:          Date of diagnosis of type 2 diabetes mellitus: 1998       Background history:   He thinks his diabetes was relatively easier to control and the onset even though he had initial symptoms of thirst and weight loss He had done very well with his diet and lost a significant amount of weight and was approved to control his diabetes without medications for a few years Subsequently he started with oral medications including metformin, Actos and Amaryl He thinks his blood sugars were well controlled for a few years initially but started getting higher progressively since 2016. Because of poor control he was started on basal bolus insulin regimen on his initial consultation, highest A1c was 11.9 in 01/2016  Recent history:    A1c is now 7%, previously range 0.8-7.3  INSULIN regimen: TOUJEO 30 units daily, NovoLog 10-14 units at supper  Non-insulin hypoglycemic drugs the patient is taking are:Invokamet 150/1000 twice a day  Current management, blood sugar patterns and problems identified:  He has not being consistent with taking his NovoLog before meals  This was changed from Humalog Mix out of insurance benefits  He says he forgets to take his insulin before he is eating and at times he has taken this couple of hours after eating also  HIGHEST blood sugar was 199 after his main meal in the afternoon after eating meatballs and pasta  His blood sugars are variable in the mornings but frequently is checking them after drinking up to 3 cups of coffee  However he is more regular with taking his morning Toujeo and Invokamet, as directed  More recently because of hip pain he has not been walking as much  His weight has stayed the same  Hypoglycemia: He had one episode where he  felt shaky and had distortion of his vision, relieved by orange juice even though he says his blood sugar was 147       Side effects from medications have been: None  Compliance with the medical regimen: Fair  Glucose monitoring:  done less than 1 times a day         Glucometer:   One Touch Verio  Blood sugars by download:  Mean values apply above for all meters except median for One Touch  PRE-MEAL Fasting Lunch Dinner Bedtime Overall  Glucose range: 121-157  94 111, 199  162    Mean/median:     137      Self-care: The diet that the patient has been following is: tries to limit sweets and drinks with sugar .      Typical meal intake: Breakfast is egg, toast occasionally, sometimes skipped or; Lunch is a sandwich, has various snacks    Dinner 5-7 P.m.            Dietician visit, most recent: 06/2016 CDE visit: 11/17               Exercise:  walks on treadmill, averaging 1-3/7 days a week  Weight history:  Wt Readings from Last 3 Encounters:  02/17/17 205 lb 6.4 oz (93.2 kg)  11/17/16 204 lb 12.8 oz (92.9 kg)  09/09/16 205 lb 12.8 oz (93.4 kg)  Glycemic control:   Lab Results  Component Value Date   HGBA1C 7.0 (H) 02/11/2017   HGBA1C 6.8 (H) 11/13/2016   HGBA1C 7.3 (H) 08/11/2016   Lab Results  Component Value Date   MICROALBUR 4.3 (H) 08/11/2016   LDLCALC 51 08/11/2016   CREATININE 0.64 02/11/2017   Lab Results  Component Value Date   MICRALBCREAT 5.2 08/11/2016       Allergies as of 02/17/2017   No Known Allergies     Medication List       Accurate as of 02/17/17 12:37 PM. Always use your most recent med list.          ALPRAZolam 0.5 MG tablet Commonly known as:  XANAX Take 1 tablet (0.5 mg total) by mouth as needed for anxiety.   aspirin 81 MG tablet Take 81 mg by mouth daily.   glucose blood test strip Commonly known as:  ONETOUCH VERIO Use to test blood sugar 2 times daily- Dx code E11.9   insulin aspart 100 UNIT/ML  FlexPen Commonly known as:  NOVOLOG FLEXPEN Inject 8 to 10 units before main meal   Insulin Glargine 300 UNIT/ML Sopn Commonly known as:  TOUJEO SOLOSTAR Inject 30 Units into the skin daily.   Insulin Pen Needle 32G X 4 MM Misc Use to inject insulin 2 times per day.   INVOKAMET [phone] MG Tabs Generic drug:  Canagliflozin-Metformin HCl take 2 tablets by mouth once daily   lisinopril 5 MG tablet Commonly known as:  PRINIVIL,ZESTRIL Take 1 tablet (5 mg total) by mouth daily.   meloxicam 15 MG tablet Commonly known as:  MOBIC 1/2- 1 tab po daily for pain   onetouch ultrasoft lancets Use to test blood sugar 2 times daily- Dx code E11.9   simvastatin 40 MG tablet Commonly known as:  ZOCOR Take 1 tablet (40 mg total) by mouth daily.   Vitamin D (Ergocalciferol) 50000 units Caps capsule Commonly known as:  DRISDOL Take 1 capsule (50,000 Units total) by mouth every 7 (seven) days.       Allergies: No Known Allergies  Past Medical History:  Diagnosis Date  . Diabetes mellitus without complication (HCC)   . Essential hypertension 10/16/2015  . Hyperlipidemia   . OA (osteoarthritis) of hip 11/05/2015  . Obese 10/16/2015  . Smoking   . Vitamin D deficiency 11/05/2015    No past surgical history on file.  Family History  Problem Relation Age of Onset  . Diabetes Mother   . Congestive Heart Failure Mother   . Cancer Sister        breast  . Cancer Sister        cervical  . Heart attack Brother     Social History:  reports that he has been smoking Cigarettes.  He has been smoking about 1.00 pack per day. He has never used smokeless tobacco. He reports that he drinks about 1.8 oz of alcohol per week . He reports that he does not use drugs.   Review of Systems   Lipid history: Has good LDL levels without statin drugs However triglycerides and HDL are borderline   Lab Results  Component Value Date   CHOL 126 08/11/2016   HDL 39.40 08/11/2016   LDLCALC 51  08/11/2016   TRIG 180.0 (H) 08/11/2016   CHOLHDL 3 08/11/2016           Hypertension:  is being Treated with lisinopril 5 mg  The dose was reduced with starting Invokana   BP  Readings from Last 3 Encounters:  02/17/17 118/76  11/17/16 116/74  09/09/16 103/68     Most recent eye exam was Recently, reportedly not having retinopathy but is being watched for glaucoma  Most recent foot exam: 11/17   LABS:  Lab on 02/11/2017  Component Date Value Ref Range Status  . Hgb A1c MFr Bld 02/11/2017 7.0* 4.6 - 6.5 % Final   Glycemic Control Guidelines for People with Diabetes:Non Diabetic:  <6%Goal of Therapy: <7%Additional Action Suggested:  >8%   . Sodium 02/11/2017 134* 135 - 145 mEq/L Final  . Potassium 02/11/2017 4.0  3.5 - 5.1 mEq/L Final  . Chloride 02/11/2017 102  96 - 112 mEq/L Final  . CO2 02/11/2017 26  19 - 32 mEq/L Final  . Glucose, Bld 02/11/2017 101* 70 - 99 mg/dL Final  . BUN 62/13/0865 15  6 - 23 mg/dL Final  . Creatinine, Ser 02/11/2017 0.64  0.40 - 1.50 mg/dL Final  . Total Bilirubin 02/11/2017 0.5  0.2 - 1.2 mg/dL Final  . Alkaline Phosphatase 02/11/2017 49  39 - 117 U/L Final  . AST 02/11/2017 16  0 - 37 U/L Final  . ALT 02/11/2017 22  0 - 53 U/L Final  . Total Protein 02/11/2017 7.3  6.0 - 8.3 g/dL Final  . Albumin 78/46/9629 4.4  3.5 - 5.2 g/dL Final  . Calcium 52/84/1324 9.5  8.4 - 10.5 mg/dL Final  . GFR 40/02/2724 131.94  >60.00 mL/min Final    Physical Examination:  BP 118/76   Pulse 88   Ht  (1.778 m)   Wt 205 lb 6.4 oz (93.2 kg)   SpO2 96%   BMI 29.47 kg/m    ASSESSMENT:  Diabetes type 2, uncontrolled  See history of present illness for detailed discussion of current diabetes management, blood sugar patterns and problems identified  He is doing well now with using Basal insulin along with once a day Novolog and Invokamet  Although his A1c is slightly higher than before at 7% this may be related to his not taking his NovoLog  consistently before eating Also diet can be still somewhat better He does not check enough readings after meals and discussed that this would help him adjust his insulin doses better at suppertime Currently not doing as much walking because of hip pain  PLAN:     No change in his basic regimen  He needs to be consistent with taking his NovoLog before eating and he can take this when he starts cooking  Also he needs to be checking more readings after meals as discussed above and discussed blood sugar targets  He is concerned about the cost of medications and he can check the pricing for Toujeo max, currently does not appear to be covered   Patient Instructions  Check blood sugars on waking up  3/7  Also check blood sugars about 2 hours after a meal and do this after different meals by rotation  Recommended blood sugar levels on waking up is 90-130 and about 2 hours after meal is 130-160  Please bring your blood sugar monitor to each visit, thank you  Toujeo Max: check cost      [facility] 02/17/2017, 12:37 PM   Note: This office note was prepared with Insurance underwriter. Any transcriptional errors that result from this process are unintentional.

## 2017-03-05 ENCOUNTER — Other Ambulatory Visit: Payer: Self-pay | Admitting: Family Medicine

## 2017-03-17 ENCOUNTER — Ambulatory Visit: Payer: PPO | Admitting: Family Medicine

## 2017-03-23 ENCOUNTER — Other Ambulatory Visit: Payer: Self-pay | Admitting: Endocrinology

## 2017-04-06 ENCOUNTER — Other Ambulatory Visit: Payer: Self-pay | Admitting: Family Medicine

## 2017-05-17 ENCOUNTER — Encounter: Payer: Self-pay | Admitting: Family Medicine

## 2017-05-17 ENCOUNTER — Ambulatory Visit (INDEPENDENT_AMBULATORY_CARE_PROVIDER_SITE_OTHER): Payer: PPO | Admitting: Family Medicine

## 2017-05-17 VITALS — BP 132/83 | HR 95 | Ht 70.0 in | Wt 209.3 lb

## 2017-05-17 DIAGNOSIS — Z1212 Encounter for screening for malignant neoplasm of rectum: Secondary | ICD-10-CM | POA: Diagnosis not present

## 2017-05-17 DIAGNOSIS — F17200 Nicotine dependence, unspecified, uncomplicated: Secondary | ICD-10-CM | POA: Diagnosis not present

## 2017-05-17 DIAGNOSIS — Z1211 Encounter for screening for malignant neoplasm of colon: Secondary | ICD-10-CM

## 2017-05-17 DIAGNOSIS — Z23 Encounter for immunization: Secondary | ICD-10-CM | POA: Diagnosis not present

## 2017-05-17 DIAGNOSIS — Z720 Tobacco use: Secondary | ICD-10-CM

## 2017-05-17 DIAGNOSIS — Z0000 Encounter for general adult medical examination without abnormal findings: Secondary | ICD-10-CM | POA: Diagnosis not present

## 2017-05-17 DIAGNOSIS — Z136 Encounter for screening for cardiovascular disorders: Secondary | ICD-10-CM | POA: Diagnosis not present

## 2017-05-17 DIAGNOSIS — Z122 Encounter for screening for malignant neoplasm of respiratory organs: Secondary | ICD-10-CM

## 2017-05-17 NOTE — Patient Instructions (Addendum)
My CMA will input the order for a colonoscopy, for an abdominal ultrasound for AAA screening and as well a low-dose CT scan for lung cancer screening.  These 3 will be ordered today - if you have not heard from [place] by the end of the week or next week, please call and speak with Dorothea Ogle who does our referrals for further information about these appointments  You will get your flu shot today.  In the future you can make an appointment for the Shingrex vaccine with a "medical assistant only" appointment at your convenience  Please see your vaccine information sheet on Shingrix vaccine.  Call your insurance and find out how much money it will be for you    Preventive Care for Adults, Male A healthy lifestyle and preventive care can promote health and wellness. Preventive health guidelines for men include the following key practices:  A routine yearly physical is a good way to check with your health care provider about your health and preventative screening. It is a chance to share any concerns and updates on your health and to receive a thorough exam.  Visit your dentist for a routine exam and preventative care every 6 months. Brush your teeth twice a day and floss once a day. Good oral hygiene prevents tooth decay and gum disease.  The frequency of eye exams is based on your age, health, family medical history, use of contact lenses, and other factors. Follow your health care provider's recommendations for frequency of eye exams.  Eat a healthy diet. Foods such as vegetables, fruits, whole grains, low-fat dairy products, and lean protein foods contain the nutrients you need without too many calories. Decrease your intake of foods high in solid fats, added sugars, and salt. Eat the right amount of calories for you.Get information about a proper diet from your health care provider, if necessary.  Regular physical exercise is one of the most important things you can do for your health. Most adults  should get at least 150 minutes of moderate-intensity exercise (any activity that increases your heart rate and causes you to sweat) each week. In addition, most adults need muscle-strengthening exercises on 2 or more days a week.  Maintain a healthy weight. The body mass index (BMI) is a screening tool to identify possible weight problems. It provides an estimate of body fat based on height and weight. Your health care provider can find your BMI and can help you achieve or maintain a healthy weight.For adults 20 years and older:  A BMI below 18.5 is considered underweight.  A BMI of 18.5 to 24.9 is normal.  A BMI of 25 to 29.9 is considered overweight.  A BMI of 30 and above is considered obese.  Maintain normal blood lipids and cholesterol levels by exercising and minimizing your intake of saturated fat. Eat a balanced diet with plenty of fruit and vegetables. Blood tests for lipids and cholesterol should begin at age 27 and be repeated every 5 years. If your lipid or cholesterol levels are high, you are over 50, or you are at high risk for heart disease, you may need your cholesterol levels checked more frequently.Ongoing high lipid and cholesterol levels should be treated with medicines if diet and exercise are not working.  If you smoke, find out from your health care provider how to quit. If you do not use tobacco, do not start.  Lung cancer screening is recommended for adults aged 43-80 years who are at high risk for developing  lung cancer because of a history of smoking. A yearly low-dose CT scan of the lungs is recommended for people who have at least a 30-pack-year history of smoking and are a current smoker or have quit within the past 15 years. A pack year of smoking is smoking an average of 1 pack of cigarettes a day for 1 year (for example: 1 pack a day for 30 years or 2 packs a day for 15 years). Yearly screening should continue until the smoker has stopped smoking for at least 15  years. Yearly screening should be stopped for people who develop a health problem that would prevent them from having lung cancer treatment.  If you choose to drink alcohol, do not have more than 2 drinks per day. One drink is considered to be 12 ounces (355 mL) of beer, 5 ounces (148 mL) of wine, or 1.5 ounces (44 mL) of liquor.  Avoid use of street drugs. Do not share needles with anyone. Ask for help if you need support or instructions about stopping the use of drugs.  High blood pressure causes heart disease and increases the risk of stroke. Your blood pressure should be checked at least every 1-2 years. Ongoing high blood pressure should be treated with medicines, if weight loss and exercise are not effective.  If you are 17-30 years old, ask your health care provider if you should take aspirin to prevent heart disease.  Diabetes screening is done by taking a blood sample to check your blood glucose level after you have not eaten for a certain period of time (fasting). If you are not overweight and you do not have risk factors for diabetes, you should be screened once every 3 years starting at age 28. If you are overweight or obese and you are 14-75 years of age, you should be screened for diabetes every year as part of your cardiovascular risk assessment.  Colorectal cancer can be detected and often prevented. Most routine colorectal cancer screening begins at the age of 42 and continues through age 8. However, your health care provider may recommend screening at an earlier age if you have risk factors for colon cancer. On a yearly basis, your health care provider may provide home test kits to check for hidden blood in the stool. Use of a small camera at the end of a tube to directly examine the colon (sigmoidoscopy or colonoscopy) can detect the earliest forms of colorectal cancer. Talk to your health care provider about this at age 22, when routine screening begins. Direct exam of the colon  should be repeated every 5-10 years through age 78, unless early forms of precancerous polyps or small growths are found.  People who are at an increased risk for hepatitis B should be screened for this virus. You are considered at high risk for hepatitis B if:  You were born in a country where hepatitis B occurs often. Talk with your health care provider about which countries are considered high risk.  Your parents were born in a high-risk country and you have not received a shot to protect against hepatitis B (hepatitis B vaccine).  You have HIV or AIDS.  You use needles to inject street drugs.  You live with, or have sex with, someone who has hepatitis B.  You are a man who has sex with other men (MSM).  You get hemodialysis treatment.  You take certain medicines for conditions such as cancer, organ transplantation, and autoimmune conditions.  Hepatitis C blood  testing is recommended for all people born from [birth year] through 1965 and any individual with known risks for hepatitis C.  Practice safe sex. Use condoms and avoid high-risk sexual practices to reduce the spread of sexually transmitted infections (STIs). STIs include gonorrhea, chlamydia, syphilis, trichomonas, herpes, HPV, and human immunodeficiency virus (HIV). Herpes, HIV, and HPV are viral illnesses that have no cure. They can result in disability, cancer, and death.  If you are a man who has sex with other men, you should be screened at least once per year for:  HIV.  Urethral, rectal, and pharyngeal infection of gonorrhea, chlamydia, or both.  If you are at risk of being infected with HIV, it is recommended that you take a prescription medicine daily to prevent HIV infection. This is called preexposure prophylaxis (PrEP). You are considered at risk if:  You are a man who has sex with other men (MSM) and have other risk factors.  You are a heterosexual man, are sexually active, and are at increased risk for HIV  infection.  You take drugs by injection.  You are sexually active with a partner who has HIV.  Talk with your health care provider about whether you are at high risk of being infected with HIV. If you choose to begin PrEP, you should first be tested for HIV. You should then be tested every 3 months for as long as you are taking PrEP.  A one-time screening for abdominal aortic aneurysm (AAA) and surgical repair of large AAAs by ultrasound are recommended for men ages 60 to 51 years who are current or former smokers.  Healthy men should no longer receive prostate-specific antigen (PSA) blood tests as part of routine cancer screening. Talk with your health care provider about prostate cancer screening.  Testicular cancer screening is not recommended for adult males who have no symptoms. Screening includes self-exam, a health care provider exam, and other screening tests. Consult with your health care provider about any symptoms you have or any concerns you have about testicular cancer.  Use sunscreen. Apply sunscreen liberally and repeatedly throughout the day. You should seek shade when your shadow is shorter than you. Protect yourself by wearing long sleeves, pants, a wide-brimmed hat, and sunglasses year round, whenever you are outdoors.  Once a month, do a whole-body skin exam, using a mirror to look at the skin on your back. Tell your health care provider about new moles, moles that have irregular borders, moles that are larger than a pencil eraser, or moles that have changed in shape or color.  Stay current with required vaccines (immunizations).  Influenza vaccine. All adults should be immunized every year.  Tetanus, diphtheria, and acellular pertussis (Td, Tdap) vaccine. An adult who has not previously received Tdap or who does not know his vaccine status should receive 1 dose of Tdap. This initial dose should be followed by tetanus and diphtheria toxoids (Td) booster doses every 10 years.  Adults with an unknown or incomplete history of completing a 3-dose immunization series with Td-containing vaccines should begin or complete a primary immunization series including a Tdap dose. Adults should receive a Td booster every 10 years.  Varicella vaccine. An adult without evidence of immunity to varicella should receive 2 doses or a second dose if he has previously received 1 dose.  Human papillomavirus (HPV) vaccine. Males aged 11-21 years who have not received the vaccine previously should receive the 3-dose series. Males aged 22-26 years may be immunized. Immunization is recommended through  the age of 68 years for any male who has sex with males and did not get any or all doses earlier. Immunization is recommended for any person with an immunocompromised condition through the age of 81 years if he did not get any or all doses earlier. During the 3-dose series, the second dose should be obtained 4-8 weeks after the first dose. The third dose should be obtained 24 weeks after the first dose and 16 weeks after the second dose.  Zoster vaccine. One dose is recommended for adults aged 50 years or older unless certain conditions are present.  Measles, mumps, and rubella (MMR) vaccine. Adults born before 65 generally are considered immune to measles and mumps. Adults born in [birth year] or later should have 1 or more doses of MMR vaccine unless there is a contraindication to the vaccine or there is laboratory evidence of immunity to each of the three diseases. A routine second dose of MMR vaccine should be obtained at least 28 days after the first dose for students attending postsecondary schools, health care workers, or international travelers. People who received inactivated measles vaccine or an unknown type of measles vaccine during 1963-1967 should receive 2 doses of MMR vaccine. People who received inactivated mumps vaccine or an unknown type of mumps vaccine before 1979 and are at high risk for mumps  infection should consider immunization with 2 doses of MMR vaccine. Unvaccinated health care workers born before [birth year] who lack laboratory evidence of measles, mumps, or rubella immunity or laboratory confirmation of disease should consider measles and mumps immunization with 2 doses of MMR vaccine or rubella immunization with 1 dose of MMR vaccine.  Pneumococcal 13-valent conjugate (PCV13) vaccine. When indicated, a person who is uncertain of his immunization history and has no record of immunization should receive the PCV13 vaccine. All adults 24 years of age and older should receive this vaccine. An adult aged 28 years or older who has certain medical conditions and has not been previously immunized should receive 1 dose of PCV13 vaccine. This PCV13 should be followed with a dose of pneumococcal polysaccharide (PPSV23) vaccine. Adults who are at high risk for pneumococcal disease should obtain the PPSV23 vaccine at least 8 weeks after the dose of PCV13 vaccine. Adults older than 68 years of age who have normal immune system function should obtain the PPSV23 vaccine dose at least 1 year after the dose of PCV13 vaccine.  Pneumococcal polysaccharide (PPSV23) vaccine. When PCV13 is also indicated, PCV13 should be obtained first. All adults aged 51 years and older should be immunized. An adult younger than age 85 years who has certain medical conditions should be immunized. Any person who resides in a nursing home or long-term care facility should be immunized. An adult smoker should be immunized. People with an immunocompromised condition and certain other conditions should receive both PCV13 and PPSV23 vaccines. People with human immunodeficiency virus (HIV) infection should be immunized as soon as possible after diagnosis. Immunization during chemotherapy or radiation therapy should be avoided. Routine use of PPSV23 vaccine is not recommended for American Indians, [place] Natives, or people younger than 65 years  unless there are medical conditions that require PPSV23 vaccine. When indicated, people who have unknown immunization and have no record of immunization should receive PPSV23 vaccine. One-time revaccination 5 years after the first dose of PPSV23 is recommended for people aged 19-64 years who have chronic kidney failure, nephrotic syndrome, asplenia, or immunocompromised conditions. People who received 1-2 doses of PPSV23 before age  65 years should receive another dose of PPSV23 vaccine at age 60 years or later if at least 5 years have passed since the previous dose. Doses of PPSV23 are not needed for people immunized with PPSV23 at or after age 67 years.  Meningococcal vaccine. Adults with asplenia or persistent complement component deficiencies should receive 2 doses of quadrivalent meningococcal conjugate (MenACWY-D) vaccine. The doses should be obtained at least 2 months apart. Microbiologists working with certain meningococcal bacteria, [place] recruits, people at risk during an outbreak, and people who travel to or live in countries with a high rate of meningitis should be immunized. A first-year college student up through age 83 years who is living in a residence hall should receive a dose if he did not receive a dose on or after his 16th birthday. Adults who have certain high-risk conditions should receive one or more doses of vaccine.  Hepatitis A vaccine. Adults who wish to be protected from this disease, have chronic liver disease, work with hepatitis A-infected animals, work in hepatitis A research labs, or travel to or work in countries with a high rate of hepatitis A should be immunized. Adults who were previously unvaccinated and who anticipate close contact with an international adoptee during the first 60 days after arrival in the [place] from a country with a high rate of hepatitis A should be immunized.  Hepatitis B vaccine. Adults should be immunized if they wish to be protected from  this disease, are under age 57 years and have diabetes, have chronic liver disease, have had more than one sex partner in the past 6 months, may be exposed to blood or other infectious body fluids, are household contacts or sex partners of hepatitis B positive people, are clients or workers in certain care facilities, or travel to or work in countries with a high rate of hepatitis B.  Haemophilus influenzae type b (Hib) vaccine. A previously unvaccinated person with asplenia or sickle cell disease or having a scheduled splenectomy should receive 1 dose of Hib vaccine. Regardless of previous immunization, a recipient of a hematopoietic stem cell transplant should receive a 3-dose series 6-12 months after his successful transplant. Hib vaccine is not recommended for adults with HIV infection. Preventive Service / Frequency Ages 68 to 36  Blood pressure check.** / Every 3-5 years.  Lipid and cholesterol check.** / Every 5 years beginning at age 47.  Hepatitis C blood test.** / For any individual with known risks for hepatitis C.  Skin self-exam. / Monthly.  Influenza vaccine. / Every year.  Tetanus, diphtheria, and acellular pertussis (Tdap, Td) vaccine.** / Consult your health care provider. 1 dose of Td every 10 years.  Varicella vaccine.** / Consult your health care provider.  HPV vaccine. / 3 doses over 6 months, if 30 or younger.  Measles, mumps, rubella (MMR) vaccine.** / You need at least 1 dose of MMR if you were born in 1957 or later. You may also need a second dose.  Pneumococcal 13-valent conjugate (PCV13) vaccine.** / Consult your health care provider.  Pneumococcal polysaccharide (PPSV23) vaccine.** / 1 to 2 doses if you smoke cigarettes or if you have certain conditions.  Meningococcal vaccine.** / 1 dose if you are age 69 to 47 years and a [employer] researcher living in a residence hall, or have one of several medical conditions. You may also need additional booster  doses.  Hepatitis A vaccine.** / Consult your health care provider.  Hepatitis B vaccine.** / Consult your  health care provider.  Haemophilus influenzae type b (Hib) vaccine.** / Consult your health care provider. Ages 25 to 55  Blood pressure check.** / Every year.  Lipid and cholesterol check.** / Every 5 years beginning at age 58.  Lung cancer screening. / Every year if you are aged 9-80 years and have a 30-pack-year history of smoking and currently smoke or have quit within the past 15 years. Yearly screening is stopped once you have quit smoking for at least 15 years or develop a health problem that would prevent you from having lung cancer treatment.  Fecal occult blood test (FOBT) of stool. / Every year beginning at age 48 and continuing until age 64. You may not have to do this test if you get a colonoscopy every 10 years.  Flexible sigmoidoscopy** or colonoscopy.** / Every 5 years for a flexible sigmoidoscopy or every 10 years for a colonoscopy beginning at age 31 and continuing until age 62.  Hepatitis C blood test.** / For all people born from [birth year] through 1965 and any individual with known risks for hepatitis C.  Skin self-exam. / Monthly.  Influenza vaccine. / Every year.  Tetanus, diphtheria, and acellular pertussis (Tdap/Td) vaccine.** / Consult your health care provider. 1 dose of Td every 10 years.  Varicella vaccine.** / Consult your health care provider.  Zoster vaccine.** / 1 dose for adults aged 3 years or older.  Measles, mumps, rubella (MMR) vaccine.** / You need at least 1 dose of MMR if you were born in 1959 or later. You may also need a second dose.  Pneumococcal 13-valent conjugate (PCV13) vaccine.** / Consult your health care provider.  Pneumococcal polysaccharide (PPSV23) vaccine.** / 1 to 2 doses if you smoke cigarettes or if you have certain conditions.  Meningococcal vaccine.** / Consult your health care provider.  Hepatitis A vaccine.** /  Consult your health care provider.  Hepatitis B vaccine.** / Consult your health care provider.  Haemophilus influenzae type b (Hib) vaccine.** / Consult your health care provider. Ages 48 and over  Blood pressure check.** / Every year.  Lipid and cholesterol check.**/ Every 5 years beginning at age 66.  Lung cancer screening. / Every year if you are aged 85-80 years and have a 30-pack-year history of smoking and currently smoke or have quit within the past 15 years. Yearly screening is stopped once you have quit smoking for at least 15 years or develop a health problem that would prevent you from having lung cancer treatment.  Fecal occult blood test (FOBT) of stool. / Every year beginning at age 64 and continuing until age 64. You may not have to do this test if you get a colonoscopy every 10 years.  Flexible sigmoidoscopy** or colonoscopy.** / Every 5 years for a flexible sigmoidoscopy or every 10 years for a colonoscopy beginning at age 34 and continuing until age 5.  Hepatitis C blood test.** / For all people born from [birth year] through 1967 and any individual with known risks for hepatitis C.  Abdominal aortic aneurysm (AAA) screening.** / A one-time screening for ages 8 to 29 years who are current or former smokers.  Skin self-exam. / Monthly.  Influenza vaccine. / Every year.  Tetanus, diphtheria, and acellular pertussis (Tdap/Td) vaccine.** / 1 dose of Td every 10 years.  Varicella vaccine.** / Consult your health care provider.  Zoster vaccine.** / 1 dose for adults aged 28 years or older.  Pneumococcal 13-valent conjugate (PCV13) vaccine.** / 1 dose for all adults aged  19 years and older.  Pneumococcal polysaccharide (PPSV23) vaccine.** / 1 dose for all adults aged 61 years and older.  Meningococcal vaccine.** / Consult your health care provider.  Hepatitis A vaccine.** / Consult your health care provider.  Hepatitis B vaccine.** / Consult your health care  provider.  Haemophilus influenzae type b (Hib) vaccine.** / Consult your health care provider. **Family history and personal history of risk and conditions may change your health care provider's recommendations.   This information is not intended to replace advice given to you by your health care provider. Make sure you discuss any questions you have with your health care provider.   Document Released: 06/30/2001 Document Revised: 05/25/2014 Document Reviewed: 09/29/2010 Elsevier Interactive Patient Education [employer].

## 2017-05-17 NOTE — Progress Notes (Signed)
Subjective:   Delorise Royalsdward Casale is a 66 y.o. male who presents for Medicare Annual/Subsequent preventive examination.   Immunization History  Administered Date(s) Administered  . Influenza, High Dose Seasonal PF 02/21/2016, 05/17/2017  . Influenza-Unspecified 02/27/2010, 03/23/2011, 02/10/2012, 04/12/2013, 05/04/2014  . Pneumococcal Conjugate-13 09/09/2016  . Pneumococcal-Unspecified 05/04/2014  . Tdap 01/03/2009  . Zoster 03/23/2011     Health Maintenance  Topic Date Due  . Colon Cancer Screening  06/30/1998  . Complete foot exam   06/17/2017*  .  Hepatitis C: One time screening is recommended by Center for Disease Control  (CDC) for  adults born from [birth year] through 1967.   08/16/2017*  . Hemoglobin A1C  11/18/2017  . Eye exam for diabetics  02/08/2018  . Tetanus Vaccine  01/04/2019  . Flu Shot  Completed  . Pneumonia vaccines  Completed  *Topic was postponed. The date shown is not the original due date.     Immunization History  Administered Date(s) Administered  . Influenza, High Dose Seasonal PF 02/21/2016, 05/17/2017  . Influenza-Unspecified 02/27/2010, 03/23/2011, 02/10/2012, 04/12/2013, 05/04/2014  . Pneumococcal Conjugate-13 09/09/2016  . Pneumococcal-Unspecified 05/04/2014  . Tdap 01/03/2009  . Zoster 03/23/2011      6CIT Screen 05/17/2017  What Year? 0 points  What month? 0 points  What time? 0 points  Count back from 20 0 points  Months in reverse 0 points  Repeat phrase 0 points  Total Score 0    Fall Risk  05/17/2017 07/02/2016 10/16/2015  Falls in the past year? No No No   [facility] 05/17/2017 07/02/2016 10/16/2015  Decreased Interest 0 0 0  Down, Depressed, Hopeless 0 0 0  PHQ - 2 Score 0 0 0  Altered sleeping 0 - -  Tired, decreased energy 0 - -  Change in appetite 0 - -  Feeling bad or failure about yourself  0 - -  Trouble concentrating 0 - -  Moving slowly or fidgety/restless 0 - -  Suicidal thoughts 0 - -  PHQ-9 Score 0 -  -   Home Exercise  05/17/2017  Current Exercise Habits Home exercise routine  Type of exercise treadmill  Frequency (Times/Week) 2  Exercise limited by: cardiac condition(s)   Activities of Daily Living In your present state of health, do you have difficulty performing the following activities?  1- Driving - no 2- Managing money - no 3- Feeding yourself - no 4- Getting from the bed to the chair - no 5- Climbing a flight of stairs - no 6- Preparing food and eating - no 7- Bathing or showering - no 8- Getting dressed - no 9- Getting to the toilet - no 10- Using the toilet - no 11- Moving around from place to place - no  Patient states that he does feel safe at home       Objective:    Vitals: BP 132/83   Pulse 95   Ht 5\' 10"  (1.778 m)   Wt 209 lb 4.8 oz (94.9 kg)   SpO2 96%   BMI 30.03 kg/m   Body mass index is 30.03 kg/m.  Advanced Directives 07/02/2016 10/16/2015  Does Patient Have a Medical Advance Directive? Yes Yes  Type of Advance Directive - Healthcare Power of [place];Living will  Does patient want to make changes to medical advance directive? - No - Patient declined    Tobacco Social History   Tobacco Use  Smoking Status Current Every Day Smoker  . Packs/day: 1.00  . Types:  Cigarettes  Smokeless Tobacco Never Used     Ready to quit: No Counseling given: Yes   Past Medical History:  Diagnosis Date  . Diabetes mellitus without complication (HCC)   . Essential hypertension 10/16/2015  . Hyperlipidemia   . OA (osteoarthritis) of hip 11/05/2015  . Obese 10/16/2015  . Smoking   . Vitamin D deficiency 11/05/2015   No past surgical history on file. Family History  Problem Relation Age of Onset  . Diabetes Mother   . Congestive Heart Failure Mother   . Cancer Sister        breast  . Cancer Sister        cervical  . Heart attack Brother    Social History   Socioeconomic History  . Marital status: Widowed    Spouse name: None  . Number of  children: None  . Years of education: None  . Highest education level: None  Social Needs  . Financial resource strain: None  . Food insecurity - worry: None  . Food insecurity - inability: None  . Transportation needs - medical: None  . Transportation needs - non-medical: None  Occupational History  . None  Tobacco Use  . Smoking status: Current Every Day Smoker    Packs/day: 1.00    Types: Cigarettes  . Smokeless tobacco: Never Used  Substance and Sexual Activity  . Alcohol use: Yes    Alcohol/week: 1.8 oz    Types: 1 Glasses of wine, 2 Shots of liquor per week  . Drug use: No  . Sexual activity: No  Other Topics Concern  . None  Social History Narrative  . None    Outpatient Encounter Medications as of 05/17/2017  Medication Sig  . aspirin 81 MG tablet Take 81 mg by mouth daily.  Marland Kitchen glucose blood (ONETOUCH VERIO) test strip Use to test blood sugar 2 times daily- Dx code E11.9  . Insulin Glargine (TOUJEO SOLOSTAR) 300 UNIT/ML SOPN Inject 30 Units into the skin daily.  . insulin lispro (HUMALOG) 100 UNIT/ML cartridge Inject 10 Units into the skin daily.  . INVOKAMET [phone] MG TABS take 2 tablets by mouth once daily  . Lancets (ONETOUCH ULTRASOFT) lancets Use to test blood sugar 2 times daily- Dx code E11.9  . lisinopril (PRINIVIL,ZESTRIL) 5 MG tablet take 1 tablet by mouth once daily  . Vitamin D, Ergocalciferol, (DRISDOL) 50000 units CAPS capsule Take 1 capsule (50,000 Units total) by mouth every 7 (seven) days.  . [DISCONTINUED] ALPRAZolam (XANAX) 0.5 MG tablet Take 1 tablet (0.5 mg total) by mouth as needed for anxiety.  . [DISCONTINUED] insulin aspart (NOVOLOG FLEXPEN) 100 UNIT/ML FlexPen Inject 8 to 10 units before main meal (Patient taking differently: Inject 8 to 12 units before main meal)  . [DISCONTINUED] Insulin Pen Needle 32G X 4 MM MISC Use to inject insulin 2 times per day.  . [DISCONTINUED] meloxicam (MOBIC) 15 MG tablet 1/2- 1 tab po daily for pain  .  [DISCONTINUED] simvastatin (ZOCOR) 40 MG tablet Take 1 tablet (40 mg total) by mouth daily. PATIENT MUST HAVE OFFICE VISIT PRIOR TO ANY FURTHER REFILLS   No facility-administered encounter medications on file as of 05/17/2017.     Activities of Daily Living In your present state of health, do you have any difficulty performing the following activities: 05/17/2017  Hearing? N  Vision? N  Comment patient wears glasses  Difficulty concentrating or making decisions? N  Walking or climbing stairs? N  Dressing or bathing? N  Doing  errands, shopping? N  Some recent data might be hidden    Patient Care Team: Thomasene Lotpalski, Lilyahna Sirmon, DO as PCP - General (Family Medicine)   Assessment:   This is a routine wellness examination for Ramon Dredgedward.  Exercise Activities and Dietary recommendations Current Exercise Habits: Home exercise routine, Type of exercise: treadmill, Frequency (Times/Week): 2, Exercise limited by: cardiac condition(s)  Goals    None      Fall Risk Fall Risk  05/17/2017 07/02/2016 10/16/2015  Falls in the past year? No No No   Is the patient's home free of loose throw rugs in walkways, pet beds, electrical cords, etc?   yes      Grab bars in the bathroom? no      Handrails on the stairs?   yes      Adequate lighting?   yes  Depression Screen PHQ 2/9 Scores 05/17/2017 07/02/2016 10/16/2015  PHQ - 2 Score 0 0 0  PHQ- 9 Score 0 - -    Cognitive Function     6CIT Screen 05/17/2017  What Year? 0 points  What month? 0 points  What time? 0 points  Count back from 20 0 points  Months in reverse 0 points  Repeat phrase 0 points  Total Score 0    Immunization History  Administered Date(s) Administered  . Influenza, High Dose Seasonal PF 02/21/2016, 05/17/2017  . Influenza-Unspecified 02/27/2010, 03/23/2011, 02/10/2012, 04/12/2013, 05/04/2014  . Pneumococcal Conjugate-13 09/09/2016  . Pneumococcal-Unspecified 05/04/2014  . Tdap 01/03/2009  . Zoster 03/23/2011     Qualifies for Shingles Vaccine? y  Screening Tests Health Maintenance  Topic Date Due  . COLONOSCOPY  06/30/1998  . FOOT EXAM  06/17/2017 (Originally 03/30/2017)  . Hepatitis C Screening  08/16/2017 (Originally 29-Aug-1950)  . HEMOGLOBIN A1C  11/18/2017  . OPHTHALMOLOGY EXAM  02/08/2018  . TETANUS/TDAP  01/04/2019  . INFLUENZA VACCINE  Completed  . PNA vac Low Risk Adult  Completed   Cancer Screenings: Lung: Low Dose CT Chest recommended if Age 77-80 years, 30 pack-year currently smoking OR have quit w/in 15years. Patient does qualify. Colorectal: UTD  Additional Screenings:  No increased risks, declined by patient Hepatitis B/HIV/Syphillis: Hepatitis C Screening:     Plan:    I have personally reviewed and noted the following in the patient's chart:   . Medical and social history . Use of alcohol, tobacco or illicit drugs  . Current medications and supplements . Functional ability and status . Nutritional status . Physical activity . Advanced directives . List of other physicians . Hospitalizations, surgeries, and ER visits in previous 12 months . Vitals . Screenings to include cognitive, depression, and falls . Referrals and appointments  In addition, I have reviewed and discussed with patient certain preventive protocols, quality metrics, and best practice recommendations. A written personalized care plan for preventive services as well as general preventive health recommendations were provided to patient.     Thomasene Loteborah Seynabou Fults, DO  05/22/2017

## 2017-05-20 ENCOUNTER — Telehealth: Payer: Self-pay | Admitting: Endocrinology

## 2017-05-20 ENCOUNTER — Telehealth: Payer: Self-pay | Admitting: Family Medicine

## 2017-05-20 ENCOUNTER — Other Ambulatory Visit: Payer: Self-pay | Admitting: Family Medicine

## 2017-05-20 DIAGNOSIS — M160 Bilateral primary osteoarthritis of hip: Secondary | ICD-10-CM

## 2017-05-20 MED ORDER — INSULIN PEN NEEDLE 32G X 4 MM MISC: 3 refills | Status: DC

## 2017-05-20 NOTE — Telephone Encounter (Signed)
Pharmacy faxed over paper for pts medication. Pt stated that pharmacy said they have not heard anything back from us.   Pt is calling to see if we received paper.   Please advise ASAP

## 2017-05-20 NOTE — Telephone Encounter (Signed)
Please review medication for refill. MPulliam, CMA/RT(R)

## 2017-05-20 NOTE — Telephone Encounter (Signed)
Patient was seen on Monday and was told prescriptions would be sent in but when he called his pharmacy they are not there. He needs refills on his Xanax, meloxicam, simvastatin. Sent to [facility]

## 2017-05-20 NOTE — Telephone Encounter (Signed)
Refills on medications have been sent into the pharmacy.  Patient notified. MPulliam, CMA/RT(R)

## 2017-05-20 NOTE — Telephone Encounter (Signed)
I contacted the patient and he advised he needed a refill for his pen needles. [facility] [address] [facility] patient's request.

## 2017-05-21 ENCOUNTER — Other Ambulatory Visit (INDEPENDENT_AMBULATORY_CARE_PROVIDER_SITE_OTHER): Payer: PPO

## 2017-05-21 DIAGNOSIS — E1165 Type 2 diabetes mellitus with hyperglycemia: Secondary | ICD-10-CM | POA: Diagnosis not present

## 2017-05-21 DIAGNOSIS — Z794 Long term (current) use of insulin: Secondary | ICD-10-CM

## 2017-05-21 LAB — BASIC METABOLIC PANEL
BUN: 15 mg/dL (ref 6–23)
CALCIUM: 9.1 mg/dL (ref 8.4–10.5)
CO2: 26 mEq/L (ref 19–32)
Chloride: 101 mEq/L (ref 96–112)
Creatinine, Ser: 0.69 mg/dL (ref 0.40–1.50)
GFR: 120.87 mL/min (ref >60.00)
Glucose, Bld: 128 mg/dL — ABNORMAL HIGH (ref 70–99)
Potassium: 4.1 mEq/L (ref 3.5–5.1)
SODIUM: 136 meq/L (ref 135–145)

## 2017-05-21 LAB — HEMOGLOBIN A1C: HEMOGLOBIN A1C: 7.7 % — AB (ref 4.6–6.5)

## 2017-05-25 ENCOUNTER — Telehealth: Payer: Self-pay | Admitting: Endocrinology

## 2017-05-25 ENCOUNTER — Ambulatory Visit (INDEPENDENT_AMBULATORY_CARE_PROVIDER_SITE_OTHER): Payer: PPO | Admitting: Endocrinology

## 2017-05-25 ENCOUNTER — Encounter: Payer: Self-pay | Admitting: Endocrinology

## 2017-05-25 VITALS — BP 100/68 | HR 91 | Ht 70.0 in | Wt 208.0 lb

## 2017-05-25 DIAGNOSIS — E782 Mixed hyperlipidemia: Secondary | ICD-10-CM

## 2017-05-25 DIAGNOSIS — I10 Essential (primary) hypertension: Secondary | ICD-10-CM

## 2017-05-25 DIAGNOSIS — Z794 Long term (current) use of insulin: Secondary | ICD-10-CM

## 2017-05-25 DIAGNOSIS — E1165 Type 2 diabetes mellitus with hyperglycemia: Secondary | ICD-10-CM

## 2017-05-25 NOTE — Progress Notes (Signed)
Patient ID: Jose Munoz, male   DOB: 13-Jan-1951, 67 y.o.   MRN: [id number]           Reason for Appointment:  Follow-up for Type 2 Diabetes  Referring physician: Opalski   History of Present Illness:          Date of diagnosis of type 2 diabetes mellitus: 1998       Background history:   He thinks his diabetes was relatively easier to control and the onset even though he had initial symptoms of thirst and weight loss He had done very well with his diet and lost a significant amount of weight and was approved to control his diabetes without medications for a few years Subsequently he started with oral medications including metformin, Actos and Amaryl He thinks his blood sugars were well controlled for a few years initially but started getting higher progressively since 2016. Because of poor control he was started on basal bolus insulin regimen on his initial consultation, highest A1c was 11.9 in 01/2016  Recent history:    A1c is now 7.7%, previously range 6.8-7.3  INSULIN regimen: TOUJEO 30 units daily, Humalog 10-14 units at supper  Non-insulin hypoglycemic drugs the patient is taking are:Invokamet 150/1000 twice a day  Current management, blood sugar patterns and problems identified:  Not clear why his A1c is higher as he has only been consistently high readings  Currently checking blood sugars about once a day on an average or slightly more  His highest readings are either before breakfast or after his first meal and only occasionally after evening meal  He takes only mealtime insulin at his evening meal and usually takes 10 units despite variability in his diet  Last night he had a biscuit with gravy and blood sugar went up to 177, he did not adjust his dose for high fat intake  Also not clear why FASTING blood sugars are sometimes as high as 179: Also he is checking his blood sugars late morning instead of when he wakes up around 5-6 AM  Last month has not been  consistent with diet  Has gained 3 pounds since his last visit  Currently taking INVOKAMET both tablets in the morning, instructions previously worked for twice a day  He is trying to do a little more walking lately  He has had a couple of episodes where he felt his blood sugars to be low, once with blood sugar 84 before supper Taking Toujeo around suppertime        Side effects from medications have been: None  Compliance with the medical regimen: Fair  Glucose monitoring:  done more than 1 times a day         Glucometer:   One Touch Verio  Blood sugars by download:  Mean values apply above for all meters except median for One Touch  PRE-MEAL Fasting Lunch Dinner Bedtime Overall  Glucose range: 114-179       Mean/median: 132   128   139+/-25    POST-MEAL PC Breakfast PC Lunch PC Dinner  Glucose range:  132-182  106-177   Mean/median:  155      Mean values apply above for all meters except median for One Touch  PRE-MEAL Fasting Lunch Dinner Bedtime Overall  Glucose range: 121-157  94 111, 199  162    Mean/median:     137      Self-care: The diet that the patient has been following is: tries to limit sweets and drinks  with sugar .      Typical meal intake: Breakfast is egg, toast occasionally, sometimes skipped or; Lunch is a sandwich, has various snacks    Dinner 5-7 P.m.            Dietician visit, most recent: 06/2016 CDE visit: 11/17               Exercise:  walks on treadmill, averaging 1-3/7 days a week  Weight history:  Wt Readings from Last 3 Encounters:  05/25/17 208 lb (94.3 kg)  05/17/17 209 lb 4.8 oz (94.9 kg)  02/17/17 205 lb 6.4 oz (93.2 kg)    Glycemic control:   Lab Results  Component Value Date   HGBA1C 7.7 (H) 05/21/2017   HGBA1C 7.0 (H) 02/11/2017   HGBA1C 6.8 (H) 11/13/2016   Lab Results  Component Value Date   MICROALBUR 4.3 (H) 08/11/2016   LDLCALC 51 08/11/2016   CREATININE 0.69 05/21/2017   Lab Results  Component Value  Date   MICRALBCREAT 5.2 08/11/2016       Allergies as of 05/25/2017   No Known Allergies     Medication List        Accurate as of 05/25/17  8:53 PM. Always use your most recent med list.          ALPRAZolam 0.5 MG tablet Commonly known as:  XANAX take 1 tablet by mouth if needed for anxiety   aspirin 81 MG tablet Take 81 mg by mouth daily.   glucose blood test strip Commonly known as:  ONETOUCH VERIO Use to test blood sugar 2 times daily- Dx code E11.9   HUMALOG 100 UNIT/ML cartridge Generic drug:  insulin lispro Inject 10 Units into the skin daily.   Insulin Glargine 300 UNIT/ML Sopn Commonly known as:  TOUJEO SOLOSTAR Inject 30 Units into the skin daily.   Insulin Pen Needle 32G X 4 MM Misc Use to inject insulin 2 times per day.   INVOKAMET [phone] MG Tabs Generic drug:  Canagliflozin-Metformin HCl take 2 tablets by mouth once daily   lisinopril 5 MG tablet Commonly known as:  PRINIVIL,ZESTRIL take 1 tablet by mouth once daily   meloxicam 15 MG tablet Commonly known as:  MOBIC take 1/2 to 1 tablet by mouth once daily for pain   onetouch ultrasoft lancets Use to test blood sugar 2 times daily- Dx code E11.9   simvastatin 40 MG tablet Commonly known as:  ZOCOR take 1 tablet by mouth once daily (MUST HAVE OFFICE VISIT PRIOR TO ANY FURTHER REFILLS)   Vitamin D (Ergocalciferol) 50000 units Caps capsule Commonly known as:  DRISDOL Take 1 capsule (50,000 Units total) by mouth every 7 (seven) days.       Allergies: No Known Allergies  Past Medical History:  Diagnosis Date  . Diabetes mellitus without complication (HCC)   . Essential hypertension 10/16/2015  . Hyperlipidemia   . OA (osteoarthritis) of hip 11/05/2015  . Obese 10/16/2015  . Smoking   . Vitamin D deficiency 11/05/2015    No past surgical history on file.  Family History  Problem Relation Age of Onset  . Diabetes Mother   . Congestive Heart Failure Mother   . Cancer Sister         breast  . Cancer Sister        cervical  . Heart attack Brother     Social History:  reports that he has been smoking cigarettes.  He has been smoking about 1.00 pack  per day. he has never used smokeless tobacco. He reports that he drinks about 1.8 oz of alcohol per week. He reports that he does not use drugs.   Review of Systems   Lipid history: Has good LDL levels without statin drugs However triglycerides and HDL are borderline, needs follow-up   Lab Results  Component Value Date   CHOL 126 08/11/2016   HDL 39.40 08/11/2016   LDLCALC 51 08/11/2016   TRIG 180.0 (H) 08/11/2016   CHOLHDL 3 08/11/2016           Hypertension:  is being Treated with lisinopril 5 mg  The dose was reduced with starting Invokana   BP Readings from Last 3 Encounters:  05/25/17 100/68  05/17/17 132/83  02/17/17 118/76     Has eye exams regularly, reportedly not showing retinopathy but is being watched for glaucoma  Most recent foot exam: 11/17  His hip pain is slightly better now, still takes meloxicam  LABS:  Lab on 05/21/2017  Component Date Value Ref Range Status  . Sodium 05/21/2017 136  135 - 145 mEq/L Final  . Potassium 05/21/2017 4.1  3.5 - 5.1 mEq/L Final  . Chloride 05/21/2017 101  96 - 112 mEq/L Final  . CO2 05/21/2017 26  19 - 32 mEq/L Final  . Glucose, Bld 05/21/2017 128* 70 - 99 mg/dL Final  . BUN 16/10/960401/08/2017 15  6 - 23 mg/dL Final  . Creatinine, Ser 05/21/2017 0.69  0.40 - 1.50 mg/dL Final  . Calcium 54/09/811901/08/2017 9.1  8.4 - 10.5 mg/dL Final  . GFR 14/78/295601/08/2017 120.87  >60.00 mL/min Final  . Hgb A1c MFr Bld 05/21/2017 7.7* 4.6 - 6.5 % Final   Glycemic Control Guidelines for People with Diabetes:Non Diabetic:  <6%Goal of Therapy: <7%Additional Action Suggested:  >8%     Physical Examination:  BP 100/68   Pulse 91   Ht 5\' 10"  (1.778 m)   Wt 208 lb (94.3 kg)   SpO2 96%   BMI 29.84 kg/m    ASSESSMENT:  Diabetes type 2, uncontrolled  See history of present illness  for detailed discussion of current diabetes management, blood sugar patterns and problems identified  His A1c has gone up to 7.7 Appears that most likely this is related to inconsistent diet over the holidays Also may not take extra insulin for larger meals are high fat meals in the evenings Sometimes may have high readings after his late morning breakfast and he is not taking coverage for this Currently also on Invokamet but is taking both tablets daily instead of twice a day even though this is not extended release preparation He is doing well now with using Basal insulin along with once a day Novolog and Invokamet He has gained a little weight Is only recently starting to do a little walking for exercise  HYPERTENSION: Minimal and well controlled without orthostatic symptoms  PLAN:    Discussed when to check his blood sugars including on waking up and if these are consistently high will need to increase Toujeo  He can take Invokamet at breakfast and suppertime which may help overnight readings better along with less tendency to potential hypoglycemia before suppertime  He probably needs to take about 5 units Humalog to cover large breakfast or lunch meal  Also may go up to 12-14 Humalog at suppertime if eating larger meals or with high carbohydrate or fat content  Discussed need to take Humalog right before eating or a few minutes at a time and  discussed duration of action and mealtime coverage  Regular exercise which she can try to do for shorter duration twice a day instead of longer duration once a day which causes hip pain   Patient Instructions  Split Invokamet  Check blood sugars on waking up  2-3/7 days  Also check blood sugars about 2 hours after a meal and do this after different meals by rotation  Recommended blood sugar levels on waking up is 90-130 and about 2 hours after meal is 130-160  Please bring your blood sugar monitor to each visit, thank you        Reather Littler 05/25/2017, 8:53 PM   Note: This office note was prepared with Dragon voice recognition system technology. Any transcriptional errors that result from this process are unintentional.

## 2017-05-25 NOTE — Patient Instructions (Addendum)
Split Invokamet  Check blood sugars on waking up  2-3/7 days  Also check blood sugars about 2 hours after a meal and do this after different meals by rotation  Recommended blood sugar levels on waking up is 90-130 and about 2 hours after meal is 130-160  Please bring your blood sugar monitor to each visit, thank you

## 2017-05-25 NOTE — Telephone Encounter (Signed)
Patient [facility]. PCP's office told patient that the orders must be put into the system before they can do labs.

## 2017-05-26 ENCOUNTER — Ambulatory Visit: Payer: PPO

## 2017-06-04 ENCOUNTER — Ambulatory Visit (INDEPENDENT_AMBULATORY_CARE_PROVIDER_SITE_OTHER): Payer: PPO | Admitting: Family Medicine

## 2017-06-04 VITALS — BP 104/70 | HR 89 | Ht 70.0 in | Wt 211.3 lb

## 2017-06-04 DIAGNOSIS — H6123 Impacted cerumen, bilateral: Secondary | ICD-10-CM | POA: Diagnosis not present

## 2017-06-04 NOTE — Progress Notes (Signed)
Pt here for an acute care OV today   Impression and Recommendations:    1. Hearing loss of both ears due to cerumen impaction   2. Excessive cerumen in both ear canals    1. Cerumen impaction bilateral TMs. Used soft curette to remove cerumen right EAC without incident.    Education and routine counseling performed. Handouts provided  Indication: Cerumen impaction of the ear(s) Medical necessity statement:  On physical examination, cerumen impairs clinically significant portions of the external auditory canal, and tympanic membrane.  Noted obstructive, copious cerumen that cannot be removed without magnification and instrumentations requiring physician skills Consent:  Discussed benefits and risks of procedure and verbal consent obtained Procedure:   Patient was prepped for the procedure.  Utilized an otoscope to assess and take note of the ear canal, the tympanic membrane, and the presence, amount, and placement of the cerumen. Gentle water irrigation and soft plastic curette was utilized to remove cerumen. Post procedure examination:  shows cerumen was removed, without trauma or injury to the ear canal or TM, which remains intact.   Post-Procedural Ear Care Instructions:    Patient tolerated procedure well.  Proper ear care d/c pt.   The patient is made aware that they may experience temporary vertigo, temporary hearing loss, and temporary discomfort.  If these symptom last for more than 24 hours to call the clinic or proceed to the ED/Urgent Care.  Gross side effects, risk and benefits, and alternatives of medications and treatment plan in general discussed with patient.  Patient is aware that all medications have potential side effects and we are unable to predict every side effect or drug-drug interaction that may occur.   Patient will call with any questions prior to using medication if they have concerns.  Expresses verbal understanding and consents to current therapy and treatment  regimen.  No barriers to understanding were identified.  Red flag symptoms and signs discussed in detail.  Patient expressed understanding regarding what to do in case of emergency\urgent symptoms   Please see AVS handed out to patient at the end of our visit for further patient instructions/ counseling done pertaining to today's office visit.   Return if symptoms worsen or fail to improve.     Note: This document was prepared occasionally using Dragon voice recognition software and may include unintentional dictation errors in addition to a scribe.  This document serves as a record of services personally performed by Thomasene Loteborah Justa Hatchell, DO. It was created on her behalf by Peggye FothergillKatherine Galloway, a trained medical scribe. The creation of this record is based on the scribe's personal observations and the provider's statements to them.   I have reviewed the above medical documentation for accuracy and completeness and I concur.  Thomasene LotDeborah Rainelle Sulewski 06/08/17 8:15 AM   --------------------------------------------------------------------------------------------------------------------------------------------------------------------------------------------------------------------------------------------    Subjective:    CC:  Chief Complaint  Patient presents with  . Cerumen Impaction    HPI: Jose Munoz is a 67 y.o. male who [facility] today for issues as discussed below.  Complains that right ear is clogged. Notes that he and his daughter both get this wax build up, but his son never does.  He notes that years go by between his ear wax concerns, so he never really thinks about it otherwise. He does not use anything over the counter to treat this.   Wt Readings from Last 3 Encounters:  06/04/17 211 lb 4.8 oz (95.8 kg)  05/25/17  208 lb (94.3 kg)  05/17/17 209 lb 4.8 oz (94.9 kg)   BP Readings from Last 3 Encounters:  06/04/17 104/70  05/25/17  100/68  05/17/17 132/83   BMI Readings from Last 3 Encounters:  06/04/17 30.32 kg/m  05/25/17 29.84 kg/m  05/17/17 30.03 kg/m     Patient Care Team    Relationship Specialty Notifications Start End  Thomasene Lot, DO PCP - General Family Medicine  10/16/15      Patient Active Problem List   Diagnosis Date Noted  . Hypertriglyceridemia 09/09/2016    Priority: High  . Essential hypertension 10/16/2015    Priority: High  . Diabetes mellitus without complication (HCC) 10/16/2015    Priority: High  . smoking cessation counseling 11/05/2015    Priority: Medium  . Hyperlipidemia 10/16/2015    Priority: Medium  . Smoking 10/16/2015    Priority: Medium  . Overweight (BMI 25.0-29.9) 10/16/2015    Priority: Medium  . OA (osteoarthritis) of hip 11/05/2015    Priority: Low  . Vitamin D deficiency 11/05/2015    Priority: Low    Past Medical history, Surgical history, Family history, Social history, Allergies and Medications have been entered into the medical record, reviewed and changed as needed.    Current Meds  Medication Sig  . ALPRAZolam (XANAX) 0.5 MG tablet take 1 tablet by mouth if needed for anxiety  . aspirin 81 MG tablet Take 81 mg by mouth daily.  Marland Kitchen glucose blood (ONETOUCH VERIO) test strip Use to test blood sugar 2 times daily- Dx code E11.9  . Insulin Glargine (TOUJEO SOLOSTAR) 300 UNIT/ML SOPN Inject 30 Units into the skin daily.  . insulin lispro (HUMALOG) 100 UNIT/ML cartridge Inject 10 Units into the skin daily.  . Insulin Pen Needle 32G X 4 MM MISC Use to inject insulin 2 times per day.  . INVOKAMET [id number] MG TABS take 2 tablets by mouth once daily  . Lancets (ONETOUCH ULTRASOFT) lancets Use to test blood sugar 2 times daily- Dx code E11.9  . lisinopril (PRINIVIL,ZESTRIL) 5 MG tablet take 1 tablet by mouth once daily  . meloxicam (MOBIC) 15 MG tablet take 1/2 to 1 tablet by mouth once daily for pain  . simvastatin (ZOCOR) 40 MG tablet take 1 tablet by  mouth once daily (MUST HAVE OFFICE VISIT PRIOR TO ANY FURTHER REFILLS)  . Vitamin D, Ergocalciferol, (DRISDOL) 50000 units CAPS capsule Take 1 capsule (50,000 Units total) by mouth every 7 (seven) days.    Allergies:  No Known Allergies   Review of Systems: General:   Denies fever, chills, unexplained weight loss.  Optho/Auditory:   Denies visual changes, blurred vision/LOV Respiratory:   Denies wheeze, DOE more than baseline levels.  Cardiovascular:   Denies chest pain, palpitations, new onset peripheral edema  Gastrointestinal:   Denies nausea, vomiting, diarrhea, abd pain.  Genitourinary: Denies dysuria, freq/ urgency, flank pain or discharge from genitals.  Endocrine:     Denies hot or cold intolerance, polyuria, polydipsia. Musculoskeletal:   Denies unexplained myalgias, joint swelling, unexplained arthralgias, gait problems.  Skin:  Denies new onset rash, suspicious lesions Neurological:     Denies dizziness, unexplained weakness, numbness  Psychiatric/Behavioral:   Denies mood changes, suicidal or homicidal ideations, hallucinations    Objective:   Blood pressure 104/70, pulse 89, height 5\' 10"  (1.778 m), weight 211 lb 4.8 oz (95.8 kg), SpO2 98 %. Body mass index is 30.32 kg/m. General:  Well Developed, well nourished, appropriate for stated age.  Neuro:  Alert and oriented,  extra-ocular muscles intact  HEENT:  Normocephalic, atraumatic, neck supple Ears: Cerumen impaction bilateral TMs. Used soft curette to remove cerumen right EAC without incident.  Skin:  no gross rash, warm, pink. Cardiac:  RRR, S1 S2 Respiratory:  ECTA B/L and A/P, Not using accessory muscles, speaking in full sentences- unlabored. Vascular:  Ext warm, no cyanosis apprec.; cap RF less 2 sec. Psych:  No HI/SI, judgement and insight good, Euthymic mood. Full Affect.

## 2017-06-08 ENCOUNTER — Encounter: Payer: Self-pay | Admitting: Family Medicine

## 2017-06-18 ENCOUNTER — Other Ambulatory Visit: Payer: Self-pay | Admitting: Family Medicine

## 2017-07-06 ENCOUNTER — Other Ambulatory Visit: Payer: Self-pay | Admitting: Endocrinology

## 2017-07-07 ENCOUNTER — Other Ambulatory Visit: Payer: Self-pay | Admitting: Endocrinology

## 2017-07-15 ENCOUNTER — Encounter: Payer: Self-pay | Admitting: Family Medicine

## 2017-07-17 ENCOUNTER — Other Ambulatory Visit: Payer: Self-pay | Admitting: Endocrinology

## 2017-08-02 ENCOUNTER — Other Ambulatory Visit: Payer: Self-pay

## 2017-08-02 NOTE — Telephone Encounter (Signed)
Please look up the [place] controlled substances reporting website and see what else he is getting this medicine from or see if we are the only one that are giving him this medicine.

## 2017-08-02 NOTE — Telephone Encounter (Signed)
Pharmacy sent refill request for Alprazolam.  Reviewed the patient's chart. Patient was last seen on 06/04/2017 and medication was last filled on 05/20/2017.  Request sent to Dr. Sharee Holsterpalski to review.  MPulliam, CMA/RT(R)

## 2017-08-03 MED ORDER — ALPRAZOLAM 0.5 MG PO TABS: ORAL_TABLET | ORAL | 0 refills | Status: DC

## 2017-08-03 NOTE — Telephone Encounter (Signed)
Report ran and patient is only getting from our office.  Approved per Dr. Sharee Holsterpalski after reviewed report. RX printed signed and placed up front to pick.  Patient notified. MPulliam, CMA/RT(R)

## 2017-08-16 LAB — HM DIABETES EYE EXAM

## 2017-08-18 DIAGNOSIS — H2513 Age-related nuclear cataract, bilateral: Secondary | ICD-10-CM | POA: Diagnosis not present

## 2017-08-19 ENCOUNTER — Other Ambulatory Visit: Payer: Self-pay | Admitting: Endocrinology

## 2017-08-19 ENCOUNTER — Other Ambulatory Visit (INDEPENDENT_AMBULATORY_CARE_PROVIDER_SITE_OTHER): Payer: PPO

## 2017-08-19 DIAGNOSIS — Z794 Long term (current) use of insulin: Secondary | ICD-10-CM

## 2017-08-19 DIAGNOSIS — E1165 Type 2 diabetes mellitus with hyperglycemia: Secondary | ICD-10-CM | POA: Diagnosis not present

## 2017-08-20 ENCOUNTER — Encounter: Payer: Self-pay | Admitting: Endocrinology

## 2017-08-20 LAB — COMPREHENSIVE METABOLIC PANEL
ALT: 26 IU/L (ref 0–44)
AST: 17 IU/L (ref 0–40)
Albumin/Globulin Ratio: 1.8 (ref 1.2–2.2)
Albumin: 4.9 g/dL — ABNORMAL HIGH (ref 3.6–4.8)
Alkaline Phosphatase: 64 IU/L (ref 39–117)
BILIRUBIN TOTAL: 0.3 mg/dL (ref 0.0–1.2)
BUN / CREAT RATIO: 24 (ref 10–24)
BUN: 19 mg/dL (ref 8–27)
CO2: 21 mmol/L (ref 20–29)
CREATININE: 0.8 mg/dL (ref 0.76–1.27)
Calcium: 9.7 mg/dL (ref 8.6–10.2)
Chloride: 98 mmol/L (ref 96–106)
GFR calc non Af Amer: 91 mL/min/{1.73_m2} (ref >59)
GFR, EST AFRICAN AMERICAN: 105 mL/min/{1.73_m2} (ref >59)
GLOBULIN, TOTAL: 2.8 g/dL (ref 1.5–4.5)
Glucose: 134 mg/dL — ABNORMAL HIGH (ref 65–99)
Potassium: 4.7 mmol/L (ref 3.5–5.2)
Sodium: 136 mmol/L (ref 134–144)
TOTAL PROTEIN: 7.7 g/dL (ref 6.0–8.5)

## 2017-08-20 LAB — LIPID PANEL W/O CHOL/HDL RATIO
Cholesterol, Total: 127 mg/dL (ref 100–199)
HDL: 42 mg/dL (ref >39)
LDL CALC: 63 mg/dL (ref 0–99)
Triglycerides: 112 mg/dL (ref 0–149)
VLDL CHOLESTEROL CAL: 22 mg/dL (ref 5–40)

## 2017-08-20 LAB — HGB A1C W/O EAG: Hgb A1c MFr Bld: 6.8 % — ABNORMAL HIGH (ref 4.8–5.6)

## 2017-08-20 LAB — PROTEIN / CREATININE RATIO, URINE
Creatinine, Urine: 61.7 mg/dL
PROTEIN UR: 15.9 mg/dL
Protein/Creat Ratio: 258 mg/g creat — ABNORMAL HIGH (ref 0–200)

## 2017-08-23 NOTE — Progress Notes (Signed)
Patient ID: Jose Munoz, male   DOB: [birth year], 67 y.o.   MRN: [id number]           Reason for Appointment:  Follow-up for Type 2 Diabetes  Referring physician: Opalski   History of Present Illness:          Date of diagnosis of type 2 diabetes mellitus: 1998       Background history:   He thinks his diabetes was relatively easier to control and the onset even though he had initial symptoms of thirst and weight loss He had done very well with his diet and lost a significant amount of weight and was approved to control his diabetes without medications for a few years Subsequently he started with oral medications including metformin, Actos and Amaryl He thinks his blood sugars were well controlled for a few years initially but started getting higher progressively since 2016. Because of poor control he was started on basal bolus insulin regimen on his initial consultation, highest A1c was 11.9 in 01/2016  Recent history:    A1c is now 6.8%, previously was 7.7%, previously range 6.8-7.3  INSULIN regimen: TOUJEO 30 units daily, Novolog 10-14 units at supper  Non-insulin hypoglycemic drugs the patient is taking are:Invokamet 150/1000 twice a day  Current management, blood sugar patterns and problems identified:  His A1c is better than before and this is done from an outside lab  He has however done much better with his exercise regimen since his last visit and has lost 3 pounds  Although again his FASTING blood sugars are variable daily appear to be on an average higher than before  He takes his TOUJEO in the morning  Currently still not taking any mealtime insulin except at suppertime  He does have sporadic high readings midday and after supper but he thinks this is dependent on the type and quantity of carbohydrates he would be having such as [place]  He is now taking his INVOKAMET separately at breakfast and suppertime instead of together  He did have one episode of  HYPOGLYCEMIC symptoms around midday with blurred vision and sweating relieved by juice but blood sugar at that time was about 140  No other hypoglycemia  He does only occasional POSTPRANDIAL readings but is also not marking them on his monitor  He exercises mostly in the mornings and usually on the treadmill 5 days a week         Side effects from medications have been: None  Compliance with the medical regimen: Fair  Glucose monitoring:  done more than 1 times a day         Glucometer:   One Touch Verio  Blood sugars by download:  Mean values apply above for all meters except median for One Touch  PRE-MEAL Fasting Lunch Dinner Bedtime Overall  Glucose range:  127-163  143, 176  108, 114    Mean/median:  143     141   POST-MEAL PC Breakfast PC Lunch PC Dinner  Glucose range:    127-188  Mean/median:      Previous readings:  Mean values apply above for all meters except median for One Touch  PRE-MEAL Fasting Lunch Dinner Bedtime Overall  Glucose range: 114-179       Mean/median: 132   128   139+/-25    POST-MEAL PC Breakfast PC Lunch PC Dinner  Glucose range:  132-182  106-177   Mean/median:  155        Self-care: The diet that  the patient has been following is: tries to limit sweets and drinks with sugar .      Typical meal intake: Breakfast is egg, toast occasionally, sometimes skipped or; Lunch is a sandwich, has various snacks    Dinner 5-7 P.m.            Dietician visit, most recent: 06/2016 CDE visit: 11/17               Exercise:  walks on treadmill, averaging 5/7 days a week  Weight history:  Wt Readings from Last 3 Encounters:  08/24/17 205 lb (93 kg)  06/04/17 211 lb 4.8 oz (95.8 kg)  05/25/17 208 lb (94.3 kg)    Glycemic control:   Lab Results  Component Value Date   HGBA1C 7.7 (H) 05/21/2017   HGBA1C 7.0 (H) 02/11/2017   HGBA1C 6.8 (H) 11/13/2016   Lab Results  Component Value Date   MICROALBUR 4.3 (H) 08/11/2016   LDLCALC 51  08/11/2016   CREATININE 0.69 05/21/2017   Lab Results  Component Value Date   MICRALBCREAT 5.2 08/11/2016       Allergies as of 08/24/2017   No Known Allergies     Medication List        Accurate as of 08/24/17 10:46 AM. Always use your most recent med list.          ALPRAZolam 0.5 MG tablet Commonly known as:  XANAX take 1 tablet by mouth if needed for anxiety   aspirin 81 MG tablet Take 81 mg by mouth daily.   HUMALOG 100 UNIT/ML cartridge Generic drug:  insulin lispro Inject 10 Units into the skin daily.   Insulin Glargine 300 UNIT/ML Sopn Commonly known as:  TOUJEO SOLOSTAR Inject 30 Units into the skin daily.   Insulin Pen Needle 32G X 4 MM Misc Use to inject insulin 2 times per day.   INVOKAMET [phone] MG Tabs Generic drug:  Canagliflozin-metFORMIN HCl take 2 tablets by mouth once daily   lisinopril 5 MG tablet Commonly known as:  PRINIVIL,ZESTRIL take 1 tablet by mouth once daily   meloxicam 15 MG tablet Commonly known as:  MOBIC take 1/2 to 1 tablet by mouth once daily for pain   onetouch ultrasoft lancets Use to test blood sugar 2 times daily- Dx code E11.9   ONETOUCH VERIO test strip Generic drug:  glucose blood TEST TWICE DAILY   simvastatin 40 MG tablet Commonly known as:  ZOCOR take 1 tablet by mouth once daily   Vitamin D (Ergocalciferol) 50000 units Caps capsule Commonly known as:  DRISDOL Take 1 capsule (50,000 Units total) by mouth every 7 (seven) days.       Allergies: No Known Allergies  Past Medical History:  Diagnosis Date  . Diabetes mellitus without complication (HCC)   . Essential hypertension 10/16/2015  . Hyperlipidemia   . OA (osteoarthritis) of hip 11/05/2015  . Obese 10/16/2015  . Smoking   . Vitamin D deficiency 11/05/2015    History reviewed. No pertinent surgical history.  Family History  Problem Relation Age of Onset  . Diabetes Mother   . Congestive Heart Failure Mother   . Cancer Sister         breast  . Cancer Sister        cervical  . Heart attack Brother     Social History:  reports that he has been smoking cigarettes.  He has been smoking about 1.00 pack per day. He has never used smokeless tobacco. He  reports that he drinks about 1.8 oz of alcohol per week. He reports that he does not use drugs.   Review of Systems   Lipid history: Has good LDL levels without statin drugs Triglycerides 4/19 show improved level of 112 with HDL 42 LDL now 63     Lab Results  Component Value Date   CHOL 126 08/11/2016   HDL 39.40 08/11/2016   LDLCALC 51 08/11/2016   TRIG 180.0 (H) 08/11/2016   CHOLHDL 3 08/11/2016           Hypertension:  is being Treated with lisinopril 5 mg  Also on Invokana Not checking at home   BP Readings from Last 3 Encounters:  08/24/17 126/70  06/04/17 104/70  05/25/17 100/68     Has eye exams regularly, reportedly done on 08/16/17   Most recent foot exam: 11/17    LABS:  Abstract on 08/20/2017  Component Date Value Ref Range Status  . HM Diabetic Eye Exam 08/16/2017 No Retinopathy  No Retinopathy Final    Physical Examination:  BP 126/70 (BP Location: Left Arm, Patient Position: Sitting, Cuff Size: Normal)   Pulse 89   Ht 5\' 10"  (1.778 m)   Wt 205 lb (93 kg)   SpO2 98%   BMI 29.41 kg/m    ASSESSMENT:  Diabetes type 2, on insulin long-term  See history of present illness for detailed discussion of current diabetes management, blood sugar patterns and problems identified  His A1c has improved at 6.8 and not clear why it was higher than the last visit However he does appear to have done much more exercise than before and is losing a little weight This is despite his fasting blood sugars averaging just over 140 Probably may be having less postprandial hyperglycemia and also is splitting his Invokamet to twice a day since his last visit  He is not taking enough readings after meals but usually trying to watch his  diet  HYPERTENSION: On lisinopril 5 mg  His microalbumin is high done last week but this was done right after his exercise in the morning  LIPIDS: Improved  PLAN:    Try taking Toujeo at bedtime to see if fasting readings are better  Increase the dose to 32 units  Also check more readings after meals consistently  Repeat microalbumin on the next visit   Patient Instructions  Toujeo 32 units at bedtime      Reather Littler 08/24/2017, 10:46 AM   Note: This office note was prepared with Dragon voice recognition system technology. Any transcriptional errors that result from this process are unintentional.

## 2017-08-24 ENCOUNTER — Other Ambulatory Visit: Payer: Self-pay

## 2017-08-24 ENCOUNTER — Encounter: Payer: Self-pay | Admitting: Endocrinology

## 2017-08-24 ENCOUNTER — Ambulatory Visit: Payer: PPO | Admitting: Endocrinology

## 2017-08-24 VITALS — BP 126/70 | HR 89 | Ht 70.0 in | Wt 205.0 lb

## 2017-08-24 DIAGNOSIS — Z794 Long term (current) use of insulin: Secondary | ICD-10-CM

## 2017-08-24 DIAGNOSIS — E1165 Type 2 diabetes mellitus with hyperglycemia: Secondary | ICD-10-CM | POA: Diagnosis not present

## 2017-08-24 NOTE — Patient Instructions (Addendum)
Toujeo 32 units at bedtime  Check blood sugars on waking up  3-4/7   Also check blood sugars about 2 hours after a meal and do this after different meals by rotation  Recommended blood sugar levels on waking up is 90-130 and about 2 hours after meal is 130-160  Please bring your blood sugar monitor to each visit, thank you

## 2017-09-03 LAB — HM DIABETES EYE EXAM

## 2017-09-17 ENCOUNTER — Other Ambulatory Visit: Payer: Self-pay | Admitting: Endocrinology

## 2017-09-20 ENCOUNTER — Other Ambulatory Visit: Payer: Self-pay

## 2017-09-20 MED ORDER — LISINOPRIL 5 MG PO TABS: 5.0000 mg | ORAL_TABLET | Freq: Every day | ORAL | 1 refills | Status: DC

## 2017-09-20 NOTE — Telephone Encounter (Signed)
Pharmacy sent refill request for Lisinopril.  Reviewed chart and sent in refill based on office policy. MPulliam, CMA/RT(R)

## 2017-10-25 ENCOUNTER — Ambulatory Visit (INDEPENDENT_AMBULATORY_CARE_PROVIDER_SITE_OTHER): Payer: PPO | Admitting: Family Medicine

## 2017-10-25 ENCOUNTER — Encounter: Payer: Self-pay | Admitting: Family Medicine

## 2017-10-25 VITALS — BP 126/80 | HR 93 | Temp 97.6°F | Ht 70.0 in | Wt 200.0 lb

## 2017-10-25 DIAGNOSIS — R109 Unspecified abdominal pain: Secondary | ICD-10-CM

## 2017-10-25 DIAGNOSIS — R195 Other fecal abnormalities: Secondary | ICD-10-CM

## 2017-10-25 NOTE — Patient Instructions (Addendum)
   Please eat a brat diet which is bananas rice apples toast-very bland diet.  Please avoid all dairy!  No spicy fried or fatty foods until 3 to 4 days after symptoms resolve and bowels are back to normal you can then start slowly adding back to normal foods.    Bland Diet A bland diet consists of foods that do not have a lot of fat or fiber. Foods without fat or fiber are easier for the body to digest. They are also less likely to irritate your mouth, throat, stomach, and other parts of your gastrointestinal tract. A bland diet is sometimes called a BRAT diet. What is my plan? Your health care provider or dietitian may recommend specific changes to your diet to prevent and treat your symptoms, such as:  Eating small meals often.  Cooking food until it is soft enough to chew easily.  Chewing your food well.  Drinking fluids slowly.  Not eating foods that are very spicy, sour, or fatty.  Not eating citrus fruits, such as oranges and grapefruit.  What do I need to know about this diet?  Eat a variety of foods from the bland diet food list.  Do not follow a bland diet longer than you have to.  Ask your health care provider whether you should take vitamins. What foods can I eat? Grains  Hot cereals, such as cream of wheat. Bread, crackers, or tortillas made from refined white flour. Rice. Vegetables Canned or cooked vegetables. Mashed or boiled potatoes. Fruits Bananas. Applesauce. Other types of cooked or canned fruit with the skin and seeds removed, such as canned peaches or pears. Meats and Other Protein Sources Scrambled eggs. Creamy peanut butter or other nut butters. Lean, well-cooked meats, such as chicken or fish. Tofu. Soups or broths. Dairy Low-fat dairy products, such as milk, cottage cheese, or yogurt. Beverages Water. Herbal tea. Apple juice. Sweets and Desserts Pudding. Custard. Fruit gelatin. Ice cream. Fats and Oils Mild salad dressings. Canola or olive  oil. The items listed above may not be a complete list of allowed foods or beverages. Contact your dietitian for more options. What foods are not recommended? Foods and ingredients that are often not recommended include:  Spicy foods, such as hot sauce or salsa.  Fried foods.  Sour foods, such as pickled or fermented foods.  Raw vegetables or fruits, especially citrus or berries.  Caffeinated drinks.  Alcohol.  Strongly flavored seasonings or condiments.  The items listed above may not be a complete list of foods and beverages that are not allowed. Contact your dietitian for more information. This information is not intended to replace advice given to you by your health care provider. Make sure you discuss any questions you have with your health care provider. Document Released: 08/26/2015 Document Revised: 10/10/2015 Document Reviewed: 05/16/2014 Elsevier Interactive Patient Education  2018 [employer]

## 2017-10-25 NOTE — Progress Notes (Signed)
Pt here for an acute care OV today   Impression and Recommendations:    1. Abdominal cramping   2. Frequent loose stools for 5 days     1.  Abdominal cramping/frequent loose stools for 5 days -likely secondary to antibiotic use recently had 2 antibiotics from 2 different dentists.  Will check 4 C. difficile -obtain stool cultures etc. See orders below.  -start OTC probiotics.  -push electrolyte fluids.  -BRAT diet / bland foods only. Avoid all dairy, spicy, fried, or fatty foods.     Education and routine counseling performed. Handouts provided  Gross side effects, risk and benefits, and alternatives of medications and treatment plan in general discussed with patient.  Patient is aware that all medications have potential side effects and we are unable to predict every side effect or drug-drug interaction that may occur.   Patient will call with any questions prior to using medication if they have concerns.  Expresses verbal understanding and consents to current therapy and treatment regimen.  No barriers to understanding were identified.  Red flag symptoms and signs discussed in detail.  Patient expressed understanding regarding what to do in case of emergency\urgent symptoms   Please see AVS handed out to patient at the end of our visit for further patient instructions/ counseling done pertaining to today's office visit.   Return for Follow-up near future for chronic care.     Note: This document was prepared occasionally using Dragon voice recognition software and may include unintentional dictation errors in addition to a scribe.   This document serves as a record of services personally performed by Thomasene Loteborah Aulton Routt, DO. It was created on her behalf by Thelma BargeNick Cochran, a trained medical scribe. The creation of this record is based on the scribe's personal observations and the provider's statements to them.   I have reviewed the above medical documentation for accuracy and  completeness and I concur.  Thomasene LotDeborah Makoa Satz 10/25/17 5:39 PM  --------------------------------------------------------------------------------------------------------------------------------------------------------------------------------------------------------------------------------------------    Subjective:    CC:  Chief Complaint  Patient presents with  . Abdominal Cramping    with loose/soft stools    HPI: Jose Munoz is a 67 y.o. male who [facility] for issues as discussed below.  Abdominal pain He went to the dentist 5 days ago. No abx were given at the dentist this past time. He states he took 2 different abx in the last 3-4 months.   He started having symptoms of waxing/waning abdominal cramping with associated mild diarrhea, bloating/belching and gassiness, mostly soft stools, and having BM's 4 times a day (up from the usual 1 time a day). Food makes his symptoms worse (both abd pain and having a BM). He denies fever, nausea, vomiting, blood in stool, and watery rice stools. Further denies dysuria or any urinary symptoms.    He has a PMHx of giardia in his lifetime. He denies any exotic traveling.   No problems updated.   Wt Readings from Last 3 Encounters:  10/25/17 200 lb (90.7 kg)  08/24/17 205 lb (93 kg)  06/04/17 211 lb 4.8 oz (95.8 kg)   BP Readings from Last 3 Encounters:  10/25/17 126/80  08/24/17 126/70  06/04/17 104/70   BMI Readings from Last 3 Encounters:  10/25/17 28.70 kg/m  08/24/17 29.41 kg/m  06/04/17 30.32 kg/m     Patient Care Team    Relationship Specialty Notifications Start End  Thomasene Lotpalski, Cannon Arreola, DO PCP - General  Family Medicine  10/16/15      Patient Active Problem List   Diagnosis Date Noted  . Hypertriglyceridemia 09/09/2016    Priority: High  . Essential hypertension 10/16/2015    Priority: High  . Diabetes mellitus without complication (HCC) 10/16/2015    Priority:  High  . smoking cessation counseling 11/05/2015    Priority: Medium  . Hyperlipidemia 10/16/2015    Priority: Medium  . Smoking 10/16/2015    Priority: Medium  . Overweight (BMI 25.0-29.9) 10/16/2015    Priority: Medium  . OA (osteoarthritis) of hip 11/05/2015    Priority: Low  . Vitamin D deficiency 11/05/2015    Priority: Low    Past Medical history, Surgical history, Family history, Social history, Allergies and Medications have been entered into the medical record, reviewed and changed as needed.    Current Meds  Medication Sig  . ALPRAZolam (XANAX) 0.5 MG tablet take 1 tablet by mouth if needed for anxiety (Patient taking differently: take 1 tablet by mouth if needed for anxiety)  . aspirin 81 MG tablet Take 81 mg by mouth daily.  . insulin lispro (HUMALOG) 100 UNIT/ML cartridge Inject 10 Units into the skin daily.  . Insulin Pen Needle 32G X 4 MM MISC Use to inject insulin 2 times per day.  . INVOKAMET [phone] MG TABS take 2 tablets by mouth once daily  . Lancets (ONETOUCH ULTRASOFT) lancets Use to test blood sugar 2 times daily- Dx code E11.9  . lisinopril (PRINIVIL,ZESTRIL) 5 MG tablet Take 1 tablet (5 mg total) by mouth daily.  . meloxicam (MOBIC) 15 MG tablet take 1/2 to 1 tablet by mouth once daily for pain (Patient taking differently: take 1/2 to 1 tablet by mouth once daily for pain)  . ONETOUCH VERIO test strip TEST TWICE DAILY  . simvastatin (ZOCOR) 40 MG tablet take 1 tablet by mouth once daily  . TOUJEO SOLOSTAR 300 UNIT/ML SOPN INJECT 30 UNITS INTO THE SKIN EVERY DAY  . Vitamin D, Ergocalciferol, (DRISDOL) 50000 units CAPS capsule Take 1 capsule (50,000 Units total) by mouth every 7 (seven) days.    Allergies:  No Known Allergies   Review of Systems: General:   Denies fever, chills, unexplained weight loss.  Optho/Auditory:   Denies visual changes, blurred vision/LOV Respiratory:   Denies wheeze, DOE more than baseline levels.  Cardiovascular:   Denies  chest pain, palpitations, new onset peripheral edema  Gastrointestinal:   Denies nausea, vomiting Genitourinary: Denies dysuria, freq/ urgency, flank pain or discharge from genitals.  Endocrine:     Denies hot or cold intolerance, polyuria, polydipsia. Musculoskeletal:   Denies unexplained myalgias, joint swelling, unexplained arthralgias, gait problems.  Skin:  Denies new onset rash, suspicious lesions Neurological:     Denies dizziness, unexplained weakness, numbness  Psychiatric/Behavioral:   Denies mood changes, suicidal or homicidal ideations, hallucinations    Objective:   Blood pressure 126/80, pulse 93, temperature 97.6 F (36.4 C), height 5\' 10"  (1.778 m), weight 200 lb (90.7 kg), SpO2 97 %. Body mass index is 28.7 kg/m. General:  Well Developed, well nourished, appropriate for stated age.  Neuro:  Alert and oriented,  extra-ocular muscles intact  HEENT:  Normocephalic, atraumatic, neck supple Skin:  no gross rash, warm, pink. Cardiac:  RRR, S1 S2 Respiratory:  ECTA B/L and A/P, Not using accessory muscles, speaking in full sentences- unlabored. Vascular:  Ext warm, no cyanosis apprec.; cap RF less 2 sec. Psych:  No HI/SI, judgement and insight good, Euthymic mood. Full  Affect.  Abd: Bowel sounds x4 quadrants. No G/R/R, no OM.

## 2017-10-26 NOTE — Addendum Note (Signed)
Addended by: Leda MinPULLIAM, Kaede Clendenen D on: 10/26/2017 04:42 PM   Modules accepted: Orders

## 2017-10-27 DIAGNOSIS — R109 Unspecified abdominal pain: Secondary | ICD-10-CM | POA: Diagnosis not present

## 2017-10-27 DIAGNOSIS — R195 Other fecal abnormalities: Secondary | ICD-10-CM | POA: Diagnosis not present

## 2017-10-27 NOTE — Addendum Note (Signed)
Addended by: Leda MinPULLIAM, [place] Souter D on: 10/27/2017 05:47 PM   Modules accepted: Orders

## 2017-10-28 ENCOUNTER — Other Ambulatory Visit: Payer: Self-pay | Admitting: Endocrinology

## 2017-10-31 LAB — CLOSTRIDIUM DIFFICILE BY PCR: Toxigenic C. Difficile by PCR: NEGATIVE

## 2017-11-01 LAB — STOOL CULTURE: E COLI SHIGA TOXIN ASSAY: NEGATIVE

## 2017-11-02 LAB — OVA AND PARASITE EXAMINATION

## 2017-11-12 ENCOUNTER — Encounter: Payer: Self-pay | Admitting: Family Medicine

## 2017-11-12 ENCOUNTER — Ambulatory Visit (INDEPENDENT_AMBULATORY_CARE_PROVIDER_SITE_OTHER): Payer: PPO | Admitting: Family Medicine

## 2017-11-12 VITALS — BP 128/76 | HR 93 | Ht 70.0 in | Wt 202.0 lb

## 2017-11-12 DIAGNOSIS — E1159 Type 2 diabetes mellitus with other circulatory complications: Secondary | ICD-10-CM | POA: Diagnosis not present

## 2017-11-12 DIAGNOSIS — Z1211 Encounter for screening for malignant neoplasm of colon: Secondary | ICD-10-CM | POA: Diagnosis not present

## 2017-11-12 DIAGNOSIS — I10 Essential (primary) hypertension: Secondary | ICD-10-CM

## 2017-11-12 DIAGNOSIS — E119 Type 2 diabetes mellitus without complications: Secondary | ICD-10-CM

## 2017-11-12 DIAGNOSIS — E663 Overweight: Secondary | ICD-10-CM

## 2017-11-12 DIAGNOSIS — Z136 Encounter for screening for cardiovascular disorders: Secondary | ICD-10-CM

## 2017-11-12 DIAGNOSIS — E782 Mixed hyperlipidemia: Secondary | ICD-10-CM | POA: Diagnosis not present

## 2017-11-12 DIAGNOSIS — F17200 Nicotine dependence, unspecified, uncomplicated: Secondary | ICD-10-CM

## 2017-11-12 DIAGNOSIS — Z122 Encounter for screening for malignant neoplasm of respiratory organs: Secondary | ICD-10-CM | POA: Diagnosis not present

## 2017-11-12 DIAGNOSIS — E781 Pure hyperglyceridemia: Secondary | ICD-10-CM | POA: Diagnosis not present

## 2017-11-12 DIAGNOSIS — E1169 Type 2 diabetes mellitus with other specified complication: Secondary | ICD-10-CM | POA: Diagnosis not present

## 2017-11-12 NOTE — Patient Instructions (Signed)
GOALS :  The goals are for the Hgb A1C to be less than 7.5 & blood pressure to be less than 135/85.    It is recommended that all diabetics are educated on and follow a healthy diabetic diet, exercise for 30 minutes 3-4 times per week (walking, biking, swimming, or machine), monitor blood glucose readings and bring that record with you to be reviewed at your next office visit.     You should be checking fasting blood sugars- especially after you eat poorly or eat really healthy, and also check 2 hour postprandial blood sugars after largest meal of the day.    Write these down and bring in your log at each office visit.    You will need to be seen every 3 months by the provider managing your Diabetes unless told otherwise by that provider.   You will need yearly eye exams from an eye specialist and foot exams to check the nerves of your feet.  Also, your urine should be checked yearly as well to make sure excess protein is not present.   If you are checking your blood pressure at home, please record it and bring it to your next office visit.    Follow the Dietary Approaches to Stop Hypertension (DASH) diet (3 servings of fruit and vegetables daily, whole grains, low sodium, low-fat proteins).  See below.    Lastly, when it comes to your cholesterol, the goal is to have the HDL (good cholesterol) >40, and the LDL (bad cholesterol) <100.   It is recommended that you follow a heart healthy, low saturated and trans-fat diet and exercise for 30 minutes at least 5 times a week.     (( Check out the DASH diet = 1.5 Gram Low Sodium Diet   A 1.5 gram sodium diet restricts the amount of sodium in the diet to no more than 1.5 g or 1500 mg daily.  The American Heart Association recommends Americans over the age of 69 to consume no more than 1500 mg of sodium each day to reduce the risk of developing high blood pressure.  Research also shows that limiting sodium may reduce heart attack and stroke risk.  Many  foods contain sodium for flavor and sometimes as a preservative.  When the amount of sodium in a diet needs to be low, it is important to know what to look for when choosing foods and drinks.  The following includes some information and guidelines to help make it easier for you to adapt to a low sodium diet.    QUICK TIPS  Do not add salt to food.  Avoid convenience items and fast food.  Choose unsalted snack foods.  Buy lower sodium products, often labeled as "lower sodium" or "no salt added."  Check food labels to learn how much sodium is in 1 serving.  When eating at a restaurant, ask that your food be prepared with less salt or none, if possible.    READING FOOD LABELS FOR SODIUM INFORMATION  The nutrition facts label is a good place to find how much sodium is in foods. Look for products with no more than 400 mg of sodium per serving.  Remember that 1.5 g = 1500 mg.  The food label may also list foods as:  Sodium-free: Less than 5 mg in a serving.  Very low sodium: 35 mg or less in a serving.  Low-sodium: 140 mg or less in a serving.  Light in sodium: 50% less sodium in  a serving. For example, if a food that usually has 300 mg of sodium is changed to become light in sodium, it will have 150 mg of sodium.  Reduced sodium: 25% less sodium in a serving. For example, if a food that usually has 400 mg of sodium is changed to reduced sodium, it will have 300 mg of sodium.    CHOOSING FOODS  Grains  Avoid: Salted crackers and snack items. Some cereals, including instant hot cereals. Bread stuffing and biscuit mixes. Seasoned rice or pasta mixes.  Choose: Unsalted snack items. Low-sodium cereals, oats, puffed wheat and rice, shredded wheat. English muffins and bread. Pasta.  Meats  Avoid: Salted, canned, smoked, spiced, pickled meats, including fish and poultry. Bacon, ham, sausage, cold cuts, hot dogs, anchovies.  Choose: Low-sodium canned tuna and salmon. Fresh or frozen meat, poultry, and  fish.  Dairy  Avoid: Processed cheese and spreads. Cottage cheese. Buttermilk and condensed milk. Regular cheese.  Choose: Milk. Low-sodium cottage cheese. Yogurt. Sour cream. Low-sodium cheese.  Fruits and Vegetables  Avoid: Regular canned vegetables. Regular canned tomato sauce and paste. Frozen vegetables in sauces. Olives. Jose Munoz. Relishes. Sauerkraut.  Choose: Low-sodium canned vegetables. Low-sodium tomato sauce and paste. Frozen or fresh vegetables. Fresh and frozen fruit.  Condiments  Avoid: Canned and packaged gravies. Worcestershire sauce. Tartar sauce. Barbecue sauce. Soy sauce. Steak sauce. Ketchup. Onion, garlic, and table salt. Meat flavorings and tenderizers.  Choose: Fresh and dried herbs and spices. Low-sodium varieties of mustard and ketchup. Lemon juice. Tabasco sauce. Horseradish.    SAMPLE 1.5 GRAM SODIUM MEAL PLAN:   Breakfast / Sodium (mg)  1 cup low-fat milk / 143 mg  1 whole-wheat English muffin / 240 mg  1 tbs heart-healthy margarine / 153 mg  1 hard-boiled egg / 139 mg  1 small orange / 0 mg  Lunch / Sodium (mg)  1 cup raw carrots / 76 mg  2 tbs no salt added peanut butter / 5 mg  2 slices whole-wheat bread / 270 mg  1 tbs jelly / 6 mg   cup red grapes / 2 mg  Dinner / Sodium (mg)  1 cup whole-wheat pasta / 2 mg  1 cup low-sodium tomato sauce / 73 mg  3 oz lean ground beef / 57 mg  1 small side salad (1 cup raw spinach leaves,  cup cucumber,  cup yellow bell pepper) with 1 tsp olive oil and 1 tsp red wine vinegar / 25 mg  Snack / Sodium (mg)  1 container low-fat vanilla yogurt / 107 mg  3 graham cracker squares / 127 mg  Nutrient Analysis  Calories: 1745  Protein: 75 g  Carbohydrate: 237 g  Fat: 57 g  Sodium: 1425 mg  Document Released: 05/04/2005 Document Revised: 01/14/2011 Document Reviewed: 08/05/2009  [facility] Patient Information 2012 [place], [place].))

## 2017-11-12 NOTE — Progress Notes (Signed)
Impression and Recommendations:    1. Diabetes mellitus without complication (HCC)   2. Hypertension associated with diabetes (HCC)   3. Mixed diabetic hyperlipidemia associated with type 2 diabetes mellitus (HCC)   4. Hypertriglyceridemia   5. Smoking   6. Overweight (BMI 25.0-29.9)   7. Encounter for screening for malignant neoplasm of respiratory organs   8. Encounter for screening for lung cancer   9. Screening for AAA (abdominal aortic aneurysm)   10. Screening for colon cancer    1. Diabetes Mellitus - Patient's diabetes is controlled at this time.  Pt asymptomatic. - Continue current medications as prescribed.  - Since his diabetes is now under better control, advised patient to discuss follow-up with endocrinology.  Reviewed that if endocrinology agrees, he may request to be seen every six months, or once a year.  In exchange, he will continue to follow up here every 3 months for regular monitoring and blood work.  - Reviewed that goal A1c is 7.5 or less.  - Counseled patient on pathophysiology of disease and discussed various treatment options, which often includes dietary and lifestyle modifications as first line.  Importance of low carb/ketogenic diet discussed with patient in addition to regular exercise.   - Check FBS and 2 hours after the biggest meal of your day.  Keep log and bring in next OV for my review.   Also, if you ever feel poorly, please check your blood pressure and blood sugar, as one or the other could be the cause of your symptoms.  - Being a diabetic, you need yearly eye and foot exams. Make appt.for diabetic eye exam   - Continue current medication(s).     Also, risks and benefits of medications discussed with patient, including alternative treatments.  2. Hypertension - Patient's BP elevated in office today; re-checked. - Continue to monitor blood pressure at home.  - Continue current medications as prescribed.  3.  Hyperlipemia/Hypertriglyceridemia - Controlled at this time.  Pt asymptomatic. - Continue current medications as prescribed.  4. Smoking Cessation - Patient tried Chantix in the past but did not tolerate it.  He experienced depressed thoughts while taking Chantix, which scared him and caused him to quit.  - Reviewed the potential for alternatives to Chantix, like Wellbutrin.  Told pt to think seriously about quitting smoking!  Told pt it is very important for his/her health and well being.    Smoking cessation instruction/counseling given:  counseled patient on the dangers of tobacco use, advised patient to stop smoking, and reviewed strategies to maximize success  Discussed with patient that there are multiple treatments to aid in quitting smoking, however I explained none will work unless pt really want to quit  Told to call 1-800-QUIT-NOW 859-040-3535(1-941-713-2469-669) for free smoking cessation counseling and support, or pt can go online to www.heart.org - the American Heart Association website and search "quit smoking ".   5. Lifestyle & Preventative Health Maintenance - Advised patient to continue working toward exercising to improve health.    - Patient will begin with 15 minutes of activity daily. Recommended that the patient eventually strive for at least 150 minutes of moderate cardiovascular activity per week according to guidelines established by the [facility].   - Healthy dietary habits encouraged, including low-carb, and high amounts of lean protein in diet.   - Patient should also consume adequate amounts of water - half of body weight in oz of water per day.  6. Follow-Up - Last labs were  drawn 08/19/2017.  Advised patient about labs that need to be drawn today and in the near future.  Urine drawn today.  - Reviewed the critical need for a AAA screen and a chest lung cancer screening due to risk associated with patient's ongoing smoking.  - Patient is overdue for his colonoscopy.  Reviewed  the critical need for this.   Education and routine counseling performed. Handouts provided.   Orders Placed This Encounter  Procedures  . US ABDOMINAL AORTA SCREENING AAA  . CT CHEST LUNG CA SCREEN LOW DOSE W/O CM  . TSH  . Hemoglobin A1c  . VITAMIN D 25 Hydroxy (Vit-D Deficiency, Fractures)  . T3, free  . T4, free  . Ambulatory referral to Gastroenterology    No orders of the defined types were placed in this encounter.   Return in about 3 months (around 02/12/2018).   The patient was counseled, risk factors were discussed, anticipatory guidance given.  Gross side effects, risk and benefits, and alternatives of medications discussed with patient.  Patient is aware that all medications have potential side effects and we are unable to predict every side effect or drug-drug interaction that may occur.  Expresses verbal understanding and consents to current therapy plan and treatment regimen.  Please see AVS handed out to patient at the end of our visit for further patient instructions/ counseling done pertaining to today's office visit.    Note: This document was prepared using Dragon voice recognition software and may include unintentional dictation errors.  This document serves as a record of services personally performed by Thomasene Lot, DO. It was created on her behalf by Peggye Fothergill, a trained medical scribe. The creation of this record is based on the scribe's personal observations and the provider's statements to them.   I have reviewed the above medical documentation for accuracy and completeness and I concur.  Thomasene Lot 11/14/17 11:51 PM    Subjective:    Chief Complaint  Patient presents with  . Follow-up    Jose Munoz is a 67 y.o. male who presents to [facility] today for Diabetes Management.    Notes that his wife has been deceased for 7 years in 03/23/2023.  He was listening to a song yesterday and they both  used to sing it to each other; he notes he shed some tears about it.  Started dating at [age] and spent their entire lives together.  "It's a tough thing to lose your soul mate."  Luckily he has children and grandchildren he spends much time with.  Recent Acute Viral Gastroenteritis (Resolved) Patient notes that he finally got over his recent stomach issue, "just in the last few days."  Notes that it took a while.  Says that he ran into a few people with the exact same thing, and now suspects that it was a virus.  He resumed exercising recently after feeling better.  Screenings Patient states that his last colonoscopy was more than 10 years ago (per patient, 13 years ago).  He has not had lung cancer screening or a AAA screen.  Smoking Cessation States "I get some pleasure out of smoking" and indicates that he is not ready to quit at this time.  Patient did quit in the past, for 10 years.  Notes that he gained about 45 pounds when he quit, and he's nervous about this occurring again.  DM HPI: -  He has been working on diet and exercise for diabetes.  Patient states that he continues following up with endocrinology every 3 months.  Pt is currently maintained on the following medications for diabetes:   see med list today Medication compliance - Continues with 30 units of Toujeo every night. 10 units of Humalog with each meal; increases this to 12 when he eats more carbs.  Home glucose readings range: Highest fasting has been 162; yesterday he checked it and it was 92. Notes that he doesn't check it when he's totally fasting; he checks after drinking coffee with cream.   Denies polyuria/polydipsia. Denies hypo/ hyperglycemia symptoms - He denies new onset of: chest pain, exercise intolerance, shortness of breath, dizziness, visual changes, headache, lower extremity swelling or claudication.   Last diabetic eye exam was  Lab Results  Component Value Date   HMDIABEYEEXA No  Retinopathy 09/03/2017    Foot exam- UTD  Last A1C in the office was:  Lab Results  Component Value Date   HGBA1C 6.6 (H) 11/12/2017   HGBA1C 6.8 (H) 08/19/2017   HGBA1C 7.7 (H) 05/21/2017    Lab Results  Component Value Date   MICROALBUR 4.3 (H) 08/11/2016   LDLCALC 63 08/19/2017   CREATININE 0.80 08/19/2017   1. HTN HPI:  -  His blood pressure has been controlled at home.  Patient is regularly checking it at home.   Notes that his BP runs usually with a top number of less than 125, and bottom number at 80 or even in the 70's.  - Patient reports good compliance with blood pressure medications  - Denies medication S-E   - Smoking Status noted   - He denies new onset of: chest pain, exercise intolerance, shortness of breath, dizziness, visual changes, headache, lower extremity swelling or claudication.   Last 3 blood pressure readings in our office are as follows: BP Readings from Last 3 Encounters:  11/12/17 128/76  10/25/17 126/80  08/24/17 126/70    Filed Weights   11/12/17 0850  Weight: 202 lb (91.6 kg)     BMI Readings from Last 3 Encounters:  11/12/17 28.98 kg/m  10/25/17 28.70 kg/m  08/24/17 29.41 kg/m     No problems updated.    Patient Care Team    Relationship Specialty Notifications Start End  Thomasene Lot, DO PCP - General Family Medicine  10/16/15      Patient Active Problem List   Diagnosis Date Noted  . Mixed diabetic hyperlipidemia associated with type 2 diabetes mellitus (HCC) 11/12/2017    Priority: High  . Hypertension associated with diabetes (HCC) 11/12/2017    Priority: High  . Hypertriglyceridemia 09/09/2016    Priority: High  . Essential hypertension 10/16/2015    Priority: High  . Diabetes mellitus without complication (HCC) 10/16/2015    Priority: High  . smoking cessation counseling 11/05/2015    Priority: Medium  . Hyperlipidemia 10/16/2015    Priority: Medium  . Smoking 10/16/2015    Priority: Medium  .  Overweight (BMI 25.0-29.9) 10/16/2015    Priority: Medium  . OA (osteoarthritis) of hip 11/05/2015    Priority: Low  . Vitamin D deficiency 11/05/2015    Priority: Low     Past Medical History:  Diagnosis Date  . Diabetes mellitus without complication (HCC)   . Essential hypertension 10/16/2015  . Hyperlipidemia   . OA (osteoarthritis) of hip 11/05/2015  . Obese 10/16/2015  . Smoking   . Vitamin D deficiency 11/05/2015     No past surgical history on file.   Family History  Problem Relation Age of Onset  . Diabetes Mother   . Congestive Heart Failure Mother   . Cancer Sister        breast  . Cancer Sister        cervical  . Heart attack Brother      Social History   Substance and Sexual Activity  Drug Use No  ,  Social History   Substance and Sexual Activity  Alcohol Use Yes  . Alcohol/week: 1.8 oz  . Types: 1 Glasses of wine, 2 Shots of liquor per week  ,  Social History   Tobacco Use  Smoking Status Current Every Day Smoker  . Packs/day: 1.00  . Types: Cigarettes  Smokeless Tobacco Never Used  ,    Current Outpatient Medications on File Prior to Visit  Medication Sig Dispense Refill  . ALPRAZolam (XANAX) 0.5 MG tablet take 1 tablet by mouth if needed for anxiety (Patient taking differently: take 1 tablet by mouth if needed for anxiety) 30 tablet 0  . aspirin 81 MG tablet Take 81 mg by mouth daily.    . insulin lispro (HUMALOG) 100 UNIT/ML cartridge Inject 10 Units into the skin daily.    . Insulin Pen Needle 32G X 4 MM MISC Use to inject insulin 2 times per day. 150 each 3  . INVOKAMET [phone] MG TABS take 2 tablets by mouth once daily 60 tablet 3  . Lancets (ONETOUCH ULTRASOFT) lancets Use to test blood sugar 2 times daily- Dx code E11.9 100 each 12  . lisinopril (PRINIVIL,ZESTRIL) 5 MG tablet Take 1 tablet (5 mg total) by mouth daily. 90 tablet 1  . meloxicam (MOBIC) 15 MG tablet take 1/2 to 1 tablet by mouth once daily for pain (Patient taking  differently: take 1/2 to 1 tablet by mouth once daily for pain) 90 tablet 1  . NOVOLOG FLEXPEN 100 UNIT/ML FlexPen INJECT 8 TO 10 UNITS SUBCUTANEOUSLY BEFORE MAIN MEAL--- EACH PEN LAST 28 DAYS 12 mL 0  . ONETOUCH VERIO test strip TEST TWICE DAILY 100 each 5  . simvastatin (ZOCOR) 40 MG tablet take 1 tablet by mouth once daily 90 tablet 1  . TOUJEO SOLOSTAR 300 UNIT/ML SOPN INJECT 30 UNITS INTO THE SKIN EVERY DAY 3 pen 1  . Vitamin D, Ergocalciferol, (DRISDOL) 50000 units CAPS capsule Take 1 capsule (50,000 Units total) by mouth every 7 (seven) days. 12 capsule 4   No current facility-administered medications on file prior to visit.      No Known Allergies   Review of Systems:   General:  Denies fever, chills Optho/Auditory:   Denies visual changes, blurred vision Respiratory:   Denies SOB, cough, wheeze, DIB  Cardiovascular:   Denies chest pain, palpitations, painful respirations Gastrointestinal:   Denies nausea, vomiting, diarrhea.  Endocrine:     Denies new hot or cold intolerance Musculoskeletal:  Denies joint swelling, gait issues, or new unexplained myalgias/ arthralgias Skin:  Denies rash, suspicious lesions  Neurological:    Denies dizziness, unexplained weakness, numbness  Psychiatric/Behavioral:   Denies mood changes    Objective:     Blood pressure 128/76, pulse 93, height 5\' 10"  (1.778 m), weight 202 lb (91.6 kg), SpO2 95 %.  Body mass index is 28.98 kg/m.  General: Well Developed, well nourished, and in no acute distress.  HEENT: Normocephalic, atraumatic, pupils equal round reactive to light, neck supple, No carotid bruits, no JVD Skin: Warm and dry, cap RF less 2 sec Cardiac: Regular rate and rhythm, S1,  S2 WNL's, no murmurs rubs or gallops Respiratory: ECTA B/L, Not using accessory muscles, speaking in full sentences. NeuroM-Sk: Ambulates w/o assistance, moves ext * 4 w/o difficulty, sensation grossly intact.  Ext: scant edema b/l lower ext Psych: No HI/SI,  judgement and insight good, Euthymic mood. Full Affect.

## 2017-11-13 LAB — HEMOGLOBIN A1C
Est. average glucose Bld gHb Est-mCnc: 143 mg/dL
HEMOGLOBIN A1C: 6.6 % — AB (ref 4.8–5.6)

## 2017-11-13 LAB — VITAMIN D 25 HYDROXY (VIT D DEFICIENCY, FRACTURES): Vit D, 25-Hydroxy: 45.8 ng/mL (ref 30.0–100.0)

## 2017-11-13 LAB — T3, FREE: T3, Free: 3.2 pg/mL (ref 2.0–4.4)

## 2017-11-13 LAB — T4, FREE: FREE T4: 1.34 ng/dL (ref 0.82–1.77)

## 2017-11-13 LAB — TSH: TSH: 1.5 u[IU]/mL (ref 0.450–4.500)

## 2017-11-14 ENCOUNTER — Other Ambulatory Visit: Payer: Self-pay | Admitting: Endocrinology

## 2017-11-24 ENCOUNTER — Ambulatory Visit: Payer: PPO | Admitting: Endocrinology

## 2017-12-02 ENCOUNTER — Encounter: Payer: Self-pay | Admitting: Endocrinology

## 2017-12-02 ENCOUNTER — Ambulatory Visit (INDEPENDENT_AMBULATORY_CARE_PROVIDER_SITE_OTHER): Payer: PPO | Admitting: Endocrinology

## 2017-12-02 VITALS — BP 118/70 | HR 85 | Ht 70.0 in | Wt 205.2 lb

## 2017-12-02 DIAGNOSIS — Z794 Long term (current) use of insulin: Secondary | ICD-10-CM | POA: Diagnosis not present

## 2017-12-02 DIAGNOSIS — E1165 Type 2 diabetes mellitus with hyperglycemia: Secondary | ICD-10-CM

## 2017-12-02 LAB — COMPREHENSIVE METABOLIC PANEL
ALT: 23 U/L (ref 0–53)
AST: 15 U/L (ref 0–37)
Albumin: 4.5 g/dL (ref 3.5–5.2)
Alkaline Phosphatase: 58 U/L (ref 39–117)
BUN: 16 mg/dL (ref 6–23)
CALCIUM: 9.3 mg/dL (ref 8.4–10.5)
CHLORIDE: 100 meq/L (ref 96–112)
CO2: 27 mEq/L (ref 19–32)
CREATININE: 0.73 mg/dL (ref 0.40–1.50)
GFR: 113.09 mL/min (ref >60.00)
GLUCOSE: 151 mg/dL — AB (ref 70–99)
Potassium: 4 mEq/L (ref 3.5–5.1)
SODIUM: 134 meq/L — AB (ref 135–145)
Total Bilirubin: 0.4 mg/dL (ref 0.2–1.2)
Total Protein: 7.8 g/dL (ref 6.0–8.3)

## 2017-12-02 NOTE — Patient Instructions (Signed)
Toujeo 35 for now  Check blood sugars on waking up    Also check blood sugars about 2 hours after a meal and do this after different meals by rotation  Recommended blood sugar levels on waking up is 90-130 and about 2 hours after meal is 130-160  Please bring your blood sugar monitor to each visit, thank you

## 2017-12-02 NOTE — Progress Notes (Signed)
Patient ID: Jose Munoz, male   DOB: 1950/12/13, 67 y.o.   MRN: [id number]           Reason for Appointment:  Follow-up for Type 2 Diabetes  Referring physician: Opalski   History of Present Illness:          Date of diagnosis of type 2 diabetes mellitus: 1998       Background history:   He thinks his diabetes was relatively easier to control and the onset even though he had initial symptoms of thirst and weight loss He had done very well with his diet and lost a significant amount of weight and was approved to control his diabetes without medications for a few years Subsequently he started with oral medications including metformin, Actos and Amaryl He thinks his blood sugars were well controlled for a few years initially but started getting higher progressively since 2016. Because of poor control he was started on basal bolus insulin regimen on his initial consultation, highest A1c was 11.9 in 01/2016  Recent history:    A1c is now 6.6, previously range 6.8-7.3  INSULIN regimen: TOUJEO 32 units daily at bedtime, Novolog 10-14 units at supper  Non-insulin hypoglycemic drugs the patient is taking are:Invokamet 150/1000 twice a day  Current management, blood sugar patterns and problems identified:  His A1c is better than before  He was tried on Toujeo at bedtime instead of in the morning but he still does not appear to have improved readings in the mornings fasting  However most of his high readings in the mornings have been since 11/20/2017  He does not check his sugars much at all and only occasionally in the afternoons and evenings  For the last month has not done any walking because of a viral illness  Overall has not had any weight gain  He has a few readings after supper which appeared to be fairly good  He again adjust his NovoLog based on amount of carbohydrate he is eating  However he does not take his NovoLog with him when he is eating out a large lunch at  lunchtime which is mostly on Sundays Has a slight increase in blood sugar with drinking coffee in the morning        Side effects from medications have been: None  Compliance with the medical regimen: Fair  Glucose monitoring:  done more than 1 times a day         Glucometer:   One Touch Verio  Blood sugars by download: Mean values apply above for all meters except median for One Touch  PRE-MEAL Fasting Lunch Dinner Bedtime Overall  Glucose range:  127-172      Mean/median:  162    147   POST-MEAL PC Breakfast PC Lunch PC Dinner  Glucose range:  93-162  147, 174  111-160  Mean/median:      PREVIOUS readings    PRE-MEAL Fasting Lunch Dinner Bedtime Overall  Glucose range:  127-163  143, 176  108, 114    Mean/median:  143     14 1   POST-MEAL PC Breakfast PC Lunch PC Dinner  Glucose range:    127-188  Mean/median:         Self-care: The diet that the patient has been following is: tries to limit sweets and drinks with sugar .      Typical meal intake: Breakfast is egg, toast occasionally, sometimes skipped or; Lunch is a sandwich, has various snacks    Dinner 5-7  P.m.            Dietician visit, most recent: 06/2016 CDE visit: 11/17               Exercise:  walks on treadmill, averaging 0-4/7 days a week  Weight history:  Wt Readings from Last 3 Encounters:  12/02/17 205 lb 3.2 oz (93.1 kg)  11/12/17 202 lb (91.6 kg)  10/25/17 200 lb (90.7 kg)    Glycemic control:   Lab Results  Component Value Date   HGBA1C 6.6 (H) 11/12/2017   HGBA1C 6.8 (H) 08/19/2017   HGBA1C 7.7 (H) 05/21/2017   Lab Results  Component Value Date   MICROALBUR 4.3 (H) 08/11/2016   LDLCALC 63 08/19/2017   CREATININE 0.73 12/02/2017   Lab Results  Component Value Date   MICRALBCREAT 5.2 08/11/2016       Allergies as of 12/02/2017   No Known Allergies     Medication List        Accurate as of 12/02/17  2:36 PM. Always use your most recent med list.          ALPRAZolam  0.5 MG tablet Commonly known as:  XANAX take 1 tablet by mouth if needed for anxiety   aspirin 81 MG tablet Take 81 mg by mouth daily.   HUMALOG 100 UNIT/ML cartridge Generic drug:  insulin lispro Inject 10 Units into the skin daily.   Insulin Pen Needle 32G X 4 MM Misc Use to inject insulin 2 times per day.   INVOKAMET [phone] MG Tabs Generic drug:  Canagliflozin-metFORMIN HCl TAKE 2 TABLETS BY MOUTH DAILY   lisinopril 5 MG tablet Commonly known as:  PRINIVIL,ZESTRIL Take 1 tablet (5 mg total) by mouth daily.   meloxicam 15 MG tablet Commonly known as:  MOBIC take 1/2 to 1 tablet by mouth once daily for pain   onetouch ultrasoft lancets Use to test blood sugar 2 times daily- Dx code E11.9   ONETOUCH VERIO test strip Generic drug:  glucose blood TEST TWICE DAILY   simvastatin 40 MG tablet Commonly known as:  ZOCOR take 1 tablet by mouth once daily   TOUJEO SOLOSTAR 300 UNIT/ML Sopn Generic drug:  Insulin Glargine INJECT 30 UNITS INTO THE SKIN EVERY DAY   Vitamin D (Ergocalciferol) 50000 units Caps capsule Commonly known as:  DRISDOL Take 1 capsule (50,000 Units total) by mouth every 7 (seven) days.       Allergies: No Known Allergies  Past Medical History:  Diagnosis Date  . Diabetes mellitus without complication (HCC)   . Essential hypertension 10/16/2015  . Hyperlipidemia   . OA (osteoarthritis) of hip 11/05/2015  . Obese 10/16/2015  . Smoking   . Vitamin D deficiency 11/05/2015    History reviewed. No pertinent surgical history.  Family History  Problem Relation Age of Onset  . Diabetes Mother   . Congestive Heart Failure Mother   . Cancer Sister        breast  . Cancer Sister        cervical  . Heart attack Brother     Social History:  reports that he has been smoking cigarettes.  He has been smoking about 1.00 pack per day. He has never used smokeless tobacco. He reports that he drinks about 1.8 oz of alcohol per week. He reports that he  does not use drugs.   Review of Systems   Lipid history: Has good LDL levels without statin drugs Triglycerides 4/19 show improved level of  112 with HDL 42 and LDL 63   Lab Results  Component Value Date   CHOL 127 08/19/2017   HDL 42 08/19/2017   LDLCALC 63 08/19/2017   TRIG 112 08/19/2017   CHOLHDL 3 08/11/2016           Hypertension:  is being Treated with lisinopril 5 mg  Also on Invokana Not checking at home   BP Readings from Last 3 Encounters:  12/02/17 118/70  11/12/17 128/76  10/25/17 126/80     Has eye exams regularly, reportedly done on 08/16/17   Most recent foot exam: 11/17    LABS:  Office Visit on 12/02/2017  Component Date Value Ref Range Status  . Sodium 12/02/2017 134* 135 - 145 mEq/L Final  . Potassium 12/02/2017 4.0  3.5 - 5.1 mEq/L Final  . Chloride 12/02/2017 100  96 - 112 mEq/L Final  . CO2 12/02/2017 27  19 - 32 mEq/L Final  . Glucose, Bld 12/02/2017 151* 70 - 99 mg/dL Final  . BUN 16/10/960407/18/2019 16  6 - 23 mg/dL Final  . Creatinine, Ser 12/02/2017 0.73  0.40 - 1.50 mg/dL Final  . Total Bilirubin 12/02/2017 0.4  0.2 - 1.2 mg/dL Final  . Alkaline Phosphatase 12/02/2017 58  39 - 117 U/L Final  . AST 12/02/2017 15  0 - 37 U/L Final  . ALT 12/02/2017 23  0 - 53 U/L Final  . Total Protein 12/02/2017 7.8  6.0 - 8.3 g/dL Final  . Albumin 54/09/811907/18/2019 4.5  3.5 - 5.2 g/dL Final  . Calcium 14/78/295607/18/2019 9.3  8.4 - 10.5 mg/dL Final  . GFR 21/30/865707/18/2019 113.09  >60.00 mL/min Final    Physical Examination:  BP 118/70 (BP Location: Left Arm, Patient Position: Sitting, Cuff Size: Normal)   Pulse 85   Ht 5\' 10"  (1.778 m)   Wt 205 lb 3.2 oz (93.1 kg)   SpO2 95%   BMI 29.44 kg/m    ASSESSMENT:  Diabetes type 2, insulin-dependent  See history of present illness for detailed discussion of current diabetes management, blood sugar patterns and problems identified  His A1c has improved at 6  He is on mealtime insulin only at suppertime although appears  to be needing it if he is eating out to large meal at lunchtime More recently has not exercised and this will be causing relatively higher fasting readings also medically  HYPERTENSION: Well controlled  PLAN:    Increase Toujeo up to 35 units at least until blood sugars are consistently below 130  He will need to adjust his suppertime dose based on what he is eating  Recommended that he take his NovoLog with him when eating out at lunchtime  Resume exercise  Check chemistry panel since he is on Invokana  Follow-up in 6 months    Patient Instructions  Toujeo 35 for now  Check blood sugars on waking up    Also check blood sugars about 2 hours after a meal and do this after different meals by rotation  Recommended blood sugar levels on waking up is 90-130 and about 2 hours after meal is 130-160  Please bring your blood sugar monitor to each visit, thank you       Reather LittlerAjay Samirah Scarpati 12/02/2017, 2:36 PM   Note: This office note was prepared with Dragon voice recognition system technology. Any transcriptional errors that result from this process are unintentional.

## 2017-12-13 ENCOUNTER — Other Ambulatory Visit: Payer: Self-pay | Admitting: Endocrinology

## 2017-12-15 ENCOUNTER — Other Ambulatory Visit: Payer: Self-pay

## 2017-12-15 DIAGNOSIS — E781 Pure hyperglyceridemia: Secondary | ICD-10-CM

## 2017-12-15 MED ORDER — SIMVASTATIN 40 MG PO TABS: 40.0000 mg | ORAL_TABLET | Freq: Every day | ORAL | 1 refills | Status: DC

## 2017-12-15 NOTE — Telephone Encounter (Addendum)
Pharmacy sent over a refill request for Simvastatin and  Alprazolam.Sent refill for Simvastatin and forward the Alprazolam sent to Dr. Sharee Holsterpalski for review.  Alprazolam was last filled on 08/03/2017.  LOV 12/02/2017.  Please review and advise. MPulliam, CMA/RT(R)

## 2017-12-15 NOTE — Telephone Encounter (Signed)
[place] need to pull the [place] controlled substance [place] on patient.    Again, per our current policy, if patient has had more than 2 controlled substances prescriptions ( 2- 30 d scripts)  in the past 12 months, he will need to come in to sign a controlled substance contract with us etc, etc as well

## 2017-12-16 ENCOUNTER — Other Ambulatory Visit: Payer: Self-pay | Admitting: Endocrinology

## 2017-12-16 NOTE — Telephone Encounter (Signed)
Patient coming in tomorrow morning to sign the controlled substance contract. MPulliam, CMA/RT(R)

## 2017-12-17 NOTE — Telephone Encounter (Signed)
Controlled substance contracted has been explained to patient and he has signed.  Patient could not wait for printed RX can you please send refill over.  Thanks.  MPulliam, CMA/RT(R)

## 2017-12-20 MED ORDER — ALPRAZOLAM 0.5 MG PO TABS: ORAL_TABLET | ORAL | 0 refills | Status: DC

## 2018-01-11 ENCOUNTER — Other Ambulatory Visit: Payer: Self-pay | Admitting: Endocrinology

## 2018-01-12 ENCOUNTER — Other Ambulatory Visit: Payer: Self-pay | Admitting: Endocrinology

## 2018-01-13 ENCOUNTER — Encounter: Payer: Self-pay | Admitting: Family Medicine

## 2018-01-20 ENCOUNTER — Inpatient Hospital Stay: Admission: RE | Admit: 2018-01-20 | Payer: PPO | Source: Ambulatory Visit

## 2018-01-20 ENCOUNTER — Ambulatory Visit: Payer: PPO

## 2018-02-14 ENCOUNTER — Other Ambulatory Visit: Payer: Self-pay | Admitting: Endocrinology

## 2018-03-03 ENCOUNTER — Other Ambulatory Visit: Payer: PPO

## 2018-03-03 DIAGNOSIS — E1159 Type 2 diabetes mellitus with other circulatory complications: Secondary | ICD-10-CM

## 2018-03-03 DIAGNOSIS — E119 Type 2 diabetes mellitus without complications: Secondary | ICD-10-CM | POA: Diagnosis not present

## 2018-03-03 DIAGNOSIS — E559 Vitamin D deficiency, unspecified: Secondary | ICD-10-CM

## 2018-03-03 DIAGNOSIS — E781 Pure hyperglyceridemia: Secondary | ICD-10-CM | POA: Diagnosis not present

## 2018-03-03 DIAGNOSIS — E782 Mixed hyperlipidemia: Secondary | ICD-10-CM

## 2018-03-03 DIAGNOSIS — I10 Essential (primary) hypertension: Secondary | ICD-10-CM

## 2018-03-04 LAB — CBC WITH DIFFERENTIAL/PLATELET
BASOS ABS: 0.1 10*3/uL (ref 0.0–0.2)
Basos: 1 %
EOS (ABSOLUTE): 0.5 10*3/uL — AB (ref 0.0–0.4)
Eos: 6 %
Hematocrit: 50.8 % (ref 37.5–51.0)
Hemoglobin: 17.3 g/dL (ref 13.0–17.7)
Immature Grans (Abs): 0 10*3/uL (ref 0.0–0.1)
Immature Granulocytes: 0 %
LYMPHS ABS: 2.7 10*3/uL (ref 0.7–3.1)
Lymphs: 33 %
MCH: 30.1 pg (ref 26.6–33.0)
MCHC: 34.1 g/dL (ref 31.5–35.7)
MCV: 88 fL (ref 79–97)
MONOS ABS: 0.6 10*3/uL (ref 0.1–0.9)
Monocytes: 7 %
NEUTROS ABS: 4.3 10*3/uL (ref 1.4–7.0)
Neutrophils: 53 %
PLATELETS: 300 10*3/uL (ref 150–450)
RBC: 5.75 x10E6/uL (ref 4.14–5.80)
RDW: 12.2 % — AB (ref 12.3–15.4)
WBC: 8.1 10*3/uL (ref 3.4–10.8)

## 2018-03-04 LAB — COMPREHENSIVE METABOLIC PANEL
ALK PHOS: 63 IU/L (ref 39–117)
ALT: 24 IU/L (ref 0–44)
AST: 18 IU/L (ref 0–40)
Albumin/Globulin Ratio: 1.6 (ref 1.2–2.2)
Albumin: 4.4 g/dL (ref 3.6–4.8)
BILIRUBIN TOTAL: 0.3 mg/dL (ref 0.0–1.2)
BUN / CREAT RATIO: 27 — AB (ref 10–24)
BUN: 18 mg/dL (ref 8–27)
CHLORIDE: 101 mmol/L (ref 96–106)
CO2: 20 mmol/L (ref 20–29)
Calcium: 9.5 mg/dL (ref 8.6–10.2)
Creatinine, Ser: 0.67 mg/dL — ABNORMAL LOW (ref 0.76–1.27)
GFR calc Af Amer: 113 mL/min/{1.73_m2} (ref >59)
GFR calc non Af Amer: 98 mL/min/{1.73_m2} (ref >59)
GLUCOSE: 159 mg/dL — AB (ref 65–99)
Globulin, Total: 2.8 g/dL (ref 1.5–4.5)
Potassium: 4.3 mmol/L (ref 3.5–5.2)
Sodium: 137 mmol/L (ref 134–144)
Total Protein: 7.2 g/dL (ref 6.0–8.5)

## 2018-03-04 LAB — HEMOGLOBIN A1C
Est. average glucose Bld gHb Est-mCnc: 157 mg/dL
HEMOGLOBIN A1C: 7.1 % — AB (ref 4.8–5.6)

## 2018-03-04 LAB — MICROALBUMIN / CREATININE URINE RATIO
CREATININE, UR: 53.3 mg/dL
Microalb/Creat Ratio: 171.7 mg/g creat — ABNORMAL HIGH (ref 0.0–30.0)
Microalbumin, Urine: 91.5 ug/mL

## 2018-03-10 ENCOUNTER — Ambulatory Visit (INDEPENDENT_AMBULATORY_CARE_PROVIDER_SITE_OTHER): Payer: PPO | Admitting: Family Medicine

## 2018-03-10 ENCOUNTER — Encounter: Payer: Self-pay | Admitting: Family Medicine

## 2018-03-10 ENCOUNTER — Other Ambulatory Visit: Payer: Self-pay | Admitting: Endocrinology

## 2018-03-10 VITALS — BP 135/83 | HR 88 | Ht 70.0 in | Wt 209.5 lb

## 2018-03-10 DIAGNOSIS — I10 Essential (primary) hypertension: Secondary | ICD-10-CM | POA: Diagnosis not present

## 2018-03-10 DIAGNOSIS — Z1211 Encounter for screening for malignant neoplasm of colon: Secondary | ICD-10-CM

## 2018-03-10 DIAGNOSIS — E669 Obesity, unspecified: Secondary | ICD-10-CM | POA: Diagnosis not present

## 2018-03-10 DIAGNOSIS — E1159 Type 2 diabetes mellitus with other circulatory complications: Secondary | ICD-10-CM | POA: Diagnosis not present

## 2018-03-10 DIAGNOSIS — F17200 Nicotine dependence, unspecified, uncomplicated: Secondary | ICD-10-CM | POA: Diagnosis not present

## 2018-03-10 DIAGNOSIS — E781 Pure hyperglyceridemia: Secondary | ICD-10-CM

## 2018-03-10 DIAGNOSIS — Z716 Tobacco abuse counseling: Secondary | ICD-10-CM | POA: Diagnosis not present

## 2018-03-10 DIAGNOSIS — E782 Mixed hyperlipidemia: Secondary | ICD-10-CM | POA: Diagnosis not present

## 2018-03-10 DIAGNOSIS — E559 Vitamin D deficiency, unspecified: Secondary | ICD-10-CM

## 2018-03-10 DIAGNOSIS — E1169 Type 2 diabetes mellitus with other specified complication: Secondary | ICD-10-CM | POA: Diagnosis not present

## 2018-03-10 DIAGNOSIS — E119 Type 2 diabetes mellitus without complications: Secondary | ICD-10-CM | POA: Diagnosis not present

## 2018-03-10 MED ORDER — VITAMIN D (ERGOCALCIFEROL) 1.25 MG (50000 UNIT) PO CAPS: 50000.0000 [IU] | ORAL_CAPSULE | ORAL | 4 refills | Status: DC

## 2018-03-10 MED ORDER — SIMVASTATIN 40 MG PO TABS: 40.0000 mg | ORAL_TABLET | Freq: Every day | ORAL | 1 refills | Status: DC

## 2018-03-10 MED ORDER — LISINOPRIL 5 MG PO TABS: 5.0000 mg | ORAL_TABLET | Freq: Every day | ORAL | 1 refills | Status: DC

## 2018-03-10 MED ORDER — ALPRAZOLAM 0.5 MG PO TABS: ORAL_TABLET | ORAL | 0 refills | Status: DC

## 2018-03-10 NOTE — Patient Instructions (Addendum)
[name], since Dr. Lucianne Muss is treating your diabetes, it is important to get the medicines from that specialist for your diabetes.  Please call his office for refills of those medicines.   Patient will come in in the near future first flu shot and also please repeat his urine microalbumin to creatinine ratio as it took a big jump and I want it recheck it for accuracy.      Coping with Quitting Smoking Quitting smoking is a physical and mental challenge. You will face cravings, withdrawal symptoms, and temptation. Before quitting, work with your health care provider to make a plan that can help you cope. Preparation can help you quit and keep you from giving in. How can I cope with cravings? Cravings usually last for 5-10 minutes. If you get through it, the craving will pass. Consider taking the following actions to help you cope with cravings:  Keep your mouth busy: ? Chew sugar-free gum. ? Suck on hard candies or a straw. ? Brush your teeth.  Keep your hands and body busy: ? Immediately change to a different activity when you feel a craving. ? Squeeze or play with a ball. ? Do an activity or a hobby, like making bead jewelry, practicing needlepoint, or working with wood. ? Mix up your normal routine. ? Take a short exercise break. Go for a quick walk or run up and down stairs. ? Spend time in public places where smoking is not allowed.  Focus on doing something kind or helpful for someone else.  Call a friend or family member to talk during a craving.  Join a support group.  Call a quit line, such as 1-800-QUIT-NOW.  Talk with your health care provider about medicines that might help you cope with cravings and make quitting easier for you.  How can I deal with withdrawal symptoms? Your body may experience negative effects as it tries to get used to not having nicotine in the system. These effects are called withdrawal symptoms. They may include:  Feeling hungrier than  normal.  Trouble concentrating.  Irritability.  Trouble sleeping.  Feeling depressed.  Restlessness and agitation.  Craving a cigarette.  To manage withdrawal symptoms:  Avoid places, people, and activities that trigger your cravings.  Remember why you want to quit.  Get plenty of sleep.  Avoid coffee and other caffeinated drinks. These may worsen some of your symptoms.  How can I handle social situations? Social situations can be difficult when you are quitting smoking, especially in the first few weeks. To manage this, you can:  Avoid parties, bars, and other social situations where people might be smoking.  Avoid alcohol.  Leave right away if you have the urge to smoke.  Explain to your family and friends that you are quitting smoking. Ask for understanding and support.  Plan activities with friends or family where smoking is not an option.  What are some ways I can cope with stress? Wanting to smoke may cause stress, and stress can make you want to smoke. Find ways to manage your stress. Relaxation techniques can help. For example:  Breathe slowly and deeply, in through your nose and out through your mouth.  Listen to soothing, relaxing music.  Talk with a family member or friend about your stress.  Light a candle.  Soak in a bath or take a shower.  Think about a peaceful place.  What are some ways I can prevent weight gain? Be aware that many people gain weight  after they quit smoking. However, not everyone does. To keep from gaining weight, have a plan in place before you quit and stick to the plan after you quit. Your plan should include:  Having healthy snacks. When you have a craving, it may help to: ? Eat plain popcorn, crunchy carrots, celery, or other cut vegetables. ? Chew sugar-free gum.  Changing how you eat: ? Eat small portion sizes at meals. ? Eat 4-6 small meals throughout the day instead of 1-2 large meals a day. ? Be mindful when you  eat. Do not watch television or do other things that might distract you as you eat.  Exercising regularly: ? Make time to exercise each day. If you do not have time for a long workout, do short bouts of exercise for 5-10 minutes several times a day. ? Do some form of strengthening exercise, like weight lifting, and some form of aerobic exercise, like running or swimming.  Drinking plenty of water or other low-calorie or no-calorie drinks. Drink 6-8 glasses of water daily, or as much as instructed by your health care provider.  Summary  Quitting smoking is a physical and mental challenge. You will face cravings, withdrawal symptoms, and temptation to smoke again. Preparation can help you as you go through these challenges.  You can cope with cravings by keeping your mouth busy (such as by chewing gum), keeping your body and hands busy, and making calls to family, friends, or a helpline for people who want to quit smoking.  You can cope with withdrawal symptoms by avoiding places where people smoke, avoiding drinks with caffeine, and getting plenty of rest.  Ask your health care provider about the different ways to prevent weight gain, avoid stress, and handle social situations. This information is not intended to replace advice given to you by your health care provider. Make sure you discuss any questions you have with your health care provider. Document Released: 05/01/2016 Document Revised: 05/01/2016 Document Reviewed: 05/01/2016 Elsevier Interactive Patient Education  [employer].

## 2018-03-10 NOTE — Progress Notes (Signed)
Impression and Recommendations:    1. Diabetes mellitus without complication (HCC)   2. Hypertriglyceridemia   3. Hypertension associated with diabetes (HCC)   4. Mixed diabetic hyperlipidemia associated with type 2 diabetes mellitus (HCC)   5. Smoking   6. smoking cessation counseling   7. Obesity with serious comorbidity, unspecified classification, unspecified obesity type   8. Vitamin D deficiency   9. Screening for colon cancer    1. Reviewed recent lab work (03/03/2018) in depth with patient today.  All lab work within normal limits unless otherwise noted.  Macroproteinuria  - Measured at 171.7, up from one year ago, 5.2.   - Recommended re-check urine micro albumin to creatinine ratio when patient comes in for flu shot, since such a huge jump was seen, and kidneys look good otherwise.  - Advised patient to wean down on caffeine and try to hydrate more adequately. - Recommended that the patient transition to decaf in the evenings, after noontime.  - Advised patient to talk to Dr. Lucianne Muss about this jump in proteinuria. - Will continue to monitor.  HbA1c - Up to 7.1, from 6.6 three months ago. - Reviewed goal as 6.5 given increase in proteinuria. - Discussed for patient to discuss his A1c with Dr. Lucianne Muss.  2. Diabetes Mellitus - Patient's diabetes is controled, with slightly sub-optimal A1c (7.1) this time.   - No changes recommended in treatment plan today.  See med list below. - Patient tolerating meds well without complication.  Denies S-E  - Continue current medications as prescribed.  - Reviewed need to consume less sugar and be more physically active.  - As diabetes remains under better control, advised patient to continue alternating follow-up with endocrinology.  Continue to follow up with Dr. Lucianne Muss every six months.  In exchange, he will continue to follow up here regularly for monitoring and blood work.  - Counseled patient on pathophysiology of disease  and discussed various treatment options, which often includes dietary and lifestyle modifications as first line.  Importance of low carb/ketogenic diet discussed with patient in addition to regular exercise.   - Discussed goal fasting blood sugars with patient today.  A significant portion of the appointment was spent educating the patient in prudent diabetes management.  - Discussed prudent habits to check fasting blood sugar.  Check FBS, as well as postprandial sugars two hours after the biggest meal of your day.  Keep log and bring in next OV for my review.   Also, if you ever feel poorly, please check your blood pressure and blood sugar, as one or the other could be the cause of your symptoms.  - Being a diabetic, you need yearly eye and foot exams. Make appt.for diabetic eye exam  3. Hypertension - Controlled at this time.  Pt asymptomatic. - No changes recommended in treatment plan today.  See med list below. - Patient tolerating meds well without complication.  Denies S-E - Continue current medications as prescribed.  - Will continue to monitor.  4. Hyperlipemia/Hypertriglyceridemia - Last lipid panel was drawn 08/19/2017. - Controlled at this time.  Pt asymptomatic. - No changes recommended in treatment plan today.  See med list below. - Patient tolerating meds well without complication.  Denies S-E - Continue current medications as prescribed.  5. Smoking Cessation - Told pt to think seriously about quitting smoking.  Told pt it is very important for his/her health and well being.    - Smoking cessation instruction/counseling given:  counseled patient on the dangers of tobacco use, advised patient to stop smoking, and reviewed strategies to maximize success.  - Discussed with patient that there are multiple treatments to aid in quitting smoking, however I explained none will work unless pt really want to quit  - Told to call 1-800-QUIT-NOW (680)257-9334) for free smoking  cessation counseling and support, or pt can go online to www.heart.org - the American Heart Association website and search "quit smoking."   6. BMI Counseling Explained to patient what BMI refers to, and what it means medically.    Told patient to think about it as a "medical risk stratification measurement" and how increasing BMI is associated with increasing risk/ or worsening state of various diseases such as hypertension, hyperlipidemia, diabetes, premature OA, depression etc.  American Heart Association guidelines for healthy diet, basically Mediterranean diet, and exercise guidelines of 30 minutes 5 days per week or more discussed in detail.  Health counseling performed.  All questions answered.  7. Lifestyle & Preventative Health Maintenance - Advised patient to continue working toward exercising to improve overall mental, physical, and emotional health.    - Encouraged patient to engage in daily physical activity, especially a formal exercise routine.    - Start by walking 30 minutes per day.  Recommended that the patient eventually strive for at least 150 minutes of moderate cardiovascular activity per week according to guidelines established by the [facility].   - Healthy dietary habits encouraged, including low-carb, and high amounts of lean protein in diet.   - Patient should also consume adequate amounts of water.  Reviewed prudent hydration habits.  8. Follow-Up - Last labs were drawn 03/03/2018.  Reviewed today. - Patient will return for flu shot soon; will obtain urine at that time to assess proteinuria.  - Reviewed the critical need for a AAA screen and a chest lung cancer screening due to risk associated with patient's ongoing smoking.  - Patient is overdue for his colonoscopy.  Reviewed the critical need for this.  - Return every 6 months for chronic follow-up, alternating with Dr. Lucianne Muss of endocrinology for specialty care.   Education and routine counseling performed.  Handouts provided.  Orders Placed This Encounter  Procedures  . Ambulatory referral to Gastroenterology    Medications Discontinued During This Encounter  Medication Reason  . NOVOLOG FLEXPEN 100 UNIT/ML FlexPen Change in therapy  . ALPRAZolam (XANAX) 0.5 MG tablet Reorder  . Vitamin D, Ergocalciferol, (DRISDOL) 50000 units CAPS capsule Reorder  . lisinopril (PRINIVIL,ZESTRIL) 5 MG tablet Reorder  . simvastatin (ZOCOR) 40 MG tablet Reorder      Meds ordered this encounter  Medications  . ALPRAZolam (XANAX) 0.5 MG tablet    Sig: take 1 tablet by mouth if needed for anxiety    Dispense:  30 tablet    Refill:  0  . Vitamin D, Ergocalciferol, (DRISDOL) 50000 units CAPS capsule    Sig: Take 1 capsule (50,000 Units total) by mouth every 7 (seven) days.    Dispense:  12 capsule    Refill:  4  . lisinopril (PRINIVIL,ZESTRIL) 5 MG tablet    Sig: Take 1 tablet (5 mg total) by mouth daily.    Dispense:  90 tablet    Refill:  1  . simvastatin (ZOCOR) 40 MG tablet    Sig: Take 1 tablet (40 mg total) by mouth daily.    Dispense:  90 tablet    Refill:  1    The patient was counseled, risk factors  were discussed, anticipatory guidance given.  Gross side effects, risk and benefits, and alternatives of medications discussed with patient.  Patient is aware that all medications have potential side effects and we are unable to predict every side effect or drug-drug interaction that may occur.  Expresses verbal understanding and consents to current therapy plan and treatment regimen.   Return for 16mo for DM, BP, etc.   Please see AVS handed out to patient at the end of our visit for further patient instructions/ counseling done pertaining to today's office visit.    Note:  This document was prepared using Dragon voice recognition software and may include unintentional dictation errors.   This document serves as a record of services personally performed by Thomasene Lot, DO. It was  created on her behalf by Peggye Fothergill, a trained medical scribe. The creation of this record is based on the scribe's personal observations and the provider's statements to them.   I have reviewed the above medical documentation for accuracy and completeness and I concur.  Thomasene Lot, D.O.      Subjective:    Chief Complaint  Patient presents with  . Follow-up    Jose Munoz is a 67 y.o. male who presents to [facility] today for Diabetes Management.    Caffeine Use Drinks three ten ounce cups of coffee per day.  Drinks caffeinated tea at home, sweetened with Splenda.  Then, in the afternoon around 3 or 4 o'clock, he has another cup of coffee.  Then, with dinner, he has more iced tea.  Patient agrees that he is not as hydrated as he could be.  Cigarette Smoking - Current PPD Smoker Continues smoking daily.  Is smoking a pack per day.  States "I wish I wasn't, but I tried the vaping; I didn't like that at all."  Notes "that was harsh feeling."  DM HPI: -  He has not been working on diet and exercise for diabetes  Pt is currently maintained on the following medications for diabetes:   see med list today Medication compliance - Continues treatment as prescribed.  Home glucose readings range: Checks his fasting blood sugars intermittently, notes that it runs at around 124. Confirms it runs around 120's, 130's; denies lows.  Was told to take his Toujeo at night by Dr. Lucianne Muss, and is tolerating this well.  Denies polyuria/polydipsia. Denies hypo/ hyperglycemia symptoms - He denies new onset of: chest pain, exercise intolerance, shortness of breath, dizziness, visual changes, headache, lower extremity swelling or claudication.   Last diabetic eye exam was  Lab Results  Component Value Date   HMDIABEYEEXA No Retinopathy 09/03/2017    Foot exam- UTD  Last A1C in the office was:  Lab Results  Component Value Date   HGBA1C 7.1 (H)  03/03/2018   HGBA1C 6.6 (H) 11/12/2017   HGBA1C 6.8 (H) 08/19/2017    Lab Results  Component Value Date   MICROALBUR 4.3 (H) 08/11/2016   LDLCALC 63 08/19/2017   CREATININE 0.67 (L) 03/03/2018   1. HTN HPI:  -  His blood pressure has been controlled at home.  Pt is checking it at home.   - Patient reports good compliance with blood pressure medications  - Denies medication S-E   - Smoking Status noted   - He denies new onset of: chest pain, exercise intolerance, shortness of breath, dizziness, visual changes, headache, lower extremity swelling or claudication.   Last 3 blood pressure readings in our office  are as follows: BP Readings from Last 3 Encounters:  03/10/18 135/83  12/02/17 118/70  11/12/17 128/76    Filed Weights   03/10/18 0852  Weight: 209 lb 8 oz (95 kg)     BMI Readings from Last 3 Encounters:  03/10/18 30.06 kg/m  12/02/17 29.44 kg/m  11/12/17 28.98 kg/m     Problem  Obese      Patient Care Team    Relationship Specialty Notifications Start End  Thomasene Lot, DO PCP - General Family Medicine  10/16/15      Patient Active Problem List   Diagnosis Date Noted  . Mixed diabetic hyperlipidemia associated with type 2 diabetes mellitus (HCC) 11/12/2017    Priority: High  . Hypertension associated with diabetes (HCC) 11/12/2017    Priority: High  . Diabetes mellitus without complication (HCC) 10/16/2015    Priority: High  . smoking cessation counseling 11/05/2015    Priority: Medium  . Smoking 10/16/2015    Priority: Medium  . Obese 10/16/2015    Priority: Medium  . OA (osteoarthritis) of hip 11/05/2015    Priority: Low  . Vitamin D deficiency 11/05/2015    Priority: Low     Past Medical History:  Diagnosis Date  . Diabetes mellitus without complication (HCC)   . Essential hypertension 10/16/2015  . Hyperlipidemia   . OA (osteoarthritis) of hip 11/05/2015  . Obese 10/16/2015  . Smoking   . Vitamin D deficiency 11/05/2015      History reviewed. No pertinent surgical history.   Family History  Problem Relation Age of Onset  . Diabetes Mother   . Congestive Heart Failure Mother   . Cancer Sister        breast  . Cancer Sister        cervical  . Heart attack Brother      Social History   Substance and Sexual Activity  Drug Use No  ,  Social History   Substance and Sexual Activity  Alcohol Use Yes  . Alcohol/week: 3.0 standard drinks  . Types: 1 Glasses of wine, 2 Shots of liquor per week  ,  Social History   Tobacco Use  Smoking Status Current Every Day Smoker  . Packs/day: 1.00  . Types: Cigarettes  Smokeless Tobacco Never Used  ,    Current Outpatient Medications on File Prior to Visit  Medication Sig Dispense Refill  . aspirin 81 MG tablet Take 81 mg by mouth daily.    . insulin lispro (HUMALOG) 100 UNIT/ML cartridge Inject 10 Units into the skin daily.    . INVOKAMET [id number] MG TABS TAKE 2 TABLETS BY MOUTH DAILY 60 tablet 0  . Lancets (ONETOUCH ULTRASOFT) lancets Use to test blood sugar 2 times daily- Dx code E11.9 100 each 12  . meloxicam (MOBIC) 15 MG tablet take 1/2 to 1 tablet by mouth once daily for pain (Patient taking differently: take 1/2 to 1 tablet by mouth once daily for pain) 90 tablet 1  . ONETOUCH VERIO test strip TEST TWICE DAILY 100 each 5  . TOUJEO SOLOSTAR 300 UNIT/ML SOPN INJECT 30 UNITS SUBCUTANEOUS EVERY DAY 2 pen 3   No current facility-administered medications on file prior to visit.      No Known Allergies   Review of Systems:   General:  Denies fever, chills Optho/Auditory:   Denies visual changes, blurred vision Respiratory:   Denies SOB, cough, wheeze, DIB  Cardiovascular:   Denies chest pain, palpitations, painful respirations Gastrointestinal:   Denies nausea,  vomiting, diarrhea.  Endocrine:     Denies new hot or cold intolerance Musculoskeletal:  Denies joint swelling, gait issues, or new unexplained myalgias/ arthralgias Skin:  Denies  rash, suspicious lesions  Neurological:    Denies dizziness, unexplained weakness, numbness  Psychiatric/Behavioral:   Denies mood changes    Objective:     Blood pressure 135/83, pulse 88, height 5\' 10"  (1.778 m), weight 209 lb 8 oz (95 kg), SpO2 98 %.  Body mass index is 30.06 kg/m.  General: Well Developed, well nourished, and in no acute distress.  HEENT: Normocephalic, atraumatic, pupils equal round reactive to light, neck supple, No carotid bruits, no JVD Skin: Warm and dry, cap RF less 2 sec Cardiac: Regular rate and rhythm, S1, S2 WNL's, no murmurs rubs or gallops Respiratory: ECTA B/L, Not using accessory muscles, speaking in full sentences. NeuroM-Sk: Ambulates w/o assistance, moves ext * 4 w/o difficulty, sensation grossly intact.  Ext: scant edema b/l lower ext Psych: No HI/SI, judgement and insight good, Euthymic mood. Full Affect.

## 2018-03-16 ENCOUNTER — Other Ambulatory Visit: Payer: Self-pay | Admitting: Endocrinology

## 2018-04-05 DIAGNOSIS — H40053 Ocular hypertension, bilateral: Secondary | ICD-10-CM | POA: Diagnosis not present

## 2018-04-05 LAB — HM DIABETES EYE EXAM

## 2018-04-08 ENCOUNTER — Encounter: Payer: Self-pay | Admitting: *Deleted

## 2018-04-11 ENCOUNTER — Other Ambulatory Visit: Payer: Self-pay | Admitting: Endocrinology

## 2018-04-12 ENCOUNTER — Other Ambulatory Visit: Payer: Self-pay | Admitting: Endocrinology

## 2018-04-12 ENCOUNTER — Ambulatory Visit (INDEPENDENT_AMBULATORY_CARE_PROVIDER_SITE_OTHER): Payer: PPO

## 2018-04-12 ENCOUNTER — Encounter: Payer: Self-pay | Admitting: Family Medicine

## 2018-04-12 DIAGNOSIS — Z23 Encounter for immunization: Secondary | ICD-10-CM

## 2018-04-12 NOTE — Progress Notes (Signed)
Pt here for influenza vaccine.  Screening questionnaire reviewed, VIS provided to patient, and any/all patient questions answered.  T. Apolo Cutshaw, CMA  

## 2018-04-22 ENCOUNTER — Other Ambulatory Visit: Payer: Self-pay | Admitting: Endocrinology

## 2018-05-31 ENCOUNTER — Other Ambulatory Visit (INDEPENDENT_AMBULATORY_CARE_PROVIDER_SITE_OTHER): Payer: PPO

## 2018-05-31 DIAGNOSIS — E119 Type 2 diabetes mellitus without complications: Secondary | ICD-10-CM | POA: Diagnosis not present

## 2018-06-01 LAB — HEMOGLOBIN A1C
ESTIMATED AVERAGE GLUCOSE: 169 mg/dL
Hgb A1c MFr Bld: 7.5 % — ABNORMAL HIGH (ref 4.8–5.6)

## 2018-06-06 ENCOUNTER — Other Ambulatory Visit: Payer: Self-pay | Admitting: Endocrinology

## 2018-06-06 NOTE — Progress Notes (Signed)
Patient ID: Jose Munoz, male   DOB: January 22, 1951, 68 y.o.   MRN: [id number]           Reason for Appointment:  Follow-up for Type 2 Diabetes  Referring physician: Opalski   History of Present Illness:          Date of diagnosis of type 2 diabetes mellitus: 1998       Background history:   He thinks his diabetes was relatively easier to control and the onset even though he had initial symptoms of thirst and weight loss He had done very well with his diet and lost a significant amount of weight and was approved to control his diabetes without medications for a few years Subsequently he started with oral medications including metformin, Actos and Amaryl He thinks his blood sugars were well controlled for a few years initially but started getting higher progressively since 2016. Because of poor control he was started on basal bolus insulin regimen on his initial consultation, highest A1c was 11.9 in 01/2016  Recent history:    A1c is progressively higher at 7.5, on his last visit was 6.6, previously highest 7.3  INSULIN regimen: TOUJEO 35 units daily at bedtime, Novolog 10-14 units at supper  Non-insulin hypoglycemic drugs the patient is taking are:Invokamet 150/1000 twice a day  Current management, blood sugar patterns and problems identified:  His A1c is significantly higher and he is coming back after 6 months  He was told to increase his Toujeo insulin  However his morning sugars are still averaging in the 150-160 range  He is checking blood sugars very infrequently and only 9 readings in the last month  He says that sometimes he will have a big lunch but does not take any insulin for this despite reminders but also will not take his NovoLog with him when he is going out to eat  He did not understand that he can take NovoLog more than once in a day Has a couple of readings after dinner and they seem to be fairly good, usually taking 10 units NovoLog and 14 only if eating a  large meal or pasta No hypoglycemic symptoms       Side effects from medications have been: None  Compliance with the medical regimen: Fair  Glucose monitoring:  done less than 1 times a day         Glucometer:   One Touch Verio  Blood sugars by download:   PRE-MEAL Fasting Lunch Dinner Bedtime Overall  Glucose range:  151-164  131-180  150    Mean/median:  159     151   POST-MEAL PC Breakfast PC Lunch PC Dinner  Glucose range:    133-152  Mean/median:      Previous readings:  PRE-MEAL Fasting Lunch Dinner Bedtime Overall  Glucose range:  127-172      Mean/median:  162    147   POST-MEAL PC Breakfast PC Lunch PC Dinner  Glucose range:  93-162  147, 174  111-160  Mean/median:       Self-care: The diet that the patient has been following is: tries to limit sweets and drinks with sugar .      Typical meal intake: Breakfast is egg, toast occasionally, sometimes skipped or; Lunch is a sandwich, has various snacks    Dinner 5-7 P.m.            Dietician visit, most recent: 06/2016 CDE visit: 11/17  Exercise:  walks on treadmill, averaging 0-4/7 days a week  Weight history:  Wt Readings from Last 3 Encounters:  06/07/18 211 lb 3.2 oz (95.8 kg)  03/10/18 209 lb 8 oz (95 kg)  12/02/17 205 lb 3.2 oz (93.1 kg)    Glycemic control:   Lab Results  Component Value Date   HGBA1C 7.5 (H) 05/31/2018   HGBA1C 7.1 (H) 03/03/2018   HGBA1C 6.6 (H) 11/12/2017   Lab Results  Component Value Date   MICROALBUR 4.3 (H) 08/11/2016   LDLCALC 63 08/19/2017   CREATININE 0.67 (L) 03/03/2018   Lab Results  Component Value Date   MICRALBCREAT 5.2 08/11/2016       Allergies as of 06/07/2018   No Known Allergies     Medication List       Accurate as of June 07, 2018  8:15 AM. Always use your most recent med list.        ALPRAZolam 0.5 MG tablet Commonly known as:  XANAX take 1 tablet by mouth if needed for anxiety   aspirin 81 MG tablet Take 81 mg by  mouth daily.   Insulin Pen Needle 32G X 4 MM Misc Commonly known as:  BD PEN NEEDLE NANO U/F USE TWICE DAILY   INVOKAMET [phone] MG Tabs Generic drug:  Canagliflozin-metFORMIN HCl TAKE 2 TABLETS BY MOUTH DAILY   lisinopril 5 MG tablet Commonly known as:  PRINIVIL,ZESTRIL Take 1 tablet (5 mg total) by mouth daily.   meloxicam 15 MG tablet Commonly known as:  MOBIC take 1/2 to 1 tablet by mouth once daily for pain   NOVOLOG FLEXPEN 100 UNIT/ML FlexPen Generic drug:  insulin aspart INJECT 8-10 UNITS SUBCUTANEOUS BEFORE MAIN MEAL   onetouch ultrasoft lancets Use to test blood sugar 2 times daily- Dx code E11.9   ONETOUCH VERIO test strip Generic drug:  glucose blood TEST TWICE DAILY   simvastatin 40 MG tablet Commonly known as:  ZOCOR Take 1 tablet (40 mg total) by mouth daily.   TOUJEO SOLOSTAR 300 UNIT/ML Sopn Generic drug:  Insulin Glargine (1 Unit Dial) INJECT 30 UNITS SUBCUTANEOUS ONCE A DAY   Vitamin D (Ergocalciferol) 1.25 MG (50000 UT) Caps capsule Commonly known as:  DRISDOL Take 1 capsule (50,000 Units total) by mouth every 7 (seven) days.       Allergies: No Known Allergies  Past Medical History:  Diagnosis Date  . Diabetes mellitus without complication (HCC)   . Essential hypertension 10/16/2015  . Hyperlipidemia   . OA (osteoarthritis) of hip 11/05/2015  . Obese 10/16/2015  . Smoking   . Vitamin D deficiency 11/05/2015    History reviewed. No pertinent surgical history.  Family History  Problem Relation Age of Onset  . Diabetes Mother   . Congestive Heart Failure Mother   . Cancer Sister        breast  . Cancer Sister        cervical  . Heart attack Brother     Social History:  reports that he has been smoking cigarettes. He has been smoking about 1.00 pack per day. He has never used smokeless tobacco. He reports current alcohol use of about 3.0 standard drinks of alcohol per week. He reports that he does not use drugs.   Review of  Systems   Lipid history: Has good LDL levels without statin drugs Triglycerides 4/19 show improved level of 112 with HDL 42 and LDL 63   Lab Results  Component Value Date   CHOL  127 08/19/2017   HDL 42 08/19/2017   LDLCALC 63 08/19/2017   TRIG 112 08/19/2017   CHOLHDL 3 08/11/2016           Hypertension:  is being Treated with lisinopril 5 mg  Also on Invokana    BP Readings from Last 3 Encounters:  06/07/18 130/68  03/10/18 135/83  12/02/17 118/70     Has eye exams regularly, last done on 04/05/2018   Most recent foot exam: 05/2018    LABS:  No visits with results within 1 Week(s) from this visit.  Latest known visit with results is:  Lab on 05/31/2018  Component Date Value Ref Range Status  . Hgb A1c MFr Bld 05/31/2018 7.5* 4.8 - 5.6 % Final   Comment:          Prediabetes: 5.7 - 6.4          Diabetes: >6.4          Glycemic control for adults with diabetes: <7.0   . Est. average glucose Bld gHb Est-m* 05/31/2018 169  mg/dL Final    Physical Examination:  BP 130/68 (BP Location: Left Arm, Patient Position: Sitting, Cuff Size: Normal)   Pulse 93   Ht 5\' 10"  (1.778 m)   Wt 211 lb 3.2 oz (95.8 kg)   SpO2 95%   BMI 30.30 kg/m   Diabetic Foot Exam - Simple   Simple Foot Form Diabetic Foot exam was performed with the following findings:  Yes   Visual Inspection No deformities, no ulcerations, no other skin breakdown bilaterally:  Yes See comments:  Yes Sensation Testing Intact to touch and monofilament testing bilaterally:  Yes Pulse Check Posterior Tibialis and Dorsalis pulse intact bilaterally:  Yes Comments Some callus formation on the left foot at the base of the first toe medially     ASSESSMENT:  Diabetes type 2, insulin-dependent  See history of present illness for detailed discussion of current diabetes management, blood sugar patterns and problems identified  His A1c has gone up to his recent highest level of 7.5 Reason for his  hyperglycemia and problems with his management are discussed above Will need to improve his lifestyle as well as increase his insulin for now He does need to cover his larger meals at lunch also with mealtime insulin Basic diabetes education given He does do better when he is able to keep his weight down with exercise  HYPERTENSION: Well controlled   No evidence of neuropathy on exam  PLAN:    Increase Toujeo up to 38 units  Switch to [place] on his next prescription  Take 8 to 10 units NovoLog at lunch when eating a big meal  Overall needs to cut back on portion  We will need to see him back in 3 months  He will also check more readings consistently after meals in the morning  Discussed blood sugar targets  Explained to him how mealtime insulin works and action time and timing of injection as well as adjustment based on carbohydrate intake and meal size  We will see his PCP to treat his knee pain and restart exercise as tolerated  To try and get blood sugars under 180 at least after meals    Patient Instructions  Take 8-10 Novolog at lunch  38 Toujeo   Counseling time on subjects discussed in assessment and plan sections is over 50% of today's 25 minute visit    Reather LittlerAjay Jasie Meleski 06/07/2018, 8:15 AM   Note: This office note was  prepared with Estate agent. Any transcriptional errors that result from this process are unintentional.

## 2018-06-07 ENCOUNTER — Encounter: Payer: Self-pay | Admitting: Endocrinology

## 2018-06-07 ENCOUNTER — Ambulatory Visit: Payer: PPO | Admitting: Endocrinology

## 2018-06-07 VITALS — BP 130/68 | HR 93 | Ht 70.0 in | Wt 211.2 lb

## 2018-06-07 DIAGNOSIS — Z794 Long term (current) use of insulin: Secondary | ICD-10-CM

## 2018-06-07 DIAGNOSIS — E782 Mixed hyperlipidemia: Secondary | ICD-10-CM | POA: Diagnosis not present

## 2018-06-07 DIAGNOSIS — E1165 Type 2 diabetes mellitus with hyperglycemia: Secondary | ICD-10-CM

## 2018-06-07 NOTE — Patient Instructions (Addendum)
Take 8-10 Novolog for bigger lunch  Watch portions  38 Toujeo   Call before refilling

## 2018-06-13 ENCOUNTER — Other Ambulatory Visit: Payer: Self-pay | Admitting: Endocrinology

## 2018-07-01 ENCOUNTER — Telehealth: Payer: Self-pay | Admitting: Endocrinology

## 2018-07-01 NOTE — Telephone Encounter (Signed)
MEDICATION: [name]  PHARMACY:  [facility] on [address]  IS THIS A 90 DAY SUPPLY : unknown  IS PATIENT OUT OF MEDICATION: N/A  IF NOT; HOW MUCH IS LEFT:   LAST APPOINTMENT DATE: @1 /27/2020  NEXT APPOINTMENT DATE:@4 /23/2020  DO WE HAVE YOUR PERMISSION TO LEAVE A DETAILED MESSAGE:  YES  OTHER COMMENTS: Patient states that he is currently down to his last pen [employer] and was told to call and request a prescription for the Tresiba since Dr Lucianne Muss wanted him to switch medications.   **Let patient know to contact pharmacy at the end of the day to make sure medication is ready. **  ** Please notify patient to allow 48-72 hours to process**  **Encourage patient to contact the pharmacy for refills or they can request refills through [facility]**

## 2018-07-04 ENCOUNTER — Other Ambulatory Visit: Payer: Self-pay

## 2018-07-04 MED ORDER — INSULIN GLARGINE (1 UNIT DIAL) 300 UNIT/ML ~~LOC~~ SOPN: 35.0000 [IU] | PEN_INJECTOR | Freq: Every day | SUBCUTANEOUS | 3 refills | Status: DC

## 2018-07-04 NOTE — Telephone Encounter (Signed)
Rx sent 

## 2018-07-05 ENCOUNTER — Other Ambulatory Visit: Payer: Self-pay

## 2018-07-05 MED ORDER — INSULIN DEGLUDEC 200 UNIT/ML ~~LOC~~ SOPN: 38.0000 [IU] | PEN_INJECTOR | Freq: Every day | SUBCUTANEOUS | 3 refills | Status: DC

## 2018-07-05 NOTE — Telephone Encounter (Signed)
Patient is confused on why toujeo was sent to the pharmacy. Patient is stating that at his last appointment him and Dr.Kumar discussed his starting [place].  Please Advise, thanks

## 2018-07-05 NOTE — Telephone Encounter (Signed)
Please advise at to what dose of Trajendta you would like to start the pt on.

## 2018-07-05 NOTE — Telephone Encounter (Signed)
He was supposed to let us know before asking for refill for Toujeo.  He will take 38 units of Tresiba U-200 daily

## 2018-07-05 NOTE — Telephone Encounter (Signed)
Rx for tresiba sent to pharmacy.

## 2018-08-02 ENCOUNTER — Other Ambulatory Visit: Payer: Self-pay

## 2018-08-02 ENCOUNTER — Other Ambulatory Visit: Payer: Self-pay | Admitting: Endocrinology

## 2018-08-02 ENCOUNTER — Telehealth: Payer: Self-pay | Admitting: Family Medicine

## 2018-08-02 ENCOUNTER — Other Ambulatory Visit: Payer: Self-pay | Admitting: Adult Health

## 2018-08-02 DIAGNOSIS — M160 Bilateral primary osteoarthritis of hip: Secondary | ICD-10-CM

## 2018-08-02 MED ORDER — ALPRAZOLAM 0.5 MG PO TABS: ORAL_TABLET | ORAL | 0 refills | Status: DC

## 2018-08-02 MED ORDER — MELOXICAM 15 MG PO TABS: ORAL_TABLET | ORAL | 0 refills | Status: DC

## 2018-08-02 NOTE — Telephone Encounter (Signed)
Patient requesting a refill on Xanax. Med last filled 03/10/18  #30 no refills.  Patient last seen for chronic care on 03/10/2018 and has follow up on 09/01/2018 Controlled substance database reviewed no aberrancies noted. MPulliam, CMA/RT(R)

## 2018-08-02 NOTE — Telephone Encounter (Signed)
Pt called states going out of town on 3/27 for several weeks & needs his Rxs refilled before he leaves.  --Forwarding request to medical assistant for review approval w/ provider for Rx :   ALPRAZolam Prudy Feeler) 0.5 MG tablet [[id number]]   Order Details  Dose, Route, Frequency: As Directed   Indications of Use: Anxiety  Dispense Quantity: 30 tablet Refills: 0 Fills remaining: --        Sig: take 1 tablet by mouth if needed for anxiety          &   meloxicam (MOBIC) 15 MG tablet [[id number]]   Order Details  Dose, Route, Frequency: As Directed   Dispense Quantity: 90 tablet Refills: 1 Fills remaining: --        Sig: take 1/2 to 1 tablet by mouth once daily for pain  Patient taking differently: take 1/2 to 1 tablet by mouth once daily for pain          --- Please send refill order to :  [facility] [phone] [place], [place] - [address] AT [facility] [address] & [address] [phone] (Phone) [phone] (Fax)   --glh

## 2018-08-02 NOTE — Telephone Encounter (Signed)
Refilled mobic and sent request for xanax to William Hamburger, NP to review. MPulliam, CMA/RT(R)

## 2018-09-01 ENCOUNTER — Other Ambulatory Visit: Payer: Self-pay

## 2018-09-01 ENCOUNTER — Other Ambulatory Visit: Payer: PPO

## 2018-09-01 DIAGNOSIS — Z794 Long term (current) use of insulin: Secondary | ICD-10-CM | POA: Diagnosis not present

## 2018-09-01 DIAGNOSIS — E1165 Type 2 diabetes mellitus with hyperglycemia: Secondary | ICD-10-CM

## 2018-09-01 DIAGNOSIS — E782 Mixed hyperlipidemia: Secondary | ICD-10-CM | POA: Diagnosis not present

## 2018-09-01 NOTE — Addendum Note (Signed)
Addended by: Stan Head on: 09/01/2018 09:01 AM   Modules accepted: Orders

## 2018-09-02 LAB — COMPREHENSIVE METABOLIC PANEL
ALT: 25 IU/L (ref 0–44)
AST: 16 IU/L (ref 0–40)
Albumin/Globulin Ratio: 1.9 (ref 1.2–2.2)
Albumin: 4.7 g/dL (ref 3.8–4.8)
Alkaline Phosphatase: 66 IU/L (ref 39–117)
BUN/Creatinine Ratio: 25 — ABNORMAL HIGH (ref 10–24)
BUN: 20 mg/dL (ref 8–27)
Bilirubin Total: 0.5 mg/dL (ref 0.0–1.2)
CO2: 21 mmol/L (ref 20–29)
Calcium: 9.2 mg/dL (ref 8.6–10.2)
Chloride: 98 mmol/L (ref 96–106)
Creatinine, Ser: 0.79 mg/dL (ref 0.76–1.27)
GFR calc Af Amer: 105 mL/min/{1.73_m2} (ref >59)
GFR calc non Af Amer: 91 mL/min/{1.73_m2} (ref >59)
Globulin, Total: 2.5 g/dL (ref 1.5–4.5)
Glucose: 134 mg/dL — ABNORMAL HIGH (ref 65–99)
Potassium: 4.9 mmol/L (ref 3.5–5.2)
Sodium: 137 mmol/L (ref 134–144)
Total Protein: 7.2 g/dL (ref 6.0–8.5)

## 2018-09-02 LAB — LIPID PANEL
Chol/HDL Ratio: 3.1 ratio (ref 0.0–5.0)
Cholesterol, Total: 113 mg/dL (ref 100–199)
HDL: 36 mg/dL — ABNORMAL LOW (ref >39)
LDL Calculated: 53 mg/dL (ref 0–99)
Triglycerides: 118 mg/dL (ref 0–149)
VLDL Cholesterol Cal: 24 mg/dL (ref 5–40)

## 2018-09-02 LAB — HEMOGLOBIN A1C
Est. average glucose Bld gHb Est-mCnc: 166 mg/dL
Hgb A1c MFr Bld: 7.4 % — ABNORMAL HIGH (ref 4.8–5.6)

## 2018-09-07 ENCOUNTER — Other Ambulatory Visit: Payer: Self-pay

## 2018-09-07 DIAGNOSIS — E781 Pure hyperglyceridemia: Secondary | ICD-10-CM

## 2018-09-07 MED ORDER — SIMVASTATIN 40 MG PO TABS: 40.0000 mg | ORAL_TABLET | Freq: Every day | ORAL | 1 refills | Status: DC

## 2018-09-07 NOTE — Telephone Encounter (Signed)
Pharmacy sent over refill request for Simvastatin - refill sent in per office policy. MPulliam, CMA/RT(R)

## 2018-09-08 ENCOUNTER — Other Ambulatory Visit: Payer: Self-pay

## 2018-09-08 ENCOUNTER — Ambulatory Visit (INDEPENDENT_AMBULATORY_CARE_PROVIDER_SITE_OTHER): Payer: PPO | Admitting: Endocrinology

## 2018-09-08 ENCOUNTER — Ambulatory Visit: Payer: PPO | Admitting: Endocrinology

## 2018-09-08 ENCOUNTER — Encounter: Payer: Self-pay | Admitting: Endocrinology

## 2018-09-08 DIAGNOSIS — Z794 Long term (current) use of insulin: Secondary | ICD-10-CM

## 2018-09-08 DIAGNOSIS — E1165 Type 2 diabetes mellitus with hyperglycemia: Secondary | ICD-10-CM | POA: Diagnosis not present

## 2018-09-08 MED ORDER — LISINOPRIL 5 MG PO TABS: 5.0000 mg | ORAL_TABLET | Freq: Every day | ORAL | 1 refills | Status: DC

## 2018-09-08 NOTE — Progress Notes (Signed)
Patient ID: Jose Munoz, male   DOB: Aug 16, 1950, 68 y.o.   MRN: [id number]           Reason for Appointment:  Follow-up for Type 2 Diabetes  Referring physician: Opalski   Today's office visit was provided via telemedicine using video technique Explained to the patient and the the limitations of evaluation and management by telemedicine and the availability of in person appointments.  The patient understood the limitations and agreed to proceed. Patient also understood that the telehealth visit is billable. . Location of the patient: Home . Location of the provider: Office Only the patient and myself were participating in the encounter    History of Present Illness:          Date of diagnosis of type 2 diabetes mellitus: 1998       Background history:   He thinks his diabetes was relatively easier to control and the onset even though he had initial symptoms of thirst and weight loss He had done very well with his diet and lost a significant amount of weight and was approved to control his diabetes without medications for a few years Subsequently he started with oral medications including metformin, Actos and Amaryl He thinks his blood sugars were well controlled for a few years initially but started getting higher progressively since 2016. Because of poor control he was started on basal bolus insulin regimen on his initial consultation, highest A1c was 11.9 in 01/2016  Recent history:    A1c is about the same at 7.4 Previous range 6.6-7.5  INSULIN regimen: TRESIBA 38  units daily at bedtime, Novolog 10-14 units at dinner mostly  Non-insulin hypoglycemic drugs the patient is taking are:Invokamet 150/1000 twice a day  Current management, blood sugar patterns and problems identified:  His A1c is not yet improved  Although he was told to increase his Toujeo on the last visit and switch to [place] his blood sugar did not improve until about 2 or 3 weeks ago when he finally  switched to [place]  With this his fasting readings are significantly better  Previously his morning sugars were averaging about 160  As before he does not check enough readings after meals but they do not appear to be recently any higher than usual  AVERAGE blood sugar overall is also better  Lab glucose fasting was 134 but he may have had 3 cups of coffee that day  He says he is trying to take some insulin at lunch if he has more carbohydrates  Otherwise taking the NovoLog 10-12 units usually consistently with dinnertime  Normally does not eat breakfast except couple of times a week and has only a toast at that time with egg No hypoglycemic symptoms again       Side effects from medications have been: None  Compliance with the medical regimen: Fair  Glucose monitoring:  done less than 1 times a day         Glucometer:   One Touch Verio  Blood sugars by patient history:  FASTING range 115-131 After dinner 134-161 with only 3 readings AVERAGE for 30 days = 135, previously 151  Previous readings:  PRE-MEAL Fasting Lunch Dinner Bedtime Overall  Glucose range:  151-164  131-180  150    Mean/median:  159     151   POST-MEAL PC Breakfast PC Lunch PC Dinner  Glucose range:    133-152  Mean/median:        Self-care: The diet that the patient  has been following is: tries to limit sweets and drinks with sugar .      Typical meal intake: Breakfast is egg, toast occasionally, sometimes skipped or; Lunch is a sandwich, has various snacks    Dinner 5-7 P.m.            Dietician visit, most recent: 06/2016 CDE visit: 11/17               Exercise:  Tries to walk on treadmill, about 3 times a week  Weight history:  Wt Readings from Last 3 Encounters:  06/07/18 211 lb 3.2 oz (95.8 kg)  03/10/18 209 lb 8 oz (95 kg)  12/02/17 205 lb 3.2 oz (93.1 kg)    Glycemic control:   Lab Results  Component Value Date   HGBA1C 7.4 (H) 09/01/2018   HGBA1C 7.5 (H) 05/31/2018   HGBA1C  7.1 (H) 03/03/2018   Lab Results  Component Value Date   MICROALBUR 4.3 (H) 08/11/2016   LDLCALC 53 09/01/2018   CREATININE 0.79 09/01/2018   Lab Results  Component Value Date   MICRALBCREAT 5.2 08/11/2016       Allergies as of 09/08/2018   No Known Allergies     Medication List       Accurate as of September 08, 2018  9:23 AM. Always use your most recent med list.        ALPRAZolam 0.5 MG tablet Commonly known as:  XANAX take 1 tablet by mouth if needed for anxiety   aspirin 81 MG tablet Take 81 mg by mouth daily.   Insulin Degludec 200 UNIT/ML Sopn Commonly known as:  [place] FlexTouch Inject 38 Units into the skin daily. INJECT 38 UNITS UNDER THE SKIN ONCE DAILY.   Insulin Pen Needle 32G X 4 MM Misc Commonly known as:  BD Pen Needle Nano U/F USE TWICE DAILY   Invokamet [phone] MG Tabs Generic drug:  Canagliflozin-metFORMIN HCl TAKE 2 TABLETS BY MOUTH DAILY   lisinopril 5 MG tablet Commonly known as:  ZESTRIL Take 1 tablet (5 mg total) by mouth daily.   NovoLOG FlexPen 100 UNIT/ML FlexPen Generic drug:  insulin aspart INJECT 8-10 UNITS SUBCUTANEOUS BEFORE MAIN MEAL   onetouch ultrasoft lancets Use to test blood sugar 2 times daily- Dx code E11.9   OneTouch Verio test strip Generic drug:  glucose blood TEST TWICE DAILY   simvastatin 40 MG tablet Commonly known as:  ZOCOR Take 1 tablet (40 mg total) by mouth daily.   Vitamin D (Ergocalciferol) 1.25 MG (50000 UT) Caps capsule Commonly known as:  DRISDOL Take 1 capsule (50,000 Units total) by mouth every 7 (seven) days.       Allergies: No Known Allergies  Past Medical History:  Diagnosis Date  . Diabetes mellitus without complication (HCC)   . Essential hypertension 10/16/2015  . Hyperlipidemia   . OA (osteoarthritis) of hip 11/05/2015  . Obese 10/16/2015  . Smoking   . Vitamin D deficiency 11/05/2015    History reviewed. No pertinent surgical history.  Family History  Problem Relation  Age of Onset  . Diabetes Mother   . Congestive Heart Failure Mother   . Cancer Sister        breast  . Cancer Sister        cervical  . Heart attack Brother     Social History:  reports that he has been smoking cigarettes. He has been smoking about 1.00 pack per day. He has never used smokeless tobacco. He reports  current alcohol use of about 3.0 standard drinks of alcohol per week. He reports that he does not use drugs.   Review of Systems   Lipid history: Has good LDL levels with taking simvastatin 40 mg prescribed by his PCP Also triglycerides are excellent     Lab Results  Component Value Date   CHOL 113 09/01/2018   HDL 36 (L) 09/01/2018   LDLCALC 53 09/01/2018   TRIG 118 09/01/2018   CHOLHDL 3.1 09/01/2018           Hypertension:  is being controlled with lisinopril 5 mg  Also on Invokana  Blood pressure recently at home 128/80  BP Readings from Last 3 Encounters:  06/07/18 130/68  03/10/18 135/83  12/02/17 118/70     Has eye exams regularly, last done on 04/05/2018   Most recent foot exam: 05/2018    LABS:  No visits with results within 1 Week(s) from this visit.  Latest known visit with results is:  Lab on 09/01/2018  Component Date Value Ref Range Status  . Cholesterol, Total 09/01/2018 113  100 - 199 mg/dL Final  . Triglycerides 09/01/2018 118  0 - 149 mg/dL Final  . HDL 16/02/9603 36* >39 mg/dL Final  . VLDL Cholesterol Cal 09/01/2018 24  5 - 40 mg/dL Final  . LDL Calculated 09/01/2018 53  0 - 99 mg/dL Final  . Chol/HDL Ratio 09/01/2018 3.1  0.0 - 5.0 ratio Final   Comment:                                   T. Chol/HDL Ratio                                             Men  Women                               1/2 Avg.Risk  3.4    3.3                                   Avg.Risk  5.0    4.4                                2X Avg.Risk  9.6    7.1                                3X Avg.Risk 23.4   11.0   . Glucose 09/01/2018 134* 65 - 99 mg/dL  Final  . BUN 54/01/8118 20  8 - 27 mg/dL Final  . Creatinine, Ser 09/01/2018 0.79  0.76 - 1.27 mg/dL Final  . GFR calc non Af Amer 09/01/2018 91  >59 mL/min/1.73 Final  . GFR calc Af Amer 09/01/2018 105  >59 mL/min/1.73 Final  . BUN/Creatinine Ratio 09/01/2018 25* 10 - 24 Final  . Sodium 09/01/2018 137  134 - 144 mmol/L Final  . Potassium 09/01/2018 4.9  3.5 - 5.2 mmol/L Final  . Chloride 09/01/2018 98  96 - 106 mmol/L Final  . CO2 09/01/2018 21  20 -  29 mmol/L Final  . Calcium 09/01/2018 9.2  8.6 - 10.2 mg/dL Final  . Total Protein 09/01/2018 7.2  6.0 - 8.5 g/dL Final  . Albumin 56/21/308604/16/2020 4.7  3.8 - 4.8 g/dL Final  . Globulin, Total 09/01/2018 2.5  1.5 - 4.5 g/dL Final  . Albumin/Globulin Ratio 09/01/2018 1.9  1.2 - 2.2 Final  . Bilirubin Total 09/01/2018 0.5  0.0 - 1.2 mg/dL Final  . Alkaline Phosphatase 09/01/2018 66  39 - 117 IU/L Final  . AST 09/01/2018 16  0 - 40 IU/L Final  . ALT 09/01/2018 25  0 - 44 IU/L Final  . Hgb A1c MFr Bld 09/01/2018 7.4* 4.8 - 5.6 % Final   Comment:          Prediabetes: 5.7 - 6.4          Diabetes: >6.4          Glycemic control for adults with diabetes: <7.0   . Est. average glucose Bld gHb Est-m* 09/01/2018 166  mg/dL Final    Physical Examination:  There were no vitals taken for this visit.   ASSESSMENT:  Diabetes type 2, insulin-dependent  See history of present illness for detailed discussion of current diabetes management, blood sugar patterns and problems identified  His A1c is 7.4  Although it has been previously lower he seems to have recently better blood sugars with switching from Toujeo to [place] the same doses Not checking enough readings after meals but recently not unusually high  He has tried to do a little walking but limited by pain in his knees and not clear if he has lost weight Also trying to take NovoLog more consistently with any meal that has high carbohydrate  HYPERTENSION: Well controlled  Lipids: Well  controlled   PLAN:    No change in insulin doses, stay with [name] instead of Toujeo  Continue same doses of Invokamet  Discussed that he can check his sugars more often after meals to help him with adjusting his NovoLog especially with checking more readings after lunch  Exercise as much as possible  Recheck A1c in 3 months    There are no Patient Instructions on file for this visit.     Reather LittlerAjay Rashada Klontz 09/08/2018, 9:23 AM   Note: This office note was prepared with Dragon voice recognition system technology. Any transcriptional errors that result from this process are unintentional.

## 2018-09-08 NOTE — Progress Notes (Signed)
Pharmacy sent refill request for lisinopril.  Reviewed chart and sent in refills per office policy. MPulliam, CMA/RT(R)

## 2018-09-13 ENCOUNTER — Other Ambulatory Visit: Payer: Self-pay

## 2018-09-13 ENCOUNTER — Ambulatory Visit: Payer: PPO | Admitting: Family Medicine

## 2018-09-14 ENCOUNTER — Ambulatory Visit (INDEPENDENT_AMBULATORY_CARE_PROVIDER_SITE_OTHER): Payer: PPO | Admitting: Adult Health

## 2018-09-14 ENCOUNTER — Encounter: Payer: Self-pay | Admitting: Adult Health

## 2018-09-14 ENCOUNTER — Ambulatory Visit: Payer: PPO | Admitting: Family Medicine

## 2018-09-14 VITALS — BP 128/70 | HR 87 | Temp 98.1°F | Ht 70.0 in | Wt 212.8 lb

## 2018-09-14 DIAGNOSIS — H6122 Impacted cerumen, left ear: Secondary | ICD-10-CM | POA: Diagnosis not present

## 2018-09-14 NOTE — Assessment & Plan Note (Signed)
Both ears irrigated- pt tolerated well. Hearing restored to normal baseline. Please call with any questions/concerns. Continue to Social Distance when out of the home and wearing a facial mask.

## 2018-09-14 NOTE — Patient Instructions (Signed)
Earwax Buildup, Adult The ears produce a substance called earwax that helps keep bacteria out of the ear and protects the skin in the ear canal. Occasionally, earwax can build up in the ear and cause discomfort or hearing loss. What increases the risk? This condition is more likely to develop in people who:  Are male.  Are elderly.  Naturally produce more earwax.  Clean their ears often with cotton swabs.  Use earplugs often.  Use in-ear headphones often.  Wear hearing aids.  Have narrow ear canals.  Have earwax that is overly thick or sticky.  Have eczema.  Are dehydrated.  Have excess hair in the ear canal. What are the signs or symptoms? Symptoms of this condition include:  Reduced or muffled hearing.  A feeling of fullness in the ear or feeling that the ear is plugged.  Fluid coming from the ear.  Ear pain.  Ear itch.  Ringing in the ear.  Coughing.  An obvious piece of earwax that can be seen inside the ear canal. How is this diagnosed? This condition may be diagnosed based on:  Your symptoms.  Your medical history.  An ear exam. During the exam, your health care provider will look into your ear with an instrument called an otoscope. You may have tests, including a hearing test. How is this treated? This condition may be treated by:  Using ear drops to soften the earwax.  Having the earwax removed by a health care provider. The health care provider may: ? Flush the ear with water. ? Use an instrument that has a loop on the end (curette). ? Use a suction device.  Surgery to remove the wax buildup. This may be done in severe cases. Follow these instructions at home:   Take over-the-counter and prescription medicines only as told by your health care provider.  Do not put any objects, including cotton swabs, into your ear. You can clean the opening of your ear canal with a washcloth or facial tissue.  Follow instructions from your health care  provider about cleaning your ears. Do not over-clean your ears.  Drink enough fluid to keep your urine clear or pale yellow. This will help to thin the earwax.  Keep all follow-up visits as told by your health care provider. If earwax builds up in your ears often or if you use hearing aids, consider seeing your health care provider for routine, preventive ear cleanings. Ask your health care provider how often you should schedule your cleanings.  If you have hearing aids, clean them according to instructions from the manufacturer and your health care provider. Contact a health care provider if:  You have ear pain.  You develop a fever.  You have blood, pus, or other fluid coming from your ear.  You have hearing loss.  You have ringing in your ears that does not go away.  Your symptoms do not improve with treatment.  You feel like the room is spinning (vertigo). Summary  Earwax can build up in the ear and cause discomfort or hearing loss.  The most common symptoms of this condition include reduced or muffled hearing and a feeling of fullness in the ear or feeling that the ear is plugged.  This condition may be diagnosed based on your symptoms, your medical history, and an ear exam.  This condition may be treated by using ear drops to soften the earwax or by having the earwax removed by a health care provider.  Do not put any   objects, including cotton swabs, into your ear. You can clean the opening of your ear canal with a washcloth or facial tissue. This information is not intended to replace advice given to you by your health care provider. Make sure you discuss any questions you have with your health care provider. Document Released: 06/11/2004 Document Revised: 04/15/2017 Document Reviewed: 07/15/2016 Elsevier Interactive Patient Education  2019 [employer].  After the irrigation both ears are clear! Please call with any questions/concerns. Continue to Social Distance when  out of the home and wearing a facial mask. BE SAFE!

## 2018-09-14 NOTE — Progress Notes (Signed)
Subjective:    Patient ID: Jose Munoz, male    DOB: 06/20/1950, 68 y.o.   MRN: [id number]  HPI:  Jose Munoz presents with L ear cerumen impaction and subsequent decrease in hearing that developed this morning. He denies nasal drainage/cough/fever/dyspnea/CP/chest heaviness/ear pain. He denies dizziness/imbalance/N/V He reports experiencing brief period of vertigo a few weeks ago that self-resolved. He reports re-current cerumen impaction in adulthood. He continues to smoke- pack/day- declined tobacco cessation today He denies known exposure COVID-19 He has been remaining home or golfing and adhering to social distancing consistently. He has not been wearing a mask, when out in public encouraged to start  Patient Care Team    Relationship Specialty Notifications Start End  Jose Lot, DO PCP - General Family Medicine  10/16/15     Patient Active Problem List   Diagnosis Date Noted  . Hearing loss of left ear due to cerumen impaction 09/14/2018  . Mixed diabetic hyperlipidemia associated with type 2 diabetes mellitus (HCC) 11/12/2017  . Hypertension associated with diabetes (HCC) 11/12/2017  . OA (osteoarthritis) of hip 11/05/2015  . Vitamin D deficiency 11/05/2015  . smoking cessation counseling 11/05/2015  . Diabetes mellitus without complication (HCC) 10/16/2015  . Smoking 10/16/2015  . Obese 10/16/2015     Past Medical History:  Diagnosis Date  . Diabetes mellitus without complication (HCC)   . Essential hypertension 10/16/2015  . Hyperlipidemia   . OA (osteoarthritis) of hip 11/05/2015  . Obese 10/16/2015  . Smoking   . Vitamin D deficiency 11/05/2015     History reviewed. No pertinent surgical history.   Family History  Problem Relation Age of Onset  . Diabetes Mother   . Congestive Heart Failure Mother   . Cancer Sister        breast  . Cancer Sister        cervical  . Heart attack Brother      Social History   Substance and Sexual Activity   Drug Use No     Social History   Substance and Sexual Activity  Alcohol Use Yes  . Alcohol/week: 3.0 standard drinks  . Types: 1 Glasses of wine, 2 Shots of liquor per week     Social History   Tobacco Use  Smoking Status Current Every Day Smoker  . Packs/day: 1.00  . Types: Cigarettes  Smokeless Tobacco Never Used     Outpatient Encounter Medications as of 09/14/2018  Medication Sig  . ALPRAZolam (XANAX) 0.5 MG tablet take 1 tablet by mouth if needed for anxiety  . aspirin 81 MG tablet Take 81 mg by mouth daily.  . Insulin Degludec (TRESIBA FLEXTOUCH) 200 UNIT/ML SOPN Inject 38 Units into the skin daily. INJECT 38 UNITS UNDER THE SKIN ONCE DAILY.  Marland Kitchen Insulin Pen Needle (BD PEN NEEDLE NANO U/F) 32G X 4 MM MISC USE TWICE DAILY  . INVOKAMET [id number] MG TABS TAKE 2 TABLETS BY MOUTH DAILY  . Lancets (ONETOUCH ULTRASOFT) lancets Use to test blood sugar 2 times daily- Dx code E11.9  . lisinopril (ZESTRIL) 5 MG tablet Take 1 tablet (5 mg total) by mouth daily.  Marland Kitchen NOVOLOG FLEXPEN 100 UNIT/ML FlexPen INJECT 8-10 UNITS SUBCUTANEOUS BEFORE MAIN MEAL  . ONETOUCH VERIO test strip TEST TWICE DAILY  . simvastatin (ZOCOR) 40 MG tablet Take 1 tablet (40 mg total) by mouth daily.  . Vitamin D, Ergocalciferol, (DRISDOL) 50000 units CAPS capsule Take 1 capsule (50,000 Units total) by mouth every 7 (seven) days.   No facility-administered  encounter medications on file as of 09/14/2018.     Allergies: Patient has no known allergies.  Body mass index is 30.53 kg/m.  Blood pressure 128/70, pulse 87, temperature 98.1 F (36.7 C), temperature source Oral, height 5\' 10"  (1.778 m), weight 212 lb 12.8 oz (96.5 kg), SpO2 97 %.  Review of Systems  Constitutional: Positive for fatigue and fever. Negative for activity change, appetite change, chills, diaphoresis and unexpected weight change.  HENT: Positive for congestion and hearing loss. Negative for ear pain, facial swelling, nosebleeds,  postnasal drip and sore throat.   Eyes: Negative for visual disturbance.  Respiratory: Negative for cough, chest tightness, shortness of breath, wheezing and stridor.   Cardiovascular: Negative for chest pain, palpitations and leg swelling.  Endocrine: Negative for cold intolerance, heat intolerance, polydipsia, polyphagia and polyuria.  Neurological: Negative for dizziness and headaches.  Hematological: Does not bruise/bleed easily.       Objective:   Physical Exam Vitals signs reviewed.  Constitutional:      General: He is not in acute distress.    Appearance: He is not ill-appearing or toxic-appearing.  HENT:     Head: Normocephalic and atraumatic.     Right Ear: No decreased hearing noted. No swelling or tenderness. There is no impacted cerumen. Tympanic membrane is not erythematous or bulging.     Left Ear: Decreased hearing noted. No swelling or tenderness. There is impacted cerumen. Tympanic membrane is not erythematous or bulging.     Ears:     Comments: Both ears irrigated- large amount of cerumen removed from L ear Pt tolerated well, hearing restored to baseline after irrigation    Mouth/Throat:     Mouth: Mucous membranes are moist.     Pharynx: Oropharynx is clear. No oropharyngeal exudate or posterior oropharyngeal erythema.  Eyes:     Extraocular Movements: Extraocular movements intact.     Conjunctiva/sclera: Conjunctivae normal.     Pupils: Pupils are equal, round, and reactive to light.  Pulmonary:     Effort: Pulmonary effort is normal.  Skin:    General: Skin is warm and dry.     Capillary Refill: Capillary refill takes less than 2 seconds.  Neurological:     Mental Status: He is alert and oriented to person, place, and time.  Psychiatric:        Mood and Affect: Mood normal.        Behavior: Behavior normal.        Thought Content: Thought content normal.        Judgment: Judgment normal.       Assessment & Plan:   1. Hearing loss of left ear due to  cerumen impaction     Hearing loss of left ear due to cerumen impaction Both ears irrigated- pt tolerated well. Hearing restored to normal baseline. Please call with any questions/concerns. Continue to Social Distance when out of the home and wearing a facial mask.     FOLLOW-UP:  Return if symptoms worsen or fail to improve.

## 2018-09-26 ENCOUNTER — Other Ambulatory Visit: Payer: Self-pay | Admitting: Endocrinology

## 2018-10-25 DIAGNOSIS — H2513 Age-related nuclear cataract, bilateral: Secondary | ICD-10-CM | POA: Diagnosis not present

## 2018-10-26 ENCOUNTER — Other Ambulatory Visit: Payer: Self-pay | Admitting: Endocrinology

## 2018-10-29 ENCOUNTER — Other Ambulatory Visit: Payer: Self-pay | Admitting: Family Medicine

## 2018-10-29 DIAGNOSIS — M160 Bilateral primary osteoarthritis of hip: Secondary | ICD-10-CM

## 2018-11-01 NOTE — Telephone Encounter (Signed)
Called patient to verify if he is currently taking the medication.  Left message for the patient to call the office back. MPulliam, CMA/RT(R)

## 2018-11-02 NOTE — Telephone Encounter (Signed)
Spoke to patient he states that he takes them prn but does not take often and does not need a refill at this time. MPulliam, CMA/RT(R)

## 2018-11-14 ENCOUNTER — Other Ambulatory Visit: Payer: Self-pay | Admitting: Endocrinology

## 2018-11-15 ENCOUNTER — Other Ambulatory Visit: Payer: Self-pay | Admitting: Adult Health

## 2018-11-22 ENCOUNTER — Telehealth: Payer: Self-pay | Admitting: Family Medicine

## 2018-11-22 NOTE — Telephone Encounter (Signed)
Patient called states pharmacy told him Rx request for : --- (Was Denied )    ALPRAZolam (XANAX) 0.5 MG tablet [[id number]]   Order Details Dose, Route, Frequency: As Directed  Indications of Use: Anxiety  Dispense Quantity: 30 tablet Refills: 0 Fills remaining: --        Sig: take 1 tablet by mouth if needed for anxiety          ----Advised patient would forward message to medical assistant for review w/provider & someone would contact him .   --glh

## 2018-11-23 NOTE — Telephone Encounter (Signed)
Called patient notified that he needs an OV - patient will call back to make appointment. MPulliam, CMA/RT(R)

## 2018-11-28 NOTE — Telephone Encounter (Signed)
Needs review on [place] controlled substance website please. thnx

## 2018-11-29 NOTE — Telephone Encounter (Signed)
Spoke to patient he made an appointment to f/u on 12/13/2018.  Patient states that he is okay with waiting until then for refill.  [facility] [place] database reviewed report printed and given to Dr. Raliegh Scarlet for review.  Per office policy patient needs a contract on file. MPulliam, CMA/RT(R)

## 2018-12-04 ENCOUNTER — Other Ambulatory Visit: Payer: Self-pay | Admitting: Endocrinology

## 2018-12-13 ENCOUNTER — Other Ambulatory Visit: Payer: PPO

## 2018-12-13 ENCOUNTER — Other Ambulatory Visit: Payer: Self-pay

## 2018-12-13 ENCOUNTER — Other Ambulatory Visit (INDEPENDENT_AMBULATORY_CARE_PROVIDER_SITE_OTHER): Payer: PPO

## 2018-12-13 DIAGNOSIS — E1165 Type 2 diabetes mellitus with hyperglycemia: Secondary | ICD-10-CM

## 2018-12-13 DIAGNOSIS — Z794 Long term (current) use of insulin: Secondary | ICD-10-CM

## 2018-12-13 NOTE — Addendum Note (Signed)
Addended by: Lanier Prude D on: 12/13/2018 09:16 AM   Modules accepted: Orders

## 2018-12-14 LAB — BASIC METABOLIC PANEL
BUN/Creatinine Ratio: 19 (ref 10–24)
BUN: 15 mg/dL (ref 8–27)
CO2: 22 mmol/L (ref 20–29)
Calcium: 9.2 mg/dL (ref 8.6–10.2)
Chloride: 100 mmol/L (ref 96–106)
Creatinine, Ser: 0.81 mg/dL (ref 0.76–1.27)
GFR calc Af Amer: 104 mL/min/{1.73_m2} (ref >59)
GFR calc non Af Amer: 90 mL/min/{1.73_m2} (ref >59)
Glucose: 140 mg/dL — ABNORMAL HIGH (ref 65–99)
Potassium: 4.5 mmol/L (ref 3.5–5.2)
Sodium: 139 mmol/L (ref 134–144)

## 2018-12-14 LAB — HEMOGLOBIN A1C
Est. average glucose Bld gHb Est-mCnc: 160 mg/dL
Hgb A1c MFr Bld: 7.2 % — ABNORMAL HIGH (ref 4.8–5.6)

## 2018-12-15 NOTE — Progress Notes (Signed)
Patient ID: Jose Munoz, male   DOB: 09-16-1950, 68 y.o.   MRN: [id number]           Reason for Appointment:  Follow-up for Type 2 Diabetes  Referring physician: Opalski   Today's office visit was provided via telemedicine using video technique Explained to the patient and the the limitations of evaluation and management by telemedicine and the availability of in person appointments.  The patient understood the limitations and agreed to proceed. Patient also understood that the telehealth visit is billable. . Location of the patient: Home . Location of the provider: Office Only the patient and myself were participating in the encounter    History of Present Illness:          Date of diagnosis of type 2 diabetes mellitus: 1998       Background history:   He thinks his diabetes was relatively easier to control and the onset even though he had initial symptoms of thirst and weight loss He had done very well with his diet and lost a significant amount of weight and was approved to control his diabetes without medications for a few years Subsequently he started with oral medications including metformin, Actos and Amaryl He thinks his blood sugars were well controlled for a few years initially but started getting higher progressively since 2016. Because of poor control he was started on basal bolus insulin regimen on his initial consultation, highest A1c was 11.9 in 01/2016  Recent history:    INSULIN regimen: TRESIBA 38  units daily at bedtime, Novolog 10- 12 units at dinner  Non-insulin hypoglycemic drugs the patient is taking are:Invokamet 150/1000 twice a day  Current management, blood sugar patterns and problems identified:  His A1c is slightly better at 7.2 compared to 7.4  He says his blood sugars are high when he wakes up and may not be quite as high a couple of hours later  Also in the morning he usually drinks coffee after waking up  Previously his fasting readings  were as low as 115 and now they are appearing to be averaging about 150  Most likely he forgets to check his sugars after meals and no recent readings are available  He will not usually take mealtime insulin at lunch and not clear if his readings may be high after dinner  Also only adjusting the insulin by 2 units at dinnertime for variations in his diet, as above has only minimal monitoring after dinner  However he thinks that blood sugars are not mostly under 130 at bedtime  He still has some limited ability to exercise because of his hip pain and only doing some activities like playing golf  He feels that his weight is about the same No hypoglycemic symptoms during the day or overnight       Side effects from medications have been: None  Compliance with the medical regimen: Fair  Glucose monitoring:  done less than 1 times a day         Glucometer:   One Touch Verio  Blood sugars by patient history:   PRE-MEAL Fasting Lunch Dinner Bedtime Overall  Glucose range:  123-160   ?  120-130   Mean/median:      147   Previous data:  FASTING range 115-131 After dinner 134-161 with only 3 readings AVERAGE for 30 days = 135, previously 151   Self-care: The diet that the patient has been following is: tries to limit sweets and drinks with sugar .  Typical meal intake: Breakfast is egg, toast occasionally, sometimes skipped or; Lunch is a sandwich, has various snacks    Dinner 5-7 P.m.            Dietician visit, most recent: 06/2016 CDE visit: 11/17              Weight history:  Wt Readings from Last 3 Encounters:  09/14/18 212 lb 12.8 oz (96.5 kg)  06/07/18 211 lb 3.2 oz (95.8 kg)  03/10/18 209 lb 8 oz (95 kg)    Glycemic control:   Lab Results  Component Value Date   HGBA1C 7.2 (H) 12/13/2018   HGBA1C 7.4 (H) 09/01/2018   HGBA1C 7.5 (H) 05/31/2018   Lab Results  Component Value Date   MICROALBUR 4.3 (H) 08/11/2016   LDLCALC 53 09/01/2018   CREATININE 0.81  12/13/2018   Lab Results  Component Value Date   MICRALBCREAT 5.2 08/11/2016       Allergies as of 12/16/2018   No Known Allergies     Medication List       Accurate as of December 15, 2018  9:28 PM. If you have any questions, ask your nurse or doctor.        ALPRAZolam 0.5 MG tablet Commonly known as: XANAX take 1 tablet by mouth if needed for anxiety   aspirin 81 MG tablet Take 81 mg by mouth daily.   BD Pen Needle Nano U/F 32G X 4 MM Misc Generic drug: Insulin Pen Needle USE TWICE DAILY   Invokamet [id number] MG Tabs Generic drug: Canagliflozin-metFORMIN HCl TAKE 2 TABLETS BY MOUTH DAILY   lisinopril 5 MG tablet Commonly known as: ZESTRIL Take 1 tablet (5 mg total) by mouth daily.   NovoLOG FlexPen 100 UNIT/ML FlexPen Generic drug: insulin aspart INJECT 8-10 UNITS SUBCUTANEOUS BEFORE MAIN MEAL   onetouch ultrasoft lancets Use to test blood sugar 2 times daily- Dx code E11.9   OneTouch Verio test strip Generic drug: glucose blood TEST TWICE DAILY   simvastatin 40 MG tablet Commonly known as: ZOCOR Take 1 tablet (40 mg total) by mouth daily.   [name] FlexTouch 200 UNIT/ML Sopn Generic drug: Insulin Degludec INJECT 38 UNITS SUBCUTANEOUS EVERY DAY   Vitamin D (Ergocalciferol) 1.25 MG (50000 UT) Caps capsule Commonly known as: DRISDOL Take 1 capsule (50,000 Units total) by mouth every 7 (seven) days.       Allergies: No Known Allergies  Past Medical History:  Diagnosis Date  . Diabetes mellitus without complication (HCC)   . Essential hypertension 10/16/2015  . Hyperlipidemia   . OA (osteoarthritis) of hip 11/05/2015  . Obese 10/16/2015  . Smoking   . Vitamin D deficiency 11/05/2015    No past surgical history on file.  Family History  Problem Relation Age of Onset  . Diabetes Mother   . Congestive Heart Failure Mother   . Cancer Sister        breast  . Cancer Sister        cervical  . Heart attack Brother     Social History:  reports  that he has been smoking cigarettes. He has been smoking about 1.00 pack per day. He has never used smokeless tobacco. He reports current alcohol use of about 3.0 standard drinks of alcohol per week. He reports that he does not use drugs.   Review of Systems   Lipid history: Has good LDL levels with taking simvastatin 40 mg prescribed by his PCP Also triglycerides are normal  Lab Results  Component Value Date   CHOL 113 09/01/2018   HDL 36 (L) 09/01/2018   LDLCALC 53 09/01/2018   TRIG 118 09/01/2018   CHOLHDL 3.1 09/01/2018           Hypertension:  is being controlled with lisinopril 5 mg  Also on Invokana  Blood pressure recently at home 128/82 and checking once a week  BP Readings from Last 3 Encounters:  09/14/18 128/70  06/07/18 130/68  03/10/18 135/83     Has eye exams regularly, last done on 04/05/2018   Most recent foot exam: 05/2018    LABS:  Lab on 12/13/2018  Component Date Value Ref Range Status  . Glucose 12/13/2018 140* 65 - 99 mg/dL Final  . BUN 40/98/119107/28/2020 15  8 - 27 mg/dL Final  . Creatinine, Ser 12/13/2018 0.81  0.76 - 1.27 mg/dL Final  . GFR calc non Af Amer 12/13/2018 90  >59 mL/min/1.73 Final  . GFR calc Af Amer 12/13/2018 104  >59 mL/min/1.73 Final  . BUN/Creatinine Ratio 12/13/2018 19  10 - 24 Final  . Sodium 12/13/2018 139  134 - 144 mmol/L Final  . Potassium 12/13/2018 4.5  3.5 - 5.2 mmol/L Final  . Chloride 12/13/2018 100  96 - 106 mmol/L Final  . CO2 12/13/2018 22  20 - 29 mmol/L Final  . Calcium 12/13/2018 9.2  8.6 - 10.2 mg/dL Final  . Hgb Y7WA1c MFr Bld 12/13/2018 7.2* 4.8 - 5.6 % Final   Comment:          Prediabetes: 5.7 - 6.4          Diabetes: >6.4          Glycemic control for adults with diabetes: <7.0   . Est. average glucose Bld gHb Est-m* 12/13/2018 160  mg/dL Final    Physical Examination:  There were no vitals taken for this visit.   ASSESSMENT:  Diabetes type 2, insulin-dependent  See history of present  illness for detailed discussion of current diabetes management, blood sugar patterns and problems identified  His A1c is 7.2   He is on Invokamet and basal bolus insulin with NovoLog at dinner only  His A1c is still over 7% indicating inadequate insulin doses since fasting readings are mostly above target and also not clear if he is covering all his meals with insulin Currently mostly checking blood sugars fasting which are average 115 He thinks sugars are not high after dinner but no recent values available His average blood sugar at home is 147 indicating likely high readings after meals He has done a little walking but not enough exercise to lose weight Diet is generally fairly good   HYPERTENSION: Well controlled and monitor at home  History of microalbuminuria: Will need follow-up   PLAN:    No change in insulin doses, stay with [name] instead of Toujeo  Continue same doses of Invokamet  Discussed that he can check his sugars more often after meals to help him with adjusting his NovoLog especially with checking more readings after lunch  Exercise as much as possible  Recheck A1c in 3 months    There are no Patient Instructions on file for this visit.     Reather LittlerAjay Marah Park 12/15/2018, 9:28 PM   Note: This office note was prepared with Dragon voice recognition system technology. Any transcriptional errors that result from this process are unintentional.

## 2018-12-16 ENCOUNTER — Encounter: Payer: Self-pay | Admitting: Endocrinology

## 2018-12-16 ENCOUNTER — Ambulatory Visit (INDEPENDENT_AMBULATORY_CARE_PROVIDER_SITE_OTHER): Payer: PPO | Admitting: Endocrinology

## 2018-12-16 ENCOUNTER — Other Ambulatory Visit: Payer: Self-pay

## 2018-12-16 DIAGNOSIS — Z794 Long term (current) use of insulin: Secondary | ICD-10-CM

## 2018-12-16 DIAGNOSIS — E1129 Type 2 diabetes mellitus with other diabetic kidney complication: Secondary | ICD-10-CM

## 2018-12-16 DIAGNOSIS — E1165 Type 2 diabetes mellitus with hyperglycemia: Secondary | ICD-10-CM | POA: Diagnosis not present

## 2018-12-16 DIAGNOSIS — R809 Proteinuria, unspecified: Secondary | ICD-10-CM | POA: Diagnosis not present

## 2019-01-04 ENCOUNTER — Other Ambulatory Visit: Payer: Self-pay | Admitting: Endocrinology

## 2019-01-11 ENCOUNTER — Other Ambulatory Visit: Payer: Self-pay | Admitting: Family Medicine

## 2019-01-11 NOTE — Telephone Encounter (Signed)
Please see prior message.  I do not recommend refill at this time

## 2019-01-11 NOTE — Telephone Encounter (Signed)
Pt informed.  Pt expressed understanding.  T. Savonna Birchmeier, CMA 

## 2019-01-11 NOTE — Telephone Encounter (Signed)
Please call patient let him know that he is already had 2 refills in the past 12 months- one in Nov 2019 and 1 in March 2020.  I do not recommend he get a refill at this time as he would be taking too many.  Please advise patient that in order to not develop tolerance and repeat the negative side effects of the medicine, I recommend he slow down on the use and take it less often.  He would not be eligible for refill of this medicine until mid November 2020.

## 2019-01-11 NOTE — Telephone Encounter (Signed)
Patient called says has been out of  Xanax and is having trouble sleeping-- Pt request refill on :   ALPRAZolam (XANAX) 0.5 MG tablet [[id number]]   Order Details Dose, Route, Frequency: As Directed  Indications of Use: Anxiety  Dispense Quantity: 30 tablet Refills: 0 Fills remaining: --        Sig: take 1 tablet by mouth if needed for anxiety          ---Pt's Rx was pending for Telehealth/ OV & one is scheduled for 9/1 @ 4pm w/ Dr. Raliegh Scarlet he ask if she will please call him in enough to get to the appt date.  Forwarding message to medical asst to that if approved send to:  [facility] [place], [place] [address] [phone] (Phone) [phone] (Fax)   --glh

## 2019-01-17 ENCOUNTER — Ambulatory Visit (INDEPENDENT_AMBULATORY_CARE_PROVIDER_SITE_OTHER): Payer: PPO | Admitting: Family Medicine

## 2019-01-17 ENCOUNTER — Encounter: Payer: Self-pay | Admitting: Family Medicine

## 2019-01-17 VITALS — BP 126/82 | HR 92 | Temp 97.3°F | Ht 70.5 in | Wt 208.0 lb

## 2019-01-17 DIAGNOSIS — E119 Type 2 diabetes mellitus without complications: Secondary | ICD-10-CM | POA: Diagnosis not present

## 2019-01-17 DIAGNOSIS — Z7189 Other specified counseling: Secondary | ICD-10-CM

## 2019-01-17 DIAGNOSIS — G4700 Insomnia, unspecified: Secondary | ICD-10-CM | POA: Diagnosis not present

## 2019-01-17 DIAGNOSIS — F419 Anxiety disorder, unspecified: Secondary | ICD-10-CM

## 2019-01-17 MED ORDER — TRAZODONE HCL 50 MG PO TABS: 25.0000 mg | ORAL_TABLET | Freq: Every evening | ORAL | 0 refills | Status: DC | PRN

## 2019-01-17 NOTE — Progress Notes (Signed)
Virtual / live video office visit note for [employer], D.O- Primary Care Physician at [facility]   I connected with current patient today and beyond visually recognizing the correct individual, I verified that I am speaking with the correct person using two identifiers.  . Location of the patient: Home . Location of the provider: Office Only the patient (+/- their family members at pt's discretion) and myself were participating in the encounter    - This visit type was conducted due to national recommendations for restrictions regarding the COVID-19 Pandemic (e.g. social distancing) in an effort to limit this patient's exposure and mitigate transmission in our community.  This format is felt to be most appropriate for this patient at this time.   - The patient did have access to video technology today   - No physical exam could be performed with this format, beyond that communicated to [place] by the patient/ family members as noted.   - Additionally my office staff/ schedulers discussed with the patient that there may be a monetary charge related to this service, depending on patient's medical insurance.   The patient expressed understanding, and agreed to proceed.      History of Present Illness:  States he's been good, "as good as anyone can be in this situation we're in."  DM:  Says "I feel good, I don't have any issues to deal with."  His A1c came down to 7.2 from 7.4 prior.  Not checking sugars but no issues or complaints. denies any new symptoms or concerns Notes Dr. Lucianne Muss "gave him the okay" on his blood sugars.   Mood During COVID Says he's "doing okay, doc."  Notes "I don't really do much.  I try to play golf a few times per week, and go to the store when I have to, and that's pretty much my life anymore."  Says his life is "pretty depressing and stressful, but it is what it is I guess."  Regarding his Xanax, state in the past he usually would only take a Xanax (rarely)  when he felt anxious about going to sleep.   -  Also states "quite honestly doc, I really haven't taken that many of 'em because unfortunately I took one a couple of months ago and set the container down on the cabinet and went to put the lid on it, knocked it over, and several of them went down the sink."  Says "it's no big deal, look, I'm surviving."    Denies panic attacks.  States never took Xanax prior to the passing of his wife.  Says he "still has some days where he misses her more than others."  States "good most of the time, but I have those days when I'm not so good."  Remarks that he went through a stage of depression in his early 20's.  Says usually he's in a good mood but does endorse in his 33s, he did have a bout of bad depression and he says "I never want to feel that way again ".  Says some days are better than others.  - sleep has not been that good.  Goes to bed at nine PM and sometimes wakes up wide awake at 3:30 AM.  Says "I just get up and turn on the television and drink a couple cups of coffee and I'm good to go."  Says in the past he usually would only take a Xanax (rarely) when he felt anxious about  going to sleep.  Says he usually gets 6.5 to 7 hours of sleep per night.  Patient asks what is appropriate.  -->  He does endorse also taking naps during the day and states he does not sleep as well at night when he does  "Honestly I feel good.  I think it's just this whole thing.  I'm not seeing my kids and my grandkids as much because I don't know who they've been around, and I'm trying to do the right thing."    [facility] 2/9 03/10/2018 11/12/2017 10/25/2017 05/17/2017 07/02/2016  Decreased Interest 0 0 0 0 0  Down, Depressed, Hopeless 0 0 0 0 0  PHQ - 2 Score 0 0 0 0 0  Altered sleeping 0 0 0 0 -  Tired, decreased energy 0 0 0 0 -  Change in appetite 0 0 0 0 -  Feeling bad or failure about yourself  0 0 0 0 -  Trouble concentrating 0 0 0 0 -  Moving slowly  or fidgety/restless 0 0 0 0 -  Suicidal thoughts 0 0 0 0 -  PHQ-9 Score 0 0 0 0 -  Difficult doing work/chores Not difficult at all - Not difficult at all - -     Impression and Recommendations:    1. Anxious mood   2. Insomnia, unspecified type   3. Diabetes mellitus without complication (HCC)   4. Counseling on health promotion and disease prevention   5. Educated About Covid-19 Virus Infection     COVID-19 Counseling - Novel Covid -19 counseling done; all questions were answered.   - Current CDC / federal and [place] guidelines reviewed with patient  - Reminded pt of extreme importance of social distancing; wearing a mask when out in public; insensate handwashing and cleaning of surfaces, avoiding unnecessary trips for shopping and avoiding ALL but emergency appts etc. - Told patient to be prepared, not scared; and be smart for the sake of others - Patient will call with any additional concerns  Anxious Mood - Per patient, "anxious mood due to COVID." - Discussed that patient is not currently on Rx mood management aside from Xanax. - Advised patient that overuse of Xanax can lead to S-E including memory concerns. - Prudent use of Xanax extensively reviewed.  Patient agrees to rarely use-only for panic.  - Discussed beginning additional prescription mood management. - Extensive education provided to patient and all questions were answered.  - Since he is managed on so many other medications, patient declines SSRI medication today such as low-dose Zoloft.   - States he desires to go "another couple of weeks without doing anything.  I don't feel like I need something every day, just something once in a while to take the edge off so I can go to sleep."   -Trazodone prescription given - In addition to prescription intervention, reviewed the "spokes of the wheel" of mood and health management.  Stressed the importance of ongoing prudent habits, including regular exercise,  appropriate sleep hygiene, healthful dietary habits, and prayer/meditation to relax.  - Will continue to monitor.  Insomnia, Unspecified - Discussed beginning trazodone today, only for use PRN.  - Patient agrees to try trazodone today. - Trazodone prescribed.  See med list.  - Prudent sleep hygiene discussed extensively with patient today. - Reviewed adequate sleep goals with patient, 7-8 hours per night. - Encouraged patient to try to avoid napping during the day to help improve routine.  - Will  continue to monitor.  Health Counseling & Preventative Health Maintenance - Advised patient to continue working toward exercising to improve overall mental, physical, and emotional health.  Encouraged patient to engage in daily physical activity , especially a formal exercise routine.  Recommended that the patient eventually strive for at least 150 minutes of moderate cardiovascular activity per week according to guidelines established by the [facility].   - Healthy dietary habits encouraged, including low-carb, and high amounts of lean protein in diet.  American Heart Association guidelines for healthy diet, basically Mediterranean diet, and exercise guidelines of 30 minutes 5 days per week or more discussed in detail.  - Patient should also consume adequate amounts of water.  - Health counseling performed.  All questions answered.  Recommendations - Follow-up in four weeks for progress on trazodone and further evaluation of mood and sleep.  - As part of my medical decision making, I reviewed the following data within the electronic MEDICAL RECORD NUMBER History obtained from pt /family, CMA notes reviewed and incorporated if applicable, Labs reviewed, Radiograph/ tests reviewed if applicable and OV notes from prior OV's with me, as well as other specialists she/he has seen since seeing me last, were all reviewed and used in my medical decision making process today.   - Additionally, discussion had with  patient regarding txmnt plan, their biases about that plan etc were used in my medical decision making today.   - The patient agreed with the plan and demonstrated an understanding of the instructions.   No barriers to understanding were identified.   - Red flag symptoms and signs discussed in detail.  Patient expressed understanding regarding what to do in case of emergency\ urgent symptoms.  The patient was advised to call back or seek an in-person evaluation if the symptoms worsen or if the condition fails to improve as anticipated.   Return for 4wks- f/up after starting trazadone prn sleep and anxiety.    Meds ordered this encounter  Medications  . traZODone (DESYREL) 50 MG tablet    Sig: Take 0.5-1 tablets (25-50 mg total) by mouth at bedtime as needed for sleep.    Dispense:  90 tablet    Refill:  0    I provided 20+ minutes of face-to-face time during this encounter.  Additional time was spent with charting and coordination of care after the actual visit commenced.   Note:  This note was prepared with assistance of Dragon voice recognition software. Occasional wrong-word or sound-a-like substitutions may have occurred due to the inherent limitations of voice recognition software.  This document serves as a record of services personally performed by Thomasene Loteborah Maija Biggers, DO. It was created on her behalf by Peggye FothergillKatherine Galloway, a trained medical scribe. The creation of this record is based on the scribe's personal observations and the provider's statements to them.   I have reviewed the above medical documentation for accuracy and completeness and I concur.  Thomasene Loteborah Atara Paterson, DO 01/17/2019 5:22 PM        Patient Care Team    Relationship Specialty Notifications Start End  Thomasene Lotpalski, Jovanni Eckhart, DO PCP - General Family Medicine  10/16/15     -Vitals obtained; medications/ allergies reconciled;  personal medical, social, Sx etc.histories were updated by CMA, reviewed by me and are reflected  in chart  Patient Active Problem List   Diagnosis Date Noted  . Mixed diabetic hyperlipidemia associated with type 2 diabetes mellitus (HCC) 11/12/2017    Priority: High  . Hypertension associated with diabetes (HCC)  11/12/2017    Priority: High  . Diabetes mellitus without complication ([place]) 77/82/4235    Priority: High  . smoking cessation counseling 11/05/2015    Priority: Medium  . Smoking 10/16/2015    Priority: Medium  . Obese 10/16/2015    Priority: Medium  . OA (osteoarthritis) of hip 11/05/2015    Priority: Low  . Vitamin D deficiency 11/05/2015    Priority: Low  . Anxious mood 01/17/2019  . Insomnia 01/17/2019  . Hearing loss of left ear due to cerumen impaction 09/14/2018     Current Meds  Medication Sig  . ALPRAZolam (XANAX) 0.5 MG tablet take 1 tablet by mouth if needed for anxiety  . aspirin 81 MG tablet Take 81 mg by mouth daily.  . BD PEN NEEDLE NANO U/F 32G X 4 MM MISC USE TWICE DAILY  . INVOKAMET [phone] MG TABS TAKE 2 TABLETS BY MOUTH DAILY  . Lancets (ONETOUCH ULTRASOFT) lancets Use to test blood sugar 2 times daily- Dx code E11.9  . lisinopril (ZESTRIL) 5 MG tablet Take 1 tablet (5 mg total) by mouth daily.  Marland Kitchen NOVOLOG FLEXPEN 100 UNIT/ML FlexPen INJECT 8-10 UNITS SUBCUTANEOUS BEFORE MAIN MEAL  . ONETOUCH VERIO test strip TEST TWICE DAILY  . simvastatin (ZOCOR) 40 MG tablet Take 1 tablet (40 mg total) by mouth daily.  . TRESIBA FLEXTOUCH 200 UNIT/ML SOPN INJECT 38 UNITS SUBCUTANEOUS EVERY DAY  . Vitamin D, Ergocalciferol, (DRISDOL) 50000 units CAPS capsule Take 1 capsule (50,000 Units total) by mouth every 7 (seven) days.     No Known Allergies   ROS:  See above HPI for pertinent positives and negatives   Objective:   Blood pressure 126/82, pulse 92, temperature (!) 97.3 F (36.3 C), height 5' 10.5" (1.791 m), weight 208 lb (94.3 kg).  (if some vitals are omitted, this means that patient was UNABLE to obtain them even though they were asked to  get them prior to OV today.  They were asked to call us at their earliest convenience with these once obtained.)  General: A & O * 3; visually in no acute distress; in usual state of health.  Skin: Visible skin appears normal and pt's usual skin color HEENT:  EOMI, head is normocephalic and atraumatic.  Sclera are anicteric. Neck has a good range of motion.  Lips are noncyanotic Chest: normal chest excursion and movement Respiratory: speaking in full sentences, no conversational dyspnea; no use of accessory muscles Psych: insight good, mood- appears full

## 2019-02-22 ENCOUNTER — Other Ambulatory Visit: Payer: Self-pay | Admitting: Endocrinology

## 2019-02-27 ENCOUNTER — Other Ambulatory Visit: Payer: Self-pay | Admitting: Endocrinology

## 2019-03-02 ENCOUNTER — Other Ambulatory Visit: Payer: Self-pay | Admitting: Endocrinology

## 2019-03-04 ENCOUNTER — Other Ambulatory Visit: Payer: Self-pay | Admitting: Family Medicine

## 2019-03-04 DIAGNOSIS — E781 Pure hyperglyceridemia: Secondary | ICD-10-CM

## 2019-03-06 ENCOUNTER — Other Ambulatory Visit: Payer: Self-pay | Admitting: Family Medicine

## 2019-03-06 ENCOUNTER — Telehealth: Payer: Self-pay

## 2019-03-06 DIAGNOSIS — E781 Pure hyperglyceridemia: Secondary | ICD-10-CM

## 2019-03-06 NOTE — Telephone Encounter (Signed)
Please call pt to schedule f/u.  He was to have returned 02/16/2019.  No further refills until pt is seen.  Charyl Bigger, CMA

## 2019-03-23 ENCOUNTER — Ambulatory Visit: Payer: PPO | Admitting: Endocrinology

## 2019-04-02 ENCOUNTER — Other Ambulatory Visit: Payer: Self-pay | Admitting: Family Medicine

## 2019-04-02 DIAGNOSIS — E781 Pure hyperglyceridemia: Secondary | ICD-10-CM

## 2019-04-11 ENCOUNTER — Other Ambulatory Visit: Payer: PPO

## 2019-04-11 ENCOUNTER — Other Ambulatory Visit: Payer: Self-pay

## 2019-04-11 DIAGNOSIS — E1129 Type 2 diabetes mellitus with other diabetic kidney complication: Secondary | ICD-10-CM

## 2019-04-11 DIAGNOSIS — E1165 Type 2 diabetes mellitus with hyperglycemia: Secondary | ICD-10-CM | POA: Diagnosis not present

## 2019-04-11 DIAGNOSIS — R809 Proteinuria, unspecified: Secondary | ICD-10-CM | POA: Diagnosis not present

## 2019-04-11 DIAGNOSIS — Z794 Long term (current) use of insulin: Secondary | ICD-10-CM | POA: Diagnosis not present

## 2019-04-12 LAB — BASIC METABOLIC PANEL
BUN/Creatinine Ratio: 23 (ref 10–24)
BUN: 17 mg/dL (ref 8–27)
CO2: 23 mmol/L (ref 20–29)
Calcium: 9.2 mg/dL (ref 8.6–10.2)
Chloride: 98 mmol/L (ref 96–106)
Creatinine, Ser: 0.75 mg/dL — ABNORMAL LOW (ref 0.76–1.27)
GFR calc Af Amer: 107 mL/min/{1.73_m2} (ref >59)
GFR calc non Af Amer: 93 mL/min/{1.73_m2} (ref >59)
Glucose: 126 mg/dL — ABNORMAL HIGH (ref 65–99)
Potassium: 4.4 mmol/L (ref 3.5–5.2)
Sodium: 134 mmol/L (ref 134–144)

## 2019-04-12 LAB — HEMOGLOBIN A1C
Est. average glucose Bld gHb Est-mCnc: 166 mg/dL
Hgb A1c MFr Bld: 7.4 % — ABNORMAL HIGH (ref 4.8–5.6)

## 2019-04-12 LAB — MICROALBUMIN / CREATININE URINE RATIO
Creatinine, Urine: 62.2 mg/dL
Microalb/Creat Ratio: 132 mg/g creat — ABNORMAL HIGH (ref 0–29)
Microalbumin, Urine: 81.8 ug/mL

## 2019-04-16 ENCOUNTER — Other Ambulatory Visit: Payer: Self-pay | Admitting: Family Medicine

## 2019-04-16 DIAGNOSIS — G4700 Insomnia, unspecified: Secondary | ICD-10-CM

## 2019-04-16 DIAGNOSIS — E781 Pure hyperglyceridemia: Secondary | ICD-10-CM

## 2019-04-19 ENCOUNTER — Other Ambulatory Visit: Payer: Self-pay | Admitting: Endocrinology

## 2019-04-20 ENCOUNTER — Other Ambulatory Visit: Payer: Self-pay

## 2019-04-20 ENCOUNTER — Ambulatory Visit (INDEPENDENT_AMBULATORY_CARE_PROVIDER_SITE_OTHER): Payer: PPO | Admitting: Endocrinology

## 2019-04-20 ENCOUNTER — Encounter: Payer: Self-pay | Admitting: Endocrinology

## 2019-04-20 DIAGNOSIS — R809 Proteinuria, unspecified: Secondary | ICD-10-CM

## 2019-04-20 DIAGNOSIS — E1129 Type 2 diabetes mellitus with other diabetic kidney complication: Secondary | ICD-10-CM

## 2019-04-20 DIAGNOSIS — E1165 Type 2 diabetes mellitus with hyperglycemia: Secondary | ICD-10-CM | POA: Diagnosis not present

## 2019-04-20 DIAGNOSIS — Z794 Long term (current) use of insulin: Secondary | ICD-10-CM | POA: Diagnosis not present

## 2019-04-20 NOTE — Progress Notes (Signed)
Patient ID: Jose Munoz, male   DOB: Nov 03, 1950, 68 y.o.   MRN: [id number]           Reason for Appointment:  Follow-up for Type 2 Diabetes  Referring physician: Opalski   I connected with the above-named patient by video enabled telemedicine application and verified that I am speaking with the correct person. The patient was explained the limitations of evaluation and management by telemedicine and the availability of in person appointments.  Patient also understood that there may be a patient responsible charge related to this service . Location of the patient: Patient's home . Location of the provider: Physician office Only the patient and myself were participating in the encounter The patient understood the above statements and agreed to proceed.   History of Present Illness:          Date of diagnosis of type 2 diabetes mellitus: 1998       Background history:   He thinks his diabetes was relatively easier to control and the onset even though he had initial symptoms of thirst and weight loss He had done very well with his diet and lost a significant amount of weight and was approved to control his diabetes without medications for a few years Subsequently he started with oral medications including metformin, Actos and Amaryl He thinks his blood sugars were well controlled for a few years initially but started getting higher progressively since 2016. Because of poor control he was started on basal bolus insulin regimen on his initial consultation, highest A1c was 11.9 in 01/2016  Recent history:    INSULIN regimen: TRESIBA 38  units daily at bedtime, Novolog 10- 12 units at dinner  Non-insulin hypoglycemic drugs the patient is taking are:Invokamet 150/1000 twice a day  Current management, blood sugar patterns and problems identified:  His A1c is back up to 7.4 from 7.2  Despite reminders he forgets to check his sugars after his meals especially after lunch and dinner   He says that he does not want to stick himself more than he is currently doing  However his fasting blood sugars are going up as much as before  Most likely he has some high readings after meals including breakfast which he does not check regularly  Normally does have some protein at breakfast but his blood sugar was 191 last week from a larger meal  Also occasionally at lunch may eat a sandwich and not cover this with mealtime insulin  Difficult to adjust his suppertime dose since he has very little monitoring after dinner although he thinks blood sugar may be about 160-170 when he checks them  No hypoglycemic symptoms present       Side effects from medications have been: None  Compliance with the medical regimen: Fair  Glucose monitoring:  done less than 1 times a day         Glucometer:   One Touch Verio  Blood sugars by patient history:   PRE-MEAL Fasting Lunch Dinner Bedtime Overall  Glucose range:  119-134  107, 133     Mean/median:      132   POST-MEAL PC Breakfast PC Lunch PC Dinner  Glucose range:  191  ?  160-170  Mean/median:      Previous readings  PRE-MEAL Fasting Lunch Dinner Bedtime Overall  Glucose range:  123-160   ?  120-130   Mean/median:      147     Self-care: The diet that the patient has been following  is: tries to limit sweets and drinks with sugar .      Typical meal intake: Breakfast is egg, toast occasionally, sometimes skipped or; Lunch is a sandwich, has various snacks    Dinner 5-7 P.m.            Dietician visit, most recent: 06/2016 CDE visit: 11/17              Weight history:  Wt Readings from Last 3 Encounters:  01/17/19 208 lb (94.3 kg)  09/14/18 212 lb 12.8 oz (96.5 kg)  06/07/18 211 lb 3.2 oz (95.8 kg)    Glycemic control:   Lab Results  Component Value Date   HGBA1C 7.4 (H) 04/11/2019   HGBA1C 7.2 (H) 12/13/2018   HGBA1C 7.4 (H) 09/01/2018   Lab Results  Component Value Date   MICROALBUR 4.3 (H) 08/11/2016    LDLCALC 53 09/01/2018   CREATININE 0.75 (L) 04/11/2019   Lab Results  Component Value Date   MICRALBCREAT 132 (H) 04/11/2019       Allergies as of 04/20/2019   No Known Allergies     Medication List       Accurate as of April 20, 2019  1:09 PM. If you have any questions, ask your nurse or doctor.        ALPRAZolam 0.5 MG tablet Commonly known as: XANAX take 1 tablet by mouth if needed for anxiety   aspirin 81 MG tablet Take 81 mg by mouth daily.   BD Pen Needle Nano 2nd Gen 32G X 4 MM Misc Generic drug: Insulin Pen Needle USE TWICE DAILY   Invokamet [id number] MG Tabs Generic drug: Canagliflozin-metFORMIN HCl TAKE 2 TABLETS BY MOUTH DAILY   lisinopril 5 MG tablet Commonly known as: ZESTRIL Take 1 tablet (5 mg total) by mouth daily.   NovoLOG FlexPen 100 UNIT/ML FlexPen Generic drug: insulin aspart INJECT 8-10 UNITS SUBCUTANEOUS BEFORE MAIN MEAL   onetouch ultrasoft lancets Use to test blood sugar 2 times daily- Dx code E11.9   OneTouch Verio test strip Generic drug: glucose blood TEST TWICE DAILY   simvastatin 40 MG tablet Commonly known as: ZOCOR TAKE 1 TABLET BY MOUTH EVERY DAY(OFFICE VISIT REQUIRED FOR FURTHER REFILLS)   traZODone 50 MG tablet Commonly known as: DESYREL Take 1 tablet (50 mg total) by mouth at bedtime as needed for sleep. **PATIENT NEEDS APT FOR FURTHER REFILLS**   Tresiba FlexTouch 200 UNIT/ML Sopn Generic drug: Insulin Degludec ADMINISTER 38 UNITS UNDER THE SKIN EVERY DAY What changed: See the new instructions.   Vitamin D (Ergocalciferol) 1.25 MG (50000 UT) Caps capsule Commonly known as: DRISDOL Take 1 capsule (50,000 Units total) by mouth every 7 (seven) days.       Allergies: No Known Allergies  Past Medical History:  Diagnosis Date  . Diabetes mellitus without complication (HCC)   . Essential hypertension 10/16/2015  . Hyperlipidemia   . OA (osteoarthritis) of hip 11/05/2015  . Obese 10/16/2015  . Smoking   .  Vitamin D deficiency 11/05/2015    History reviewed. No pertinent surgical history.  Family History  Problem Relation Age of Onset  . Diabetes Mother   . Congestive Heart Failure Mother   . Cancer Sister        breast  . Cancer Sister        cervical  . Heart attack Brother     Social History:  reports that he has been smoking cigarettes. He has been smoking about 1.00 pack per day.  He has never used smokeless tobacco. He reports current alcohol use of about 3.0 standard drinks of alcohol per week. He reports that he does not use drugs.   Review of Systems   Lipid history: Has good LDL levels with taking simvastatin 40 mg prescribed by his PCP Also triglycerides are normal    Lab Results  Component Value Date   CHOL 113 09/01/2018   HDL 36 (L) 09/01/2018   LDLCALC 53 09/01/2018   TRIG 118 09/01/2018   CHOLHDL 3.1 09/01/2018           Hypertension:  is being controlled with lisinopril 5 mg  Also on Invokana  Blood pressure recently at home 132/82  BP Readings from Last 3 Encounters:  01/17/19 126/82  09/14/18 128/70  06/07/18 130/68     Has eye exams regularly, last done on 04/05/2018   Most recent foot exam: 05/2018    LABS:  No visits with results within 1 Week(s) from this visit.  Latest known visit with results is:  Lab on 04/11/2019  Component Date Value Ref Range Status  . Creatinine, Urine 04/11/2019 62.2  Not Estab. mg/dL Final  . Microalbumin, Urine 04/11/2019 81.8  Not Estab. ug/mL Final  . Microalb/Creat Ratio 04/11/2019 132* 0 - 29 mg/g creat Final   Comment:                        Normal:                0 -  29                        Moderately increased: 30 - 300                        Severely increased:       >300   . Glucose 04/11/2019 126* 65 - 99 mg/dL Final  . BUN 84/66/5993 17  8 - 27 mg/dL Final  . Creatinine, Ser 04/11/2019 0.75* 0.76 - 1.27 mg/dL Final  . GFR calc non Af Amer 04/11/2019 93  >59 mL/min/1.73 Final  . GFR calc  Af Amer 04/11/2019 107  >59 mL/min/1.73 Final  . BUN/Creatinine Ratio 04/11/2019 23  10 - 24 Final  . Sodium 04/11/2019 134  134 - 144 mmol/L Final  . Potassium 04/11/2019 4.4  3.5 - 5.2 mmol/L Final  . Chloride 04/11/2019 98  96 - 106 mmol/L Final  . CO2 04/11/2019 23  20 - 29 mmol/L Final  . Calcium 04/11/2019 9.2  8.6 - 10.2 mg/dL Final  . Hgb T7S MFr Bld 04/11/2019 7.4* 4.8 - 5.6 % Final   Comment:          Prediabetes: 5.7 - 6.4          Diabetes: >6.4          Glycemic control for adults with diabetes: <7.0   . Est. average glucose Bld gHb Est-m* 04/11/2019 166  mg/dL Final    Physical Examination:  There were no vitals taken for this visit.   ASSESSMENT:  Diabetes type 2, insulin-dependent  See history of present illness for detailed discussion of current diabetes management, blood sugar patterns and problems identified  His A1c is 7.4, previously 7.2   He is on Invokamet and basal bolus insulin with NovoLog at dinner only  His A1c is a little higher and discussed with him that this is still  over 7% Although his fasting readings are slightly higher he likely has more significant postprandial hyperglycemia He is not monitoring his readings after meals despite reminders because he does not like to stick himself more Also recently not exercising because of knee pain  MICROALBUMINURIA: His blood pressure is high normal at home and he is only on 5 mg lisinopril with continued microalbuminuria  PLAN:    He will rotate the times of his monitoring and start checking readings after one of the meals by rotation including suppertime  If he is having significant amount of carbohydrate at breakfast he will need to take 8 to 10 units to cover this  Also may need to take more insulin at suppertime if his postprandial readings are consistently over 160-170  No change in [place]  Restart exercise when able to  He can also try doing upper body exercises until his knee pain is  better  Recheck A1c in 3 months  For his hypertension and increased microalbumin he will go back up to 10 mg of lisinopril and discuss with PCP also  There are no Patient Instructions on file for this visit.     Reather LittlerAjay Ndeye Tenorio 04/20/2019, 1:09 PM   Note: This office note was prepared with Dragon voice recognition system technology. Any transcriptional errors that result from this process are unintentional.

## 2019-04-30 ENCOUNTER — Other Ambulatory Visit: Payer: Self-pay | Admitting: Family Medicine

## 2019-04-30 DIAGNOSIS — E781 Pure hyperglyceridemia: Secondary | ICD-10-CM

## 2019-04-30 DIAGNOSIS — G4700 Insomnia, unspecified: Secondary | ICD-10-CM

## 2019-05-01 ENCOUNTER — Telehealth: Payer: Self-pay | Admitting: Family Medicine

## 2019-05-01 NOTE — Telephone Encounter (Signed)
Can you please contact patient and advise that his refill request has been denied. He has been given the 30 day and the 15 day per the refill policy but now has to have apt for further refills. AS, CMA

## 2019-05-01 NOTE — Telephone Encounter (Signed)
Original message meant for front desk. AS, CMA

## 2019-05-01 NOTE — Telephone Encounter (Signed)
I am sorry but I do not see per your note what medication he was asking for.  I also do not see a recent medication refill request in his chart to [place].  If need be, please clarify otherwise, see below.  And if it is for the trazodone, this was started back in September and he was told to follow-up in 4 weeks as it was a brand-new medicine and I need to assess how he is doing on it.   If this was for trazodone that you are speaking about, he needs to be seen and evaluated how he is doing on this new medicine before I will refill it..  There is nothing additional ongoing to be able to communicate to him.

## 2019-05-03 ENCOUNTER — Ambulatory Visit (INDEPENDENT_AMBULATORY_CARE_PROVIDER_SITE_OTHER): Payer: PPO | Admitting: Family Medicine

## 2019-05-03 ENCOUNTER — Other Ambulatory Visit: Payer: Self-pay

## 2019-05-03 ENCOUNTER — Encounter: Payer: Self-pay | Admitting: Family Medicine

## 2019-05-03 VITALS — BP 128/78 | Ht 71.0 in | Wt 212.0 lb

## 2019-05-03 DIAGNOSIS — F17200 Nicotine dependence, unspecified, uncomplicated: Secondary | ICD-10-CM | POA: Diagnosis not present

## 2019-05-03 DIAGNOSIS — R809 Proteinuria, unspecified: Secondary | ICD-10-CM

## 2019-05-03 DIAGNOSIS — E1159 Type 2 diabetes mellitus with other circulatory complications: Secondary | ICD-10-CM

## 2019-05-03 DIAGNOSIS — E1129 Type 2 diabetes mellitus with other diabetic kidney complication: Secondary | ICD-10-CM | POA: Diagnosis not present

## 2019-05-03 DIAGNOSIS — E663 Overweight: Secondary | ICD-10-CM | POA: Diagnosis not present

## 2019-05-03 DIAGNOSIS — E559 Vitamin D deficiency, unspecified: Secondary | ICD-10-CM | POA: Diagnosis not present

## 2019-05-03 DIAGNOSIS — E1169 Type 2 diabetes mellitus with other specified complication: Secondary | ICD-10-CM

## 2019-05-03 DIAGNOSIS — F419 Anxiety disorder, unspecified: Secondary | ICD-10-CM | POA: Diagnosis not present

## 2019-05-03 DIAGNOSIS — Z716 Tobacco abuse counseling: Secondary | ICD-10-CM

## 2019-05-03 DIAGNOSIS — E781 Pure hyperglyceridemia: Secondary | ICD-10-CM | POA: Diagnosis not present

## 2019-05-03 DIAGNOSIS — E119 Type 2 diabetes mellitus without complications: Secondary | ICD-10-CM

## 2019-05-03 DIAGNOSIS — I10 Essential (primary) hypertension: Secondary | ICD-10-CM

## 2019-05-03 DIAGNOSIS — E782 Mixed hyperlipidemia: Secondary | ICD-10-CM | POA: Diagnosis not present

## 2019-05-03 DIAGNOSIS — G4700 Insomnia, unspecified: Secondary | ICD-10-CM

## 2019-05-03 MED ORDER — SIMVASTATIN 40 MG PO TABS: ORAL_TABLET | ORAL | 1 refills | Status: DC

## 2019-05-03 MED ORDER — ALPRAZOLAM 0.5 MG PO TABS: ORAL_TABLET | ORAL | 0 refills | Status: DC

## 2019-05-03 MED ORDER — LISINOPRIL 10 MG PO TABS: 10.0000 mg | ORAL_TABLET | Freq: Every day | ORAL | 1 refills | Status: DC

## 2019-05-03 MED ORDER — VITAMIN D (ERGOCALCIFEROL) 1.25 MG (50000 UNIT) PO CAPS: 50000.0000 [IU] | ORAL_CAPSULE | ORAL | 4 refills | Status: DC

## 2019-05-03 NOTE — Progress Notes (Signed)
Virtual / live video office visit note for [employer], D.O- Primary Care Physician at [facility]   I connected with current patient today and beyond visually recognizing the correct individual, I verified that I am speaking with the correct person using two identifiers.  . Location of the patient: Home . Location of the provider: Office Only the patient (+/- their family members at pt's discretion) and myself were participating in the encounter    - This visit type was conducted due to national recommendations for restrictions regarding the COVID-19 Pandemic (e.g. social distancing) in an effort to limit this patient's exposure and mitigate transmission in our community.  This format is felt to be most appropriate for this patient at this time.   - The patient did have access to video technology today  - No physical exam could be performed with this format, beyond that communicated to [place] by the patient/ family members as noted.   - Additionally my office staff/ schedulers discussed with the patient that there may be a monetary charge related to this service, depending on patient's medical insurance.   The patient expressed understanding, and agreed to proceed.      History of Present Illness:  I, Peggye Fothergill, am serving as scribe for Dr. Thomasene Lot.   - General Health In terms of his health, he thinks that "for a 68 year old man, I'm doing good, I'm an 8/10."  Notes he had some trouble today working the app for a video visit, had to convert to telehealth b/c of it.  Overall says "I'm doing pretty good and feel good."  Notes "when you're 68 years old in a year like this, you feel like you've wasted a year of your life."  States he didn't take medicine for fifty years of his life, until he was diagnosed with diabetes.   - Current Smoker Says he tries to monitor how much he smokes but he has no wish to quit at this time.   - Anxious Mood & Insomnia Says he's  been okay considering the current events / circumstances.  "Like most people, I'm just not doing much."  To be safe, he's only seen his kids and grandkids twice over the last 7-8 months.  "About the only time I leave the house is to go up to the grocery store, and that's it."  Says he knows everyone is dealing with the same thing and tries not to let it worry him too much.  "I guess it hasn't been any tougher [on me] than what anyone else is dealing with."  Says he tries not to look at himself as a victim or as being "punished more than anyone else," because others are suffering more.  Denies suicidal ideations.  Says he tried the trazodone to aid with sleep, but felt drowsy the next morning and "almost like I had never slept."  The trazodone helped him get to sleep, but he felt "not normal" the next morning.   He has only ever tried a half tablet of trazodone.  Notes he has used the half-tablet of alprazolam to better success to sleep.  Notes he doesn't have trouble going to sleep very often; "it happens once in a while."  Says usually he just wakes up and goes to watch TV until he falls back to sleep.    HPI:   Diabetes Mellitus:  Home glucose readings:  120-130 in the morning.  "They're never real fasting sugars  either, doc, because I just can't do it; I have to have a couple of cups of coffee with some half and half when I wake up, and then I check it."  Continues to follow up with Dr. Lucianne Muss.   - Patient reports good compliance with therapy plan: medication and/or lifestyle modification  - His denies acute concerns or problems related to treatment plan  - He denies new concerns.  Denies polyuria/polydipsia, hypo/ hyperglycemia symptoms.  Denies new onset of: chest pain, exercise intolerance, shortness of breath, dizziness, visual changes, headache, lower extremity swelling or claudication.    Last A1C in the office was:  Lab Results  Component Value Date   HGBA1C 7.4 (H) 04/11/2019    HGBA1C 7.2 (H) 12/13/2018   HGBA1C 7.4 (H) 09/01/2018   Lab Results  Component Value Date   MICROALBUR 4.3 (H) 08/11/2016   LDLCALC 53 09/01/2018   CREATININE 0.75 (L) 04/11/2019   BP Readings from Last 3 Encounters:  05/03/19 128/78  01/17/19 126/82  09/14/18 128/70   Wt Readings from Last 3 Encounters:  05/03/19 212 lb (96.2 kg)  01/17/19 208 lb (94.3 kg)  09/14/18 212 lb 12.8 oz (96.5 kg)     HPI:  Hypertension:  -  His blood pressure at home has been running: He's been checking at last once per week, and states that even on the 5 mg lisinopril, his BP was normally running 135/82; "I never really felt like it was abnormal."  Per patient, Dr. Lucianne Muss recently told the patient that his protein was high, and patient was told to take 10 mg of lisinopril rather than 5 mg.  Notes he started taking 10 mg after talking to Dr. Lucianne Muss and noticed his BP went down, to about 128/78.  Dr. Lucianne Muss "suggested the 10 because he said it would help the kidney function."  - Patient reports good compliance with medication and/or lifestyle modification  - His denies acute concerns or problems related to treatment plan  - He denies new onset of: chest pain, exercise intolerance, shortness of breath, dizziness, visual changes, headache, lower extremity swelling or claudication.   Last 3 blood pressure readings in our office are as follows: BP Readings from Last 3 Encounters:  05/03/19 128/78  01/17/19 126/82  09/14/18 128/70   Filed Weights   05/03/19 0803  Weight: 212 lb (96.2 kg)       Depression screen [facility] 2/9 05/03/2019 03/10/2018 11/12/2017 10/25/2017 05/17/2017  Decreased Interest 0 0 0 0 0  Down, Depressed, Hopeless 0 0 0 0 0  PHQ - 2 Score 0 0 0 0 0  Altered sleeping 1 0 0 0 0  Tired, decreased energy 0 0 0 0 0  Change in appetite 0 0 0 0 0  Feeling bad or failure about yourself  0 0 0 0 0  Trouble concentrating 0 0 0 0 0  Moving slowly or fidgety/restless 0 0 0 0 0  Suicidal  thoughts 0 0 0 0 0  PHQ-9 Score 1 0 0 0 0  Difficult doing work/chores Not difficult at all Not difficult at all - Not difficult at all -    No flowsheet data found.   Impression and Recommendations:    1. Diabetes mellitus without complication (HCC)   2. Hypertension associated with diabetes (HCC)   3. Microalbuminuria due to type 2 diabetes mellitus (HCC)   4. Mixed diabetic hyperlipidemia associated with type 2 diabetes mellitus (HCC)   5. Type 2 diabetes mellitus with  hypertriglyceridemia (HCC)   6. Vitamin D deficiency   7. Insomnia, unspecified type   8. Overweight (BMI 25.0-29.9)   9. Anxious mood   10. Smoking   11. smoking cessation counseling     Anxious Mood, Insomnia - I reviewed the PDMP and the last refill of Xanax was given on 08/02/2018 for 30 tablets.  - Per patient, overall stable at this time. - If trazodone is not working for the patient as intended, told him to discontinue taking it.  - Reviewed importance of taking Xanax prudently/sparingly as prescribed.  - Will continue to monitor.   Diabetes Mellitus - A1c most recently was 7.4, up slightly from 7.2 four months ago.  - Pt will continue current treatment regimen.  See med list.  - Told patient to ask Dr. Lucianne Muss regarding his eligibility for a touchless BS monitor  - Counseled patient on pathophysiology of disease and discussed various treatment options, which always includes dietary and lifestyle modification as first line.    - Importance of low carb, heart-healthy diet discussed with patient in addition to regular aerobic exercise of 5d/week or more.   - Check FBS and 2 hours after the biggest meal of your day.  Keep log and bring in next OV for my review.     - Also told patient if you ever feel poorly, please check your blood pressure and blood sugar, as one or the other could be the cause of your symptoms.  - Pt reminded about need for yearly eye and foot exams.  Told patient to make  appt.for diabetic eye exam, CMAs here will do foot exams  - Handouts provided at patient's desire and or told to go online at the American Diabetes Association website for further information  - We will continue to monitor.   Microalbuminuria due to Type 2 DM - 10 of 2019, measured at 171.  Discussed goal of less than 30. - End of November 2020, measurement down to 132.  - Discussed that on 04/11/2019, patient's kidney function looked good, with serum creatinine measured at 0.75.  - To help improve and preserve kidney health, discussed importance of weight loss, adequate hydration, increased physical activity, better control of blood sugar, and close management of blood pressure.  - Reviewed that certain medications will aid with kidney function in diabetic patients. - Extensive education provided to patient today and all questions answered.  - Will continue to monitor and re-check six months from last check as discussed.   Hypertension associated with DM - Discussed goal BP of 135/85 or less, definitely under 140/90. - Blood pressure currently is at goal.  - Dr. Lucianne Muss recently adjusted pt treatment to include 10 mg lisinopril. Bp under good control - Patient will continue current treatment regimen.  See med list.  - Counseled patient on pathophysiology of disease and discussed various treatment options, which always includes dietary and lifestyle modification as first line.   - Lifestyle changes such as dash and heart healthy diets and engaging in a regular exercise program discussed extensively with patient.   - Ambulatory blood pressure monitoring encouraged at least 3 times weekly.  Keep log and bring in every office visit.  Reminded patient that if they ever feel poorly in any way, to check their blood pressure and pulse.  - Handouts provided at patient's desire and/or told to go online at the American Heart Association website for further information  - We will continue to  monitor.   Hyperlipidemia associated  with DM - Lipid panel at goal last check.  - Pt will continue current treatment regimen.  See med list.  Dietary changes such as low saturated & trans fat diets for hyperlipidemia and low carb/ ketogenic diets for hypertriglyceridemia discussed with patient.    Encouraged patient to follow AHA guidelines for regular exercise and also engage in weight loss if BMI above 25.   We will continue to monitor   Vitamin D Deficiency - Continue supplementation as prescribed. - Will continue to monitor and re-check as discussed.   Smoking Cessation - Patient with no desire to quit smoking at this time. - counseling done of options to help him quit - Will continue to monitor.   BMI Counseling - Body mass index is 29.57 kg/m; Overweight Explained to patient what BMI refers to, and what it means medically.    Told patient to think about it as a "medical risk stratification measurement" and how increasing BMI is associated with increasing risk/ or worsening state of various diseases such as hypertension, hyperlipidemia, diabetes, premature OA, depression etc.  American Heart Association guidelines for healthy diet, basically Mediterranean diet, and exercise guidelines of 30 minutes 5 days per week or more discussed in detail.  Health counseling performed.  All questions answered.   Recommendations - Discussed that last full labs were drawn 6 of 2019. - Need for Medicare Wellness and full fasting blood work near future as discussed.   Patient was consulted for 27 minutes   - As part of my medical decision making, I reviewed the following data within the electronic MEDICAL RECORD NUMBER History obtained from pt /family, CMA notes reviewed and incorporated if applicable, Labs reviewed, Radiograph/ tests reviewed if applicable and OV notes from prior OV's with me, as well as other specialists she/he has seen since seeing me last, were all reviewed and used in my  medical decision making process today.   - Additionally, discussion had with patient regarding txmnt plan, their biases about that plan etc were used in my medical decision making today.   - The patient agreed with the plan and demonstrated an understanding of the instructions.   No barriers to understanding were identified.    Return for Medicare Wellness and full fasting lab work January.    No orders of the defined types were placed in this encounter.   Meds ordered this encounter  Medications  . ALPRAZolam (XANAX) 0.5 MG tablet    Sig: take 1 tablet by mouth if needed for anxiety    Dispense:  30 tablet    Refill:  0  . simvastatin (ZOCOR) 40 MG tablet    Sig: 1 po q hs    Dispense:  90 tablet    Refill:  1  . lisinopril (ZESTRIL) 10 MG tablet    Sig: Take 1 tablet (10 mg total) by mouth daily.    Dispense:  90 tablet    Refill:  1  . Vitamin D, Ergocalciferol, (DRISDOL) 1.25 MG (50000 UT) CAPS capsule    Sig: Take 1 capsule (50,000 Units total) by mouth every 7 (seven) days.    Dispense:  12 capsule    Refill:  4    Medications Discontinued During This Encounter  Medication Reason  . ALPRAZolam (XANAX) 0.5 MG tablet Error  . Vitamin D, Ergocalciferol, (DRISDOL) 50000 units CAPS capsule Reorder  . lisinopril (ZESTRIL) 5 MG tablet Reorder  . simvastatin (ZOCOR) 40 MG tablet Reorder      Note:  This  note was prepared with assistance of Systems analyst. Occasional wrong-word or sound-a-like substitutions may have occurred due to the inherent limitations of voice recognition software.  This document serves as a record of services personally performed by Mellody Dance, DO. It was created on her behalf by Toni Amend, a trained medical scribe. The creation of this record is based on the scribe's personal observations and the provider's statements to them.   This case required medical decision making of at least moderate complexity. The above  documentation has been reviewed to be accurate and was completed by Marjory Sneddon, D.O.      Patient Care Team    Relationship Specialty Notifications Start End  Mellody Dance, DO PCP - General Family Medicine  10/16/15     -Vitals obtained; medications/ allergies reconciled;  personal medical, social, Sx etc.histories were updated by CMA, reviewed by me and are reflected in chart  Patient Active Problem List   Diagnosis Date Noted  . Mixed diabetic hyperlipidemia associated with type 2 diabetes mellitus ([place]) 11/12/2017  . Hypertension associated with diabetes ([place]) 11/12/2017  . Diabetes mellitus without complication ([place]) 81/44/8185  . smoking cessation counseling 11/05/2015  . Smoking 10/16/2015  . Obese 10/16/2015  . OA (osteoarthritis) of hip 11/05/2015  . Vitamin D deficiency 11/05/2015  . Microalbuminuria due to type 2 diabetes mellitus ([place]) 05/03/2019  . Hypertriglyceridemia 05/03/2019  . Overweight (BMI 25.0-29.9) 05/03/2019  . Anxious mood 01/17/2019  . Insomnia 01/17/2019  . Hearing loss of left ear due to cerumen impaction 09/14/2018     Current Meds  Medication Sig  . aspirin 81 MG tablet Take 81 mg by mouth daily.  . BD PEN NEEDLE NANO 2ND GEN 32G X 4 MM MISC USE TWICE DAILY  . INVOKAMET [phone] MG TABS TAKE 2 TABLETS BY MOUTH DAILY  . Lancets (ONETOUCH ULTRASOFT) lancets Use to test blood sugar 2 times daily- Dx code E11.9  . lisinopril (ZESTRIL) 10 MG tablet Take 1 tablet (10 mg total) by mouth daily.  Marland Kitchen NOVOLOG FLEXPEN 100 UNIT/ML FlexPen INJECT 8-10 UNITS SUBCUTANEOUS BEFORE MAIN MEAL  . ONETOUCH VERIO test strip TEST TWICE DAILY  . simvastatin (ZOCOR) 40 MG tablet 1 po q hs  . TRESIBA FLEXTOUCH 200 UNIT/ML SOPN ADMINISTER 38 UNITS UNDER THE SKIN EVERY DAY (Patient taking differently: Inject 40 Units into the skin daily. )  . Vitamin D, Ergocalciferol, (DRISDOL) 1.25 MG (50000 UT) CAPS capsule Take 1 capsule (50,000 Units total) by mouth every 7  (seven) days.  . [DISCONTINUED] lisinopril (ZESTRIL) 5 MG tablet Take 1 tablet (5 mg total) by mouth daily. (Patient taking differently: Take 10 mg by mouth daily. )  . [DISCONTINUED] simvastatin (ZOCOR) 40 MG tablet TAKE 1 TABLET BY MOUTH EVERY DAY(OFFICE VISIT REQUIRED FOR FURTHER REFILLS)  . [DISCONTINUED] Vitamin D, Ergocalciferol, (DRISDOL) 50000 units CAPS capsule Take 1 capsule (50,000 Units total) by mouth every 7 (seven) days.     No Known Allergies   ROS:  See above HPI for pertinent positives and negatives   Objective:   Blood pressure 128/78, height 5\' 11"  (1.803 m), weight 212 lb (96.2 kg).  (if some vitals are omitted, this means that patient was UNABLE to obtain them even though they were asked to get them prior to OV today.  They were asked to call us at their earliest convenience with these once obtained.)  General: A & O * 3; visually in no acute distress; in usual state of health.  Skin: Visible skin appears normal and pt's usual skin color HEENT:  EOMI, head is normocephalic and atraumatic.  Sclera are anicteric. Neck has a good range of motion.  Lips are noncyanotic Chest: normal chest excursion and movement Respiratory: speaking in full sentences, no conversational dyspnea; no use of accessory muscles Psych: insight good, mood- appears full

## 2019-05-08 ENCOUNTER — Encounter: Payer: Self-pay | Admitting: Family Medicine

## 2019-05-08 ENCOUNTER — Ambulatory Visit (INDEPENDENT_AMBULATORY_CARE_PROVIDER_SITE_OTHER): Payer: PPO | Admitting: Family Medicine

## 2019-05-08 VITALS — BP 122/76 | HR 85

## 2019-05-08 DIAGNOSIS — R1084 Generalized abdominal pain: Secondary | ICD-10-CM

## 2019-05-08 DIAGNOSIS — Z1211 Encounter for screening for malignant neoplasm of colon: Secondary | ICD-10-CM

## 2019-05-08 NOTE — Progress Notes (Signed)
Telehealth office visit note for Jose Munoz, D.O- at Primary Care at [facility]   I connected with current patient today and verified that I am speaking with the correct person using two identifiers.   Only the patient (+/- their family members at pt's discretion) and myself were participating in the encounter - This visit type was conducted due to national recommendations for restrictions regarding the COVID-19 Pandemic (e.g. social distancing) in an effort to limit this patient's exposure and mitigate transmission in our community.  This format is felt to be most appropriate for this patient at this time.   - The patient did not have access to video technology or had technical difficulties with video requiring transitioning to audio format only. - No physical exam could be performed with this format, beyond that communicated to [place] by the patient/ family members as noted.   - Additionally my office staff/ schedulers discussed with the patient that there may be a monetary charge related to this service, depending on their medical insurance.   The patient expressed understanding, and agreed to proceed.       History of Present Illness:   I, Toni Amend, am serving as scribe for Dr. Mellody Munoz.   Last had similar abdominal cramping in June of 2019; notes "but I felt like I had to go to the bathroom more frequently at that particular time."  Confirms that aside from the urge to go to the bathroom, which he is not currently feeling, "it feels the same way."  This historical cramping occurred after use of antibiotics, and resolved after use of probiotics, avoidance of dairy, spicy, fried, fatty foods .  - GI Concerns; Abdominal Cramping, Darker than Usual Stools The other day during his regular office visit, he was feeling very good, notes "I haven't even had a cold."  Says that then, the next day, he woke up "and my gut started cramping."  These symptoms went on for the  next three days, now better.  This morning, he's feeling better, "but I can tell that it's still not right."  Says that his symptoms have gotten better each day over the last three days.  "The only other thing I've been doing is belching a lot."  Symptoms started with: stomach cramps.  "They would come on all of a sudden, then relax, that kind of thing."  These cramps occurred all across his belly.  Denies pain, fever, nausea.  Denies diarrhea, denies vomiting, no Nausea.   Thinks his cramping seemed a little worse with food "but not noticeably, I would say."  Says now he feels his symptoms have relaxed.  States "I can still feel that my belly is not right, but there's none of the sharp cramps if you know what I mean."   He has been having normal bowel movements overall.  Reports normal size, normal consistency, "everything seems to be normal there."  "The only thing I can say, is I did notice that my stool seemed to be of a little darker color than it normally is."  Confirms again that his stools are of the same consistency and he's been pooping the same amount, "no more or less frequently."   Thinks his last colonoscopy was ten or more years ago.  Notes was supposed to have a repeat colonoscopy, but then cancelled and never rescheduled it.    Notes his father had "a problem with his colon" in his 25's, but denies known family history  of colon cancer.  Denies personal history of polyp.   Hasn't been eating any unusually rich foods that he knows of; "not that I can recall."  Says "I haven't eaten anything out of the ordinary that I wouldn't normally eat."  Denies radiation of pain or cramping symptoms; states "just in the belly."  Denies unusual stress.  "Nothing that I can think of has really changed."  Denies recent antibiotic use, No travel.  Notes he does not have a way to check his temperature at home    Depression screen [facility] 2/9 05/03/2019 03/10/2018 11/12/2017 10/25/2017  05/17/2017  Decreased Interest 0 0 0 0 0  Down, Depressed, Hopeless 0 0 0 0 0  PHQ - 2 Score 0 0 0 0 0  Altered sleeping 1 0 0 0 0  Tired, decreased energy 0 0 0 0 0  Change in appetite 0 0 0 0 0  Feeling bad or failure about yourself  0 0 0 0 0  Trouble concentrating 0 0 0 0 0  Moving slowly or fidgety/restless 0 0 0 0 0  Suicidal thoughts 0 0 0 0 0  PHQ-9 Score 1 0 0 0 0  Difficult doing work/chores Not difficult at all Not difficult at all - Not difficult at all -      Impression and Recommendations:    1. Stomach cramps, generalized   2. Screening for colon cancer      Stomach Cramps, Generalized; Screening for Colon Cancer - Reviewed patient's symptoms extensively today.  Education provided and all questions answered.  - Discussed that GI cramping may be related to consumption of richer than usual foods, or a lot of dairy etc.   In addition, reviewed that stress can contribute to abdominal pain and cramping.  - Encouraged patient to avoid foods that are spicy, fried, or fatty, and especially avoid dairy.   Told patient to avoid these foods until his belly feels totally normal, and then after two days, slowly begin to reincorporate aforementioned foods as tolerated.  - Told patient to hydrate adequately.  - Told patient to resume probiotics. Which helped in past when he had these same sx  DM:  - Reviewed that being a diabetic, diabetic gastroparesis is a possibility.  Patient's last A1c was slightly above goal at 7.4.  Lengthy conversation held with patient today regarding possible link with symptoms.  Colon Ca screening:  - Per patient, last obtained colonoscopy ten years ago. - Ambulatory referral placed for colonoscopy today.   Importance of this test d/c pt and he tells me he will make sure and go this time. See orders.  - Per patient, father had a "problem with his colon, but he was well into his 67's."  Denies known family history of colon cancer and denies personal  history of polyp.   - Need to come in for Heme occult stool test kit.  Patient will stop by office near future to obtain stool cards.  I notified CMA's of this fact  - If symptoms resume, worsen, or change, patient knows to call in for further assistance and possible prescription Bentyl.   - As part of my medical decision making, I reviewed the following data within the [place] History obtained from pt /family, CMA notes reviewed and incorporated if applicable, Labs reviewed, Radiograph/ tests reviewed if applicable and OV notes from prior OV's with me, as well as other specialists she/he has seen since seeing me last, were all reviewed and  used in my medical decision making process today.    - Additionally, discussion had with patient regarding our treatment plan, and their biases/concerns about that plan were used in my medical decision making today.    - The patient agreed with the plan and demonstrated an understanding of the instructions.   No barriers to understanding were identified.    - Red flag symptoms and signs discussed in detail.  Patient expressed understanding regarding what to do in case of emergency\ urgent symptoms.   - The patient was advised to call back or seek an in-person evaluation if the symptoms worsen or if the condition fails to improve as anticipated.   Return if symptoms worsen or fail to improve, for F-up of current med issues as previously d/c pt.    Orders Placed This Encounter  Procedures  . Ambulatory referral to Gastroenterology    I provided 26 minutes of non face-to-face time during this encounter.  Additional time was spent with charting and coordination of care before and after the actual visit commenced.   Note:  This note was prepared with assistance of Dragon voice recognition software. Occasional wrong-word or sound-a-like substitutions may have occurred due to the inherent limitations of voice recognition  software.   This document serves as a record of services personally performed by Jose Dance, DO. It was created on her behalf by Toni Amend, a trained medical scribe. The creation of this record is based on the scribe's personal observations and the provider's statements to them.   This case required medical decision making of at least moderate complexity. The above documentation has been reviewed to be accurate and was completed by Marjory Sneddon, D.O.     Patient Care Team    Relationship Specialty Notifications Start End  Jose Dance, DO PCP - General Family Medicine  10/16/15      -Vitals obtained; medications/ allergies reconciled;  personal medical, social, Sx etc.histories were updated by CMA, reviewed by me and are reflected in chart   Patient Active Problem List   Diagnosis Date Noted  . Mixed diabetic hyperlipidemia associated with type 2 diabetes mellitus ([place]) 11/12/2017  . Hypertension associated with diabetes ([place]) 11/12/2017  . Diabetes mellitus without complication ([place]) 40/98/1191  . smoking cessation counseling 11/05/2015  . Smoking 10/16/2015  . Obese 10/16/2015  . OA (osteoarthritis) of hip 11/05/2015  . Vitamin D deficiency 11/05/2015  . Stomach cramps, generalized 05/08/2019  . Microalbuminuria due to type 2 diabetes mellitus ([place]) 05/03/2019  . Hypertriglyceridemia 05/03/2019  . Overweight (BMI 25.0-29.9) 05/03/2019  . Anxious mood 01/17/2019  . Insomnia 01/17/2019  . Hearing loss of left ear due to cerumen impaction 09/14/2018     No outpatient medications have been marked as taking for the 05/08/19 encounter (Office Visit) with Jose Dance, DO.     Allergies:  No Known Allergies   ROS:  See above HPI for pertinent positives and negatives   Objective:   Blood pressure 122/76, pulse 85.  (if some vitals are omitted, this means that patient was UNABLE to obtain them even though they were asked to get them prior to  OV today.  They were asked to call us at their earliest convenience with these once obtained. )  General: A & O * 3; sounds in no acute distress; in usual state of health.  Skin: Pt confirms warm and dry extremities and pink fingertips HEENT: Pt confirms lips non-cyanotic Chest: Patient confirms normal chest excursion and movement Respiratory: speaking  in full sentences, no conversational dyspnea; patient confirms no use of accessory muscles Psych: insight appears good, mood- appears full

## 2019-05-26 ENCOUNTER — Ambulatory Visit (INDEPENDENT_AMBULATORY_CARE_PROVIDER_SITE_OTHER): Payer: PPO | Admitting: Family Medicine

## 2019-05-26 ENCOUNTER — Encounter: Payer: Self-pay | Admitting: Family Medicine

## 2019-05-26 ENCOUNTER — Other Ambulatory Visit: Payer: Self-pay

## 2019-05-26 VITALS — BP 124/76 | HR 89 | Temp 97.4°F | Ht 71.0 in | Wt 211.0 lb

## 2019-05-26 DIAGNOSIS — E1159 Type 2 diabetes mellitus with other circulatory complications: Secondary | ICD-10-CM

## 2019-05-26 DIAGNOSIS — Z1211 Encounter for screening for malignant neoplasm of colon: Secondary | ICD-10-CM | POA: Diagnosis not present

## 2019-05-26 DIAGNOSIS — I10 Essential (primary) hypertension: Secondary | ICD-10-CM

## 2019-05-26 DIAGNOSIS — E119 Type 2 diabetes mellitus without complications: Secondary | ICD-10-CM | POA: Diagnosis not present

## 2019-05-26 DIAGNOSIS — Z23 Encounter for immunization: Secondary | ICD-10-CM | POA: Diagnosis not present

## 2019-05-26 DIAGNOSIS — Z122 Encounter for screening for malignant neoplasm of respiratory organs: Secondary | ICD-10-CM | POA: Diagnosis not present

## 2019-05-26 DIAGNOSIS — Z0000 Encounter for general adult medical examination without abnormal findings: Secondary | ICD-10-CM

## 2019-05-26 DIAGNOSIS — E1169 Type 2 diabetes mellitus with other specified complication: Secondary | ICD-10-CM | POA: Diagnosis not present

## 2019-05-26 DIAGNOSIS — E663 Overweight: Secondary | ICD-10-CM

## 2019-05-26 DIAGNOSIS — E781 Pure hyperglyceridemia: Secondary | ICD-10-CM | POA: Diagnosis not present

## 2019-05-26 DIAGNOSIS — E559 Vitamin D deficiency, unspecified: Secondary | ICD-10-CM | POA: Diagnosis not present

## 2019-05-26 DIAGNOSIS — E782 Mixed hyperlipidemia: Secondary | ICD-10-CM

## 2019-05-26 MED ORDER — SHINGRIX 50 MCG/0.5ML IM SUSR: 0.5000 mL | Freq: Once | INTRAMUSCULAR | 0 refills | Status: AC

## 2019-05-26 MED ORDER — PNEUMOVAX 23 25 MCG/0.5ML IJ INJ: 0.5000 mL | INJECTION | INTRAMUSCULAR | 0 refills | Status: AC

## 2019-05-26 NOTE — Progress Notes (Signed)
Subjective:   Jose Munoz is a 69 y.o. male who presents for Medicare Annual/Subsequent preventive examination.  HPI: Patient denies concerns with hearing, memory, falls, or taking care of himself. States he hears fine; he sees fine if he is wearing his glasses. Confirms that he obtains a diabetic eye exam twice yearly. He sees his dentist regularly; states "more often than I want to." Believes his last colonoscopy was done through [facility]. Notes "it's been so long ago, I really can't remember." He does not remember his doctor. Notes he is due for repeat now, "and they called me, and what I told them is that I want to schedule it as soon as I can get the coronavirus vaccine." He is hoping to get the COVID-19 vaccine toward the end of this month or February, and keeping a low profile with doctor's appointments until then. Was having stomach cramps last week; notes he went to the drug store and obtained florajen, and "it looks like the problem has been resolved." Confirms the probiotics worked. "Not having any issues with that at all now."       Objective:    Vitals: BP 124/76   Pulse 89   Temp (!) 97.4 F (36.3 C)   Ht 5\' 11"  (1.803 m)   Wt 211 lb (95.7 kg)   BMI 29.43 kg/m   Body mass index is 29.43 kg/m.  Advanced Directives 07/02/2016 10/16/2015  Does Patient Have a Medical Advance Directive? Yes Yes  Type of Advance Directive - Healthcare Power of [place];Living will  Does patient want to make changes to medical advance directive? - No - Patient declined    Tobacco Social History   Tobacco Use  Smoking Status Current Every Day Smoker  . Packs/day: 1.00  . Years: 25.00  . Pack years: 25.00  . Types: Cigarettes  Smokeless Tobacco Never Used     Ready to quit: Not Answered Counseling given: Not Answered    Past Medical History:  Diagnosis Date  . Diabetes mellitus without complication (HCC)   . Essential hypertension 10/16/2015  . Hyperlipidemia   . OA  (osteoarthritis) of hip 11/05/2015  . Obese 10/16/2015  . Smoking   . Vitamin D deficiency 11/05/2015   History reviewed. No pertinent surgical history. Family History  Problem Relation Age of Onset  . Diabetes Mother   . Congestive Heart Failure Mother   . Cancer Sister        breast  . Cancer Sister        cervical  . Heart attack Brother    Social History   Socioeconomic History  . Marital status: Widowed    Spouse name: Not on file  . Number of children: Not on file  . Years of education: Not on file  . Highest education level: Not on file  Occupational History  . Not on file  Tobacco Use  . Smoking status: Current Every Day Smoker    Packs/day: 1.00    Years: 25.00    Pack years: 25.00    Types: Cigarettes  . Smokeless tobacco: Never Used  Substance and Sexual Activity  . Alcohol use: Yes    Alcohol/week: 3.0 standard drinks    Types: 1 Glasses of wine, 2 Shots of liquor per week  . Drug use: No  . Sexual activity: Never  Other Topics Concern  . Not on file  Social History Narrative  . Not on file   Social Determinants of Health   Financial Resource  Strain:   . Difficulty of Paying Living Expenses: Not on file  Food Insecurity:   . Worried About Programme researcher, broadcasting/film/video in the Last Year: Not on file  . Ran Out of Food in the Last Year: Not on file  Transportation Needs:   . Lack of Transportation (Medical): Not on file  . Lack of Transportation (Non-Medical): Not on file  Physical Activity:   . Days of Exercise per Week: Not on file  . Minutes of Exercise per Session: Not on file  Stress:   . Feeling of Stress : Not on file  Social Connections:   . Frequency of Communication with Friends and Family: Not on file  . Frequency of Social Gatherings with Friends and Family: Not on file  . Attends Religious Services: Not on file  . Active Member of Clubs or Organizations: Not on file  . Attends Banker Meetings: Not on file  . Marital Status: Not  on file    Outpatient Encounter Medications as of 05/26/2019  Medication Sig  . ALPRAZolam (XANAX) 0.5 MG tablet take 1 tablet by mouth if needed for anxiety  . aspirin 81 MG tablet Take 81 mg by mouth daily.  . BD PEN NEEDLE NANO 2ND GEN 32G X 4 MM MISC USE TWICE DAILY  . INVOKAMET [phone] MG TABS TAKE 2 TABLETS BY MOUTH DAILY  . Lancets (ONETOUCH ULTRASOFT) lancets Use to test blood sugar 2 times daily- Dx code E11.9  . lisinopril (ZESTRIL) 10 MG tablet Take 1 tablet (10 mg total) by mouth daily.  Marland Kitchen NOVOLOG FLEXPEN 100 UNIT/ML FlexPen INJECT 8-10 UNITS SUBCUTANEOUS BEFORE MAIN MEAL  . ONETOUCH VERIO test strip TEST TWICE DAILY  . simvastatin (ZOCOR) 40 MG tablet 1 po q hs  . TRESIBA FLEXTOUCH 200 UNIT/ML SOPN ADMINISTER 38 UNITS UNDER THE SKIN EVERY DAY (Patient taking differently: Inject 40 Units into the skin daily. )  . Vitamin D, Ergocalciferol, (DRISDOL) 1.25 MG (50000 UT) CAPS capsule Take 1 capsule (50,000 Units total) by mouth every 7 (seven) days.  . pneumococcal 23 valent vaccine (PNEUMOVAX 23) 25 MCG/0.5ML injection Inject 0.5 mLs into the muscle tomorrow at 10 am for 1 dose.  . traZODone (DESYREL) 50 MG tablet Take 1 tablet (50 mg total) by mouth at bedtime as needed for sleep. **PATIENT NEEDS APT FOR FURTHER REFILLS** (Patient not taking: Reported on 05/26/2019)  . Zoster Vaccine Adjuvanted [facility]) injection Inject 0.5 mLs into the muscle once for 1 dose.   No facility-administered encounter medications on file as of 05/26/2019.    Activities of Daily Living In your present state of health, do you have any difficulty performing the following activities: 05/26/2019 01/17/2019  Hearing? N N  Vision? N N  Difficulty concentrating or making decisions? N N  Walking or climbing stairs? N N  Dressing or bathing? N N  Doing errands, shopping? N N  Some recent data might be hidden    Patient Care Team: Thomasene Lot, DO as PCP - General (Family Medicine) Sallee Lange, MD  (Ophthalmology)    Assessment:   This is a routine wellness examination for Jose Munoz.   Exercise Activities and Dietary recommendations  -AHA guidelines r/w pt & mediterranean diet   Fall Risk Fall Risk  05/26/2019 05/03/2019 03/10/2018 10/25/2017 05/17/2017  Falls in the past year? 0 0 No No No  Number falls in past yr: 0 0 - - -  Injury with Fall? 0 0 - - -  Follow up Falls  evaluation completed Falls evaluation completed - - -    Is the patient's home free of loose throw rugs in walkways, pet beds, electrical cords, etc?   yes      Grab bars in the bathroom? yes      Handrails on the stairs?   yes      Adequate lighting?   no   Timed Get Up and Go Performed: Telehealth   Depression Screen PHQ 2/9 Scores 05/26/2019 05/03/2019 03/10/2018 11/12/2017  PHQ - 2 Score 0 0 0 0  PHQ- 9 Score 1 1 0 0    Cognitive Function 6CIT Screen 05/26/2019 05/17/2017  What Year? 0 points 0 points  What month? 0 points 0 points  What time? 0 points 0 points  Count back from 20 0 points 0 points  Months in reverse 0 points 0 points  Repeat phrase 0 points 0 points  Total Score 0 0    Immunization History  Administered Date(s) Administered  . Influenza, High Dose Seasonal PF 02/21/2016, 05/17/2017, 04/12/2018  . Influenza-Unspecified 02/27/2010, 03/23/2011, 02/10/2012, 04/12/2013, 05/04/2014  . Pneumococcal Conjugate-13 09/09/2016  . Pneumococcal-Unspecified 05/04/2014  . Tdap 01/03/2009  . Zoster 03/23/2011    Qualifies for Shingles Vaccine? Shingles Vaccine ordered for pharmacy Pneumococcal vaccine ordered for pharmacy   Screening Tests Health Maintenance  Topic Date Due  . COLONOSCOPY  06/30/1998  . INFLUENZA VACCINE  12/17/2018  . TETANUS/TDAP  01/04/2019  . OPHTHALMOLOGY EXAM  04/06/2019  . Hepatitis C Screening  05/25/2020 (Originally 03-31-1951)  . FOOT EXAM  06/08/2019  . HEMOGLOBIN A1C  10/09/2019  . PNA vac Low Risk Adult  Completed   Cancer Screenings: Lung: Low Dose CT  Chest recommended if Age 21-80 years, 30 pack-year currently smoking OR have quit w/in 15years. Patient does qualify. Colorectal: Ordered AAA: Ordered   Additional Screenings:  Hepatitis C Screening:Patient declined HIV Screening: Patient declined      Plan:    MEDICARE WELLNESS PLAN: - Discussed need for updated immunizations and screenings. Reviewed recommendations and guidelines extensively with patient today. Emphasized critical need for patient to obtain screenings to help preserve his health and wellbeing. - Need for fasting lab work as advised near future. - Need for TDAP; last obtained 01/03/2009. - Need for influenza vaccination. - Shingles and pneumonia vaccines sent to patient pharmacy. - Continue to obtain diabetic foot exams as advised. Patient will continue to follow up with endocrinologist as established. - Need for AAA screen. See orders. - Need for CT lung scan and ultrasound. See orders. - Per patient, believes his last colonoscopy was done through [facility], but cannot remember. CMA will check patient's records. - Continue regular dental care as established. - Continue to obtain diabetic eye exam every six months as established. - Health education and counseling provided. - For updates regarding the COVID-19 vaccine, told patient to continue checking [place] website, and referring to the [place]. Education and counseling regarding the COVID-19 vaccination provided to patient today.    Return for full fasting lab work near future, FLP, Vit D, flu shot, TDAP, OV in April.  (Please note-  there are labs from 12/4 and 1/8 that will need to be drawn upon pt's f/up lab visit.)    Orders Placed This Encounter  Procedures  . CT CHEST LUNG CA SCREEN LOW DOSE W/O CM  . US AORTA MEDICARE SCREENING  . CBC w/Diff  . CMP (comprehensive metabolic panel)  . TSH  . T4, free  .  VITAMIN D 25 Hydroxy (Vit-D Deficiency, Fractures)  . Lipid panel    I have personally reviewed  and noted the following in the patient's chart:   . Medical and social history . Use of alcohol, tobacco or illicit drugs  . Current medications and supplements . Functional ability and status . Nutritional status . Physical activity . Advanced directives . List of other physicians . Hospitalizations, surgeries, and ER visits in previous 12 months . Vitals . Screenings to include cognitive, depression, and falls . Referrals and appointments  In addition, I have reviewed and discussed with patient certain preventive protocols, quality metrics, and best practice recommendations. A written personalized care plan for preventive services as well as general preventive health recommendations were provided to patient.     Thomasene Lot, DO  05/26/2019

## 2019-05-26 NOTE — Progress Notes (Deleted)
Impression and Recommendations:    1. Encounter for Medicare annual wellness exam   2. Hypertension associated with diabetes (HCC)   3. Diabetes mellitus without complication (HCC)   4. Mixed diabetic hyperlipidemia associated with type 2 diabetes mellitus (HCC)   5. Vitamin D deficiency   6. Hypertriglyceridemia   7. Overweight (BMI 25.0-29.9)   8. Need for shingles vaccine   9. Need for pneumococcal vaccination   10. Encounter for screening for lung cancer   11. Screening for colon cancer      Encounter for Medicare annual wellness exam  Hypertension associated with diabetes (HCC) - Plan: CBC w/Diff, CMP (comprehensive metabolic panel), Hemoglobin A1c, TSH, T4, free, VITAMIN D 25 Hydroxy (Vit-D Deficiency, Fractures), Lipid panel  Diabetes mellitus without complication (HCC) - Plan: CBC w/Diff, CMP (comprehensive metabolic panel), Hemoglobin A1c, TSH, T4, free, VITAMIN D 25 Hydroxy (Vit-D Deficiency, Fractures), Lipid panel  Mixed diabetic hyperlipidemia associated with type 2 diabetes mellitus (HCC) - Plan: CBC w/Diff, CMP (comprehensive metabolic panel), Hemoglobin A1c, TSH, T4, free, VITAMIN D 25 Hydroxy (Vit-D Deficiency, Fractures), Lipid panel  Vitamin D deficiency - Plan: CBC w/Diff, CMP (comprehensive metabolic panel), Hemoglobin A1c, TSH, T4, free, VITAMIN D 25 Hydroxy (Vit-D Deficiency, Fractures), Lipid panel  Hypertriglyceridemia - Plan: CBC w/Diff, CMP (comprehensive metabolic panel), Hemoglobin A1c, TSH, T4, free, VITAMIN D 25 Hydroxy (Vit-D Deficiency, Fractures), Lipid panel  Overweight (BMI 25.0-29.9) - Plan: CBC w/Diff, CMP (comprehensive metabolic panel), Hemoglobin A1c, TSH, T4, free, VITAMIN D 25 Hydroxy (Vit-D Deficiency, Fractures), Lipid panel  Need for shingles vaccine - Plan: Zoster Vaccine Adjuvanted [facility]) injection  Need for pneumococcal vaccination - Plan: pneumococcal 23 valent vaccine (PNEUMOVAX 23) 25 MCG/0.5ML injection  Encounter for  screening for lung cancer - Plan: CT CHEST LUNG CA SCREEN LOW DOSE W/O CM, US [employer] MEDICARE SCREENING  Screening for colon cancer - Plan: Ambulatory referral to Gastroenterology  1. Encounter for Medicare annual wellness exam ***  2. Hypertension associated with diabetes (HCC) *** - CBC w/Diff; Future - CMP (comprehensive metabolic panel); Future - Hemoglobin A1c; Future - TSH; Future - T4, free; Future - VITAMIN D 25 Hydroxy (Vit-D Deficiency, Fractures); Future - Lipid panel; Future  3. Diabetes mellitus without complication (HCC) *** - CBC w/Diff; Future - CMP (comprehensive metabolic panel); Future - Hemoglobin A1c; Future - TSH; Future - T4, free; Future - VITAMIN D 25 Hydroxy (Vit-D Deficiency, Fractures); Future - Lipid panel; Future  4. Mixed diabetic hyperlipidemia associated with type 2 diabetes mellitus (HCC) *** - CBC w/Diff; Future - CMP (comprehensive metabolic panel); Future - Hemoglobin A1c; Future - TSH; Future - T4, free; Future - VITAMIN D 25 Hydroxy (Vit-D Deficiency, Fractures); Future - Lipid panel; Future  5. Vitamin D deficiency *** - CBC w/Diff; Future - CMP (comprehensive metabolic panel); Future - Hemoglobin A1c; Future - TSH; Future - T4, free; Future - VITAMIN D 25 Hydroxy (Vit-D Deficiency, Fractures); Future - Lipid panel; Future  6. Hypertriglyceridemia *** - CBC w/Diff; Future - CMP (comprehensive metabolic panel); Future - Hemoglobin A1c; Future - TSH; Future - T4, free; Future - VITAMIN D 25 Hydroxy (Vit-D Deficiency, Fractures); Future - Lipid panel; Future  7. Overweight (BMI 25.0-29.9) *** - CBC w/Diff; Future - CMP (comprehensive metabolic panel); Future - Hemoglobin A1c; Future - TSH; Future - T4, free; Future - VITAMIN D 25 Hydroxy (Vit-D Deficiency, Fractures); Future - Lipid panel; Future  8. Need for shingles vaccine *** - Zoster Vaccine Adjuvanted [facility]) injection;  Inject 0.5 mLs into the muscle  once for 1 dose.  Dispense: 0.5 mL; Refill: 0  9. Need for pneumococcal vaccination *** - pneumococcal 23 valent vaccine (PNEUMOVAX 23) 25 MCG/0.5ML injection; Inject 0.5 mLs into the muscle tomorrow at 10 am for 1 dose.  Dispense: 2.5 mL; Refill: 0  10. Encounter for screening for lung cancer *** - CT CHEST LUNG CA SCREEN LOW DOSE W/O CM - US [employer] MEDICARE SCREENING; Future  11. Screening for colon cancer *** - Ambulatory referral to Gastroenterology      Orders Placed This Encounter  Procedures  . CT CHEST LUNG CA SCREEN LOW DOSE W/O CM  . US [employer] MEDICARE SCREENING  . CBC w/Diff  . CMP (comprehensive metabolic panel)  . Hemoglobin A1c  . TSH  . T4, free  . VITAMIN D 25 Hydroxy (Vit-D Deficiency, Fractures)  . Lipid panel  . Ambulatory referral to Gastroenterology    Meds ordered this encounter  Medications  . Zoster Vaccine Adjuvanted [facility]) injection    Sig: Inject 0.5 mLs into the muscle once for 1 dose.    Dispense:  0.5 mL    Refill:  0  . pneumococcal 23 valent vaccine (PNEUMOVAX 23) 25 MCG/0.5ML injection    Sig: Inject 0.5 mLs into the muscle tomorrow at 10 am for 1 dose.    Dispense:  2.5 mL    Refill:  0    There are no discontinued medications.   Gross side effects, risk and benefits, and alternatives of medications and treatment plan in general discussed with patient.  Patient is aware that all medications have potential side effects and we are unable to predict every side effect or drug-drug interaction that may occur.   Patient will call with any questions prior to using medication if they have concerns.    Expresses verbal understanding and consents to current therapy and treatment regimen.  No barriers to understanding were identified.  Red flag symptoms and signs discussed in detail.  Patient expressed understanding regarding what to do in case of emergency\urgent symptoms  Please see AVS handed out to patient at the end of our visit for  further patient instructions/ counseling done pertaining to today's office visit.   No follow-ups on file.     Note:  This note was prepared with assistance of Dragon voice recognition software. Occasional wrong-word or sound-a-like substitutions may have occurred due to the inherent limitations of voice recognition software.      --------------------------------------------------------------------------------------------------------------------------------------------------------------------------------------------------------------------------------------------    Subjective:     HPI: Jose Munoz is a 70 y.o. male who presents to [facility] today for issues as discussed below.   ***    Wt Readings from Last 3 Encounters:  05/26/19 211 lb (95.7 kg)  05/03/19 212 lb (96.2 kg)  01/17/19 208 lb (94.3 kg)   BP Readings from Last 3 Encounters:  05/26/19 124/76  05/08/19 122/76  05/03/19 128/78   Pulse Readings from Last 3 Encounters:  05/26/19 89  05/08/19 85  01/17/19 92   BMI Readings from Last 3 Encounters:  05/26/19 29.43 kg/m  05/03/19 29.57 kg/m  01/17/19 29.42 kg/m     Patient Care Team    Relationship Specialty Notifications Start End  Thomasene Lot, DO PCP - General Family Medicine  10/16/15      Patient Active Problem List   Diagnosis Date Noted  . Mixed diabetic hyperlipidemia associated with type 2 diabetes mellitus (HCC) 11/12/2017  . Hypertension associated  with diabetes ([place]) 11/12/2017  . Diabetes mellitus without complication ([place]) 28/41/3244  . smoking cessation counseling 11/05/2015  . Smoking 10/16/2015  . Obese 10/16/2015  . OA (osteoarthritis) of hip 11/05/2015  . Vitamin D deficiency 11/05/2015  . Stomach cramps, generalized 05/08/2019  . Microalbuminuria due to type 2 diabetes mellitus ([place]) 05/03/2019  . Hypertriglyceridemia 05/03/2019  . Overweight (BMI 25.0-29.9) 05/03/2019  . Anxious mood  01/17/2019  . Insomnia 01/17/2019  . Hearing loss of left ear due to cerumen impaction 09/14/2018    Past Medical history, Surgical history, Family history, Social history, Allergies and Medications have been entered into the medical record, reviewed and changed as needed.    Current Meds  Medication Sig  . ALPRAZolam (XANAX) 0.5 MG tablet take 1 tablet by mouth if needed for anxiety  . aspirin 81 MG tablet Take 81 mg by mouth daily.  . BD PEN NEEDLE NANO 2ND GEN 32G X 4 MM MISC USE TWICE DAILY  . INVOKAMET [phone] MG TABS TAKE 2 TABLETS BY MOUTH DAILY  . Lancets (ONETOUCH ULTRASOFT) lancets Use to test blood sugar 2 times daily- Dx code E11.9  . lisinopril (ZESTRIL) 10 MG tablet Take 1 tablet (10 mg total) by mouth daily.  Marland Kitchen NOVOLOG FLEXPEN 100 UNIT/ML FlexPen INJECT 8-10 UNITS SUBCUTANEOUS BEFORE MAIN MEAL  . ONETOUCH VERIO test strip TEST TWICE DAILY  . simvastatin (ZOCOR) 40 MG tablet 1 po q hs  . TRESIBA FLEXTOUCH 200 UNIT/ML SOPN ADMINISTER 38 UNITS UNDER THE SKIN EVERY DAY (Patient taking differently: Inject 40 Units into the skin daily. )  . Vitamin D, Ergocalciferol, (DRISDOL) 1.25 MG (50000 UT) CAPS capsule Take 1 capsule (50,000 Units total) by mouth every 7 (seven) days.    Allergies:  No Known Allergies   Review of Systems:  A fourteen system review of systems was performed and found to be positive as per HPI.   Objective:   Blood pressure 124/76, pulse 89, temperature (!) 97.4 F (36.3 C), height 5\' 11"  (1.803 m), weight 211 lb (95.7 kg). Body mass index is 29.43 kg/m. General:  Well Developed, well nourished, appropriate for stated age.  Neuro:  Alert and oriented,  extra-ocular muscles intact  HEENT:  Normocephalic, atraumatic, neck supple, no carotid bruits appreciated  Skin:  no gross rash, warm, pink. Cardiac:  RRR, S1 S2 Respiratory:  ECTA B/L and A/P, Not using accessory muscles, speaking in full sentences- unlabored. Vascular:  Ext warm, no cyanosis  apprec.; cap RF less 2 sec. Psych:  No HI/SI, judgement and insight good, Euthymic mood. Full Affect.     Subjective:   Jose Munoz is a 69 y.o. male who presents for Medicare Annual/Subsequent preventive examination.  Review of Systems:  ***       Objective:    Vitals: BP 124/76   Pulse 89   Temp (!) 97.4 F (36.3 C)   Ht 5\' 11"  (1.803 m)   Wt 211 lb (95.7 kg)   BMI 29.43 kg/m   Body mass index is 29.43 kg/m.  Advanced Directives 07/02/2016 10/16/2015  Does Patient Have a Medical Advance Directive? Yes Yes  Type of Advance Directive - [place];Living will  Does patient want to make changes to medical advance directive? - No - Patient declined    Tobacco Social History   Tobacco Use  Smoking Status Current Every Day Smoker  . Packs/day: 1.00  . Years: 25.00  . Pack years: 25.00  . Types: Cigarettes  Smokeless Tobacco  Never Used     Ready to quit: Not Answered Counseling given: Not Answered   Clinical Intake:                       Past Medical History:  Diagnosis Date  . Diabetes mellitus without complication (HCC)   . Essential hypertension 10/16/2015  . Hyperlipidemia   . OA (osteoarthritis) of hip 11/05/2015  . Obese 10/16/2015  . Smoking   . Vitamin D deficiency 11/05/2015   History reviewed. No pertinent surgical history. Family History  Problem Relation Age of Onset  . Diabetes Mother   . Congestive Heart Failure Mother   . Cancer Sister        breast  . Cancer Sister        cervical  . Heart attack Brother    Social History   Socioeconomic History  . Marital status: Widowed    Spouse name: Not on file  . Number of children: Not on file  . Years of education: Not on file  . Highest education level: Not on file  Occupational History  . Not on file  Tobacco Use  . Smoking status: Current Every Day Smoker    Packs/day: 1.00    Years: 25.00    Pack years: 25.00    Types: Cigarettes  . Smokeless  tobacco: Never Used  Substance and Sexual Activity  . Alcohol use: Yes    Alcohol/week: 3.0 standard drinks    Types: 1 Glasses of wine, 2 Shots of liquor per week  . Drug use: No  . Sexual activity: Never  Other Topics Concern  . Not on file  Social History Narrative  . Not on file   Social Determinants of Health   Financial Resource Strain:   . Difficulty of Paying Living Expenses: Not on file  Food Insecurity:   . Worried About Programme researcher, broadcasting/film/videounning Out of Food in the Last Year: Not on file  . Ran Out of Food in the Last Year: Not on file  Transportation Needs:   . Lack of Transportation (Medical): Not on file  . Lack of Transportation (Non-Medical): Not on file  Physical Activity:   . Days of Exercise per Week: Not on file  . Minutes of Exercise per Session: Not on file  Stress:   . Feeling of Stress : Not on file  Social Connections:   . Frequency of Communication with Friends and Family: Not on file  . Frequency of Social Gatherings with Friends and Family: Not on file  . Attends Religious Services: Not on file  . Active Member of Clubs or Organizations: Not on file  . Attends BankerClub or Organization Meetings: Not on file  . Marital Status: Not on file    Outpatient Encounter Medications as of 05/26/2019  Medication Sig  . ALPRAZolam (XANAX) 0.5 MG tablet take 1 tablet by mouth if needed for anxiety  . aspirin 81 MG tablet Take 81 mg by mouth daily.  . BD PEN NEEDLE NANO 2ND GEN 32G X 4 MM MISC USE TWICE DAILY  . INVOKAMET [phone] MG TABS TAKE 2 TABLETS BY MOUTH DAILY  . Lancets (ONETOUCH ULTRASOFT) lancets Use to test blood sugar 2 times daily- Dx code E11.9  . lisinopril (ZESTRIL) 10 MG tablet Take 1 tablet (10 mg total) by mouth daily.  Marland Kitchen. NOVOLOG FLEXPEN 100 UNIT/ML FlexPen INJECT 8-10 UNITS SUBCUTANEOUS BEFORE MAIN MEAL  . ONETOUCH VERIO test strip TEST TWICE DAILY  . simvastatin (ZOCOR) 40  MG tablet 1 po q hs  . TRESIBA FLEXTOUCH 200 UNIT/ML SOPN ADMINISTER 38 UNITS UNDER THE  SKIN EVERY DAY (Patient taking differently: Inject 40 Units into the skin daily. )  . Vitamin D, Ergocalciferol, (DRISDOL) 1.25 MG (50000 UT) CAPS capsule Take 1 capsule (50,000 Units total) by mouth every 7 (seven) days.  . pneumococcal 23 valent vaccine (PNEUMOVAX 23) 25 MCG/0.5ML injection Inject 0.5 mLs into the muscle tomorrow at 10 am for 1 dose.  . traZODone (DESYREL) 50 MG tablet Take 1 tablet (50 mg total) by mouth at bedtime as needed for sleep. **PATIENT NEEDS APT FOR FURTHER REFILLS** (Patient not taking: Reported on 05/26/2019)  . Zoster Vaccine Adjuvanted [facility](SHINGRIX) injection Inject 0.5 mLs into the muscle once for 1 dose.   No facility-administered encounter medications on file as of 05/26/2019.    Activities of Daily Living In your present state of health, do you have any difficulty performing the following activities: 05/26/2019 01/17/2019  Hearing? N N  Vision? N N  Difficulty concentrating or making decisions? N N  Walking or climbing stairs? N N  Dressing or bathing? N N  Doing errands, shopping? N N  Some recent data might be hidden    Patient Care Team: Thomasene Lotpalski, Demonta Wombles, DO as PCP - General (Family Medicine)   Assessment:   This is a routine wellness examination for Jose Munoz.  Exercise Activities and Dietary recommendations    Goals   None     Fall Risk Fall Risk  05/26/2019 05/03/2019 03/10/2018 10/25/2017 05/17/2017  Falls in the past year? 0 0 No No No  Number falls in past yr: 0 0 - - -  Injury with Fall? 0 0 - - -  Follow up Falls evaluation completed Falls evaluation completed - - -   Is the patient's home free of loose throw rugs in walkways, pet beds, electrical cords, etc?   no      Grab bars in the bathroom? no      Handrails on the stairs?   yes      Adequate lighting?   yes  Timed Get Up and Go Performed: Telehealth  Depression Screen PHQ 2/9 Scores 05/26/2019 05/03/2019 03/10/2018 11/12/2017  PHQ - 2 Score 0 0 0 0  PHQ- 9 Score 1 1 0 0     Cognitive Function     6CIT Screen 05/26/2019 05/17/2017  What Year? 0 points 0 points  What month? 0 points 0 points  What time? 0 points 0 points  Count back from 20 0 points 0 points  Months in reverse 0 points 0 points  Repeat phrase 0 points 0 points  Total Score 0 0    Immunization History  Administered Date(s) Administered  . Influenza, High Dose Seasonal PF 02/21/2016, 05/17/2017, 04/12/2018  . Influenza-Unspecified 02/27/2010, 03/23/2011, 02/10/2012, 04/12/2013, 05/04/2014  . Pneumococcal Conjugate-13 09/09/2016  . Pneumococcal-Unspecified 05/04/2014  . Tdap 01/03/2009  . Zoster 03/23/2011    Qualifies for Shingles Vaccine? Ordered for Pharmacy Tdap Vaccine: Patient states he will get at nurse visit apt. Influenza Vaccine: patient states he will come in for nurse visit to get  Pneumococcal 23: Ordered for pharmacy  Screening Tests Health Maintenance  Topic Date Due  . COLONOSCOPY  06/30/1998  . INFLUENZA VACCINE  12/17/2018  . TETANUS/TDAP  01/04/2019  . OPHTHALMOLOGY EXAM  04/06/2019  . Hepatitis C Screening  05/25/2020 (Originally [birth year])  . FOOT EXAM  06/08/2019  . HEMOGLOBIN A1C  10/09/2019  . PNA vac Low  Risk Adult  Completed   Cancer Screenings: Lung: Low Dose CT Chest recommended if Age 92-80 years, 30 pack-year currently smoking OR have quit w/in 15years. Patient does qualify. AAA Screening ordered. Colorectal: Patient states his last colonoscopy was 10 years ago. He states they have called him recently to schedule this procedure but he has postponed due to COVID. States will schedule once he receives the COVID vaccine.   Additional Screenings:  Hepatitis C Screening: Patient declined HIV Screening: Patient declined      Plan:   ***  I have personally reviewed and noted the following in the patient's chart:   . Medical and social history . Use of alcohol, tobacco or illicit drugs  . Current medications and supplements . Functional  ability and status . Nutritional status . Physical activity . Advanced directives . List of other physicians . Hospitalizations, surgeries, and ER visits in previous 12 months . Vitals . Screenings to include cognitive, depression, and falls . Referrals and appointments  In addition, I have reviewed and discussed with patient certain preventive protocols, quality metrics, and best practice recommendations. A written personalized care plan for preventive services as well as general preventive health recommendations were provided to patient.     Thomasene Lot, DO  05/26/2019

## 2019-05-30 ENCOUNTER — Ambulatory Visit: Payer: PPO | Admitting: Family Medicine

## 2019-05-31 ENCOUNTER — Telehealth: Payer: Self-pay

## 2019-05-31 ENCOUNTER — Other Ambulatory Visit: Payer: Self-pay | Admitting: Endocrinology

## 2019-05-31 NOTE — Telephone Encounter (Signed)
-----   Message from Reather Littler, MD sent at 05/31/2019  8:36 AM EST ----- Regarding: Follow-up appointment Please set him up for follow-up appointment with labs in March for diabetes

## 2019-05-31 NOTE — Telephone Encounter (Signed)
Patient has been scheduled for 07/25/2019

## 2019-06-02 ENCOUNTER — Other Ambulatory Visit: Payer: Self-pay | Admitting: Endocrinology

## 2019-06-04 ENCOUNTER — Other Ambulatory Visit: Payer: Self-pay | Admitting: Endocrinology

## 2019-06-06 ENCOUNTER — Ambulatory Visit: Payer: PPO

## 2019-06-13 ENCOUNTER — Ambulatory Visit (INDEPENDENT_AMBULATORY_CARE_PROVIDER_SITE_OTHER): Payer: PPO | Admitting: Family Medicine

## 2019-06-13 ENCOUNTER — Other Ambulatory Visit: Payer: Self-pay

## 2019-06-13 ENCOUNTER — Other Ambulatory Visit: Payer: PPO

## 2019-06-13 VITALS — Temp 97.9°F

## 2019-06-13 DIAGNOSIS — E1159 Type 2 diabetes mellitus with other circulatory complications: Secondary | ICD-10-CM | POA: Diagnosis not present

## 2019-06-13 DIAGNOSIS — E1165 Type 2 diabetes mellitus with hyperglycemia: Secondary | ICD-10-CM | POA: Diagnosis not present

## 2019-06-13 DIAGNOSIS — E781 Pure hyperglyceridemia: Secondary | ICD-10-CM | POA: Diagnosis not present

## 2019-06-13 DIAGNOSIS — I10 Essential (primary) hypertension: Secondary | ICD-10-CM | POA: Diagnosis not present

## 2019-06-13 DIAGNOSIS — E663 Overweight: Secondary | ICD-10-CM

## 2019-06-13 DIAGNOSIS — Z23 Encounter for immunization: Secondary | ICD-10-CM | POA: Diagnosis not present

## 2019-06-13 DIAGNOSIS — E1169 Type 2 diabetes mellitus with other specified complication: Secondary | ICD-10-CM

## 2019-06-13 DIAGNOSIS — E119 Type 2 diabetes mellitus without complications: Secondary | ICD-10-CM

## 2019-06-13 DIAGNOSIS — E1129 Type 2 diabetes mellitus with other diabetic kidney complication: Secondary | ICD-10-CM

## 2019-06-13 DIAGNOSIS — E782 Mixed hyperlipidemia: Secondary | ICD-10-CM | POA: Diagnosis not present

## 2019-06-13 DIAGNOSIS — E559 Vitamin D deficiency, unspecified: Secondary | ICD-10-CM | POA: Diagnosis not present

## 2019-06-13 DIAGNOSIS — R809 Proteinuria, unspecified: Secondary | ICD-10-CM | POA: Diagnosis not present

## 2019-06-13 DIAGNOSIS — I152 Hypertension secondary to endocrine disorders: Secondary | ICD-10-CM

## 2019-06-13 DIAGNOSIS — Z794 Long term (current) use of insulin: Secondary | ICD-10-CM | POA: Diagnosis not present

## 2019-06-13 NOTE — Progress Notes (Signed)
Patient here today for TDAP vaccine. Given in R deltoid. Patient tolerated well. AS, CMA

## 2019-06-14 LAB — CBC WITH DIFFERENTIAL/PLATELET
Basophils Absolute: 0.1 10*3/uL (ref 0.0–0.2)
Basos: 1 %
EOS (ABSOLUTE): 0.6 10*3/uL — ABNORMAL HIGH (ref 0.0–0.4)
Eos: 6 %
Hematocrit: 51.2 % — ABNORMAL HIGH (ref 37.5–51.0)
Hemoglobin: 17.3 g/dL (ref 13.0–17.7)
Immature Grans (Abs): 0 10*3/uL (ref 0.0–0.1)
Immature Granulocytes: 0 %
Lymphocytes Absolute: 4 10*3/uL — ABNORMAL HIGH (ref 0.7–3.1)
Lymphs: 40 %
MCH: 30.4 pg (ref 26.6–33.0)
MCHC: 33.8 g/dL (ref 31.5–35.7)
MCV: 90 fL (ref 79–97)
Monocytes Absolute: 0.7 10*3/uL (ref 0.1–0.9)
Monocytes: 7 %
Neutrophils Absolute: 4.7 10*3/uL (ref 1.4–7.0)
Neutrophils: 46 %
Platelets: 330 10*3/uL (ref 150–450)
RBC: 5.7 x10E6/uL (ref 4.14–5.80)
RDW: 12.5 % (ref 11.6–15.4)
WBC: 10.2 10*3/uL (ref 3.4–10.8)

## 2019-06-14 LAB — COMPREHENSIVE METABOLIC PANEL
ALT: 18 IU/L (ref 0–44)
AST: 14 IU/L (ref 0–40)
Albumin/Globulin Ratio: 1.4 (ref 1.2–2.2)
Albumin: 4.4 g/dL (ref 3.8–4.8)
Alkaline Phosphatase: 71 IU/L (ref 39–117)
BUN/Creatinine Ratio: 18 (ref 10–24)
BUN: 15 mg/dL (ref 8–27)
Bilirubin Total: 0.3 mg/dL (ref 0.0–1.2)
CO2: 21 mmol/L (ref 20–29)
Calcium: 9.4 mg/dL (ref 8.6–10.2)
Chloride: 102 mmol/L (ref 96–106)
Creatinine, Ser: 0.82 mg/dL (ref 0.76–1.27)
GFR calc Af Amer: 104 mL/min/{1.73_m2} (ref >59)
GFR calc non Af Amer: 90 mL/min/{1.73_m2} (ref >59)
Globulin, Total: 3.1 g/dL (ref 1.5–4.5)
Glucose: 106 mg/dL — ABNORMAL HIGH (ref 65–99)
Potassium: 4.8 mmol/L (ref 3.5–5.2)
Sodium: 138 mmol/L (ref 134–144)
Total Protein: 7.5 g/dL (ref 6.0–8.5)

## 2019-06-14 LAB — LIPID PANEL
Chol/HDL Ratio: 3.3 ratio (ref 0.0–5.0)
Cholesterol, Total: 119 mg/dL (ref 100–199)
HDL: 36 mg/dL — ABNORMAL LOW (ref >39)
LDL Chol Calc (NIH): 64 mg/dL (ref 0–99)
Triglycerides: 104 mg/dL (ref 0–149)
VLDL Cholesterol Cal: 19 mg/dL (ref 5–40)

## 2019-06-14 LAB — HEMOGLOBIN A1C
Est. average glucose Bld gHb Est-mCnc: 163 mg/dL
Hgb A1c MFr Bld: 7.3 % — ABNORMAL HIGH (ref 4.8–5.6)

## 2019-06-14 LAB — VITAMIN D 25 HYDROXY (VIT D DEFICIENCY, FRACTURES): Vit D, 25-Hydroxy: 89.9 ng/mL (ref 30.0–100.0)

## 2019-06-14 LAB — MICROALBUMIN / CREATININE URINE RATIO
Creatinine, Urine: 104.4 mg/dL
Microalb/Creat Ratio: 102 mg/g creat — ABNORMAL HIGH (ref 0–29)
Microalbumin, Urine: 106.1 ug/mL

## 2019-06-14 LAB — TSH: TSH: 1.95 u[IU]/mL (ref 0.450–4.500)

## 2019-06-14 LAB — T4, FREE: Free T4: 1.35 ng/dL (ref 0.82–1.77)

## 2019-06-17 ENCOUNTER — Ambulatory Visit: Payer: PPO

## 2019-06-20 ENCOUNTER — Other Ambulatory Visit: Payer: Self-pay | Admitting: Endocrinology

## 2019-06-24 ENCOUNTER — Ambulatory Visit: Payer: PPO | Attending: Internal Medicine

## 2019-06-24 DIAGNOSIS — Z23 Encounter for immunization: Secondary | ICD-10-CM | POA: Insufficient documentation

## 2019-06-24 NOTE — Progress Notes (Signed)
   Covid-19 Vaccination Clinic  Name:  Jose Munoz    MRN: [id number] DOB: Mar 01, 1951  06/24/2019  Jose Munoz was observed post Covid-19 immunization for 15 minutes without incidence. He was provided with Vaccine Information Sheet and instruction to access the V-Safe system.   Jose Munoz was instructed to call 911 with any severe reactions post vaccine: Marland Kitchen Difficulty breathing  . Swelling of your face and throat  . A fast heartbeat  . A bad rash all over your body  . Dizziness and weakness    Immunizations Administered    Name Date Dose VIS Date Route   Pfizer COVID-19 Vaccine 06/24/2019  5:43 PM 0.3 mL 04/28/2019 Intramuscular   Manufacturer: [employer], [employer]   Lot: [id number]   NDC: [address]-7

## 2019-06-28 ENCOUNTER — Ambulatory Visit: Payer: PPO

## 2019-07-18 ENCOUNTER — Other Ambulatory Visit: Payer: Self-pay

## 2019-07-18 ENCOUNTER — Other Ambulatory Visit: Payer: Self-pay | Admitting: Endocrinology

## 2019-07-18 ENCOUNTER — Other Ambulatory Visit: Payer: PPO

## 2019-07-18 DIAGNOSIS — Z794 Long term (current) use of insulin: Secondary | ICD-10-CM

## 2019-07-18 DIAGNOSIS — E1165 Type 2 diabetes mellitus with hyperglycemia: Secondary | ICD-10-CM

## 2019-07-18 NOTE — Addendum Note (Signed)
Addended by: Stan Head on: 07/18/2019 09:01 AM   Modules accepted: Orders

## 2019-07-19 ENCOUNTER — Ambulatory Visit: Payer: PPO | Attending: Internal Medicine

## 2019-07-19 DIAGNOSIS — Z23 Encounter for immunization: Secondary | ICD-10-CM | POA: Insufficient documentation

## 2019-07-19 LAB — BASIC METABOLIC PANEL
BUN/Creatinine Ratio: 19 (ref 10–24)
BUN: 15 mg/dL (ref 8–27)
CO2: 22 mmol/L (ref 20–29)
Calcium: 9.8 mg/dL (ref 8.6–10.2)
Chloride: 98 mmol/L (ref 96–106)
Creatinine, Ser: 0.78 mg/dL (ref 0.76–1.27)
GFR calc Af Amer: 105 mL/min/{1.73_m2} (ref >59)
GFR calc non Af Amer: 91 mL/min/{1.73_m2} (ref >59)
Glucose: 124 mg/dL — ABNORMAL HIGH (ref 65–99)
Potassium: 5.1 mmol/L (ref 3.5–5.2)
Sodium: 135 mmol/L (ref 134–144)

## 2019-07-19 LAB — FRUCTOSAMINE: Fructosamine: 261 umol/L (ref 0–285)

## 2019-07-19 NOTE — Progress Notes (Signed)
   Covid-19 Vaccination Clinic  Name:  Braelon Sprung    MRN: [id number] DOB: 10-Nov-1950  07/19/2019  Mr. Dowis was observed post Covid-19 immunization for 15 minutes without incident. He was provided with Vaccine Information Sheet and instruction to access the V-Safe system.   Mr. Feria was instructed to call 911 with any severe reactions post vaccine: Marland Kitchen Difficulty breathing  . Swelling of face and throat  . A fast heartbeat  . A bad rash all over body  . Dizziness and weakness   Immunizations Administered    Name Date Dose VIS Date Route   Pfizer COVID-19 Vaccine 07/19/2019  2:56 PM 0.3 mL 04/28/2019 Intramuscular   Manufacturer: [employer], [employer]   Lot: [id number]   NDC: [address]-8

## 2019-07-25 ENCOUNTER — Encounter: Payer: Self-pay | Admitting: Endocrinology

## 2019-07-25 ENCOUNTER — Ambulatory Visit (INDEPENDENT_AMBULATORY_CARE_PROVIDER_SITE_OTHER): Payer: PPO | Admitting: Endocrinology

## 2019-07-25 ENCOUNTER — Other Ambulatory Visit: Payer: Self-pay

## 2019-07-25 DIAGNOSIS — Z794 Long term (current) use of insulin: Secondary | ICD-10-CM | POA: Diagnosis not present

## 2019-07-25 DIAGNOSIS — E1165 Type 2 diabetes mellitus with hyperglycemia: Secondary | ICD-10-CM | POA: Diagnosis not present

## 2019-07-25 DIAGNOSIS — E1129 Type 2 diabetes mellitus with other diabetic kidney complication: Secondary | ICD-10-CM | POA: Diagnosis not present

## 2019-07-25 DIAGNOSIS — I10 Essential (primary) hypertension: Secondary | ICD-10-CM | POA: Diagnosis not present

## 2019-07-25 DIAGNOSIS — R809 Proteinuria, unspecified: Secondary | ICD-10-CM | POA: Diagnosis not present

## 2019-07-25 NOTE — Progress Notes (Signed)
Patient ID: Jose Munoz, male   DOB: Nov 15, 1950, 69 y.o.   MRN: [id number]           Reason for Appointment:  Follow-up for Type 2 Diabetes  Referring physician: Opalski   I connected with the above-named patient by video enabled telemedicine application and verified that I am speaking with the correct person. The patient was explained the limitations of evaluation and management by telemedicine and the availability of in person appointments.  Patient also understood that there may be a patient responsible charge related to this service . Location of the patient: Patient's home . Location of the provider: Physician office Only the patient and myself were participating in the encounter The patient understood the above statements and agreed to proceed.   History of Present Illness:          Date of diagnosis of type 2 diabetes mellitus: 1998       Background history:   He thinks his diabetes was relatively easier to control and the onset even though he had initial symptoms of thirst and weight loss He had done very well with his diet and lost a significant amount of weight and was approved to control his diabetes without medications for a few years Subsequently he started with oral medications including metformin, Actos and Amaryl He thinks his blood sugars were well controlled for a few years initially but started getting higher progressively since 2016. Because of poor control he was started on basal bolus insulin regimen on his initial consultation, highest A1c was 11.9 in 01/2016  Recent history:    INSULIN regimen: TRESIBA 40 units daily at bedtime, Novolog 10 units at dinner  Non-insulin hypoglycemic drugs the patient is taking are:Invokamet 150/1000 twice a day  Current management, blood sugar patterns and problems identified:  His A1c is still about the same at 7.3  He was taking as much as 12 years of NovoLog at dinnertime but now takes only up to 10 units  He says  he can usually predict when his blood sugars are going to be high based on his carbohydrate intake but still does not adjust his NovoLog much  As before he checks blood sugars only rarely after meals and none after lunch  Fasting readings are only occasionally increased  Last night blood sugar was 177 after eating with eating some corn and potatoes  He will not take any insulin to cover his lunch as he was under the impression that NovoLog and was only taking once a day, this has been previously discussed in detail  He is frequently not eating breakfast  Recently is starting to do just a little walking on the treadmill and is planning to start golf  His weight is 208 at home, previously 211 in the office  No hypoglycemic symptoms present       Side effects from medications have been: None  Compliance with the medical regimen: Fair  Glucose monitoring:  done less than 1 times a day         Glucometer:   One Touch Verio  Blood sugars by patient history:   PRE-MEAL Fasting Lunch Dinner Bedtime Overall  Glucose range:  101-139      Mean/median:     132   POST-MEAL PC Breakfast PC Lunch PC Dinner  Glucose range:    147 177  Mean/median:      PREVIOUS readings:  PRE-MEAL Fasting Lunch Dinner Bedtime Overall  Glucose range:  119-134  107, 133  Mean/median:      132   POST-MEAL PC Breakfast PC Lunch PC Dinner  Glucose range:  191  ?  160-170  Mean/median:        Self-care: The diet that the patient has been following is: tries to limit sweets and drinks with sugar .      Typical meal intake: Breakfast is egg, toast occasionally, sometimes skipped or; Lunch is a sandwich, has various snacks    Dinner 5-7 P.m.            Dietician visit, most recent: 06/2016 CDE visit: 11/17              Weight history:  Wt Readings from Last 3 Encounters:  05/26/19 211 lb (95.7 kg)  05/03/19 212 lb (96.2 kg)  01/17/19 208 lb (94.3 kg)    Glycemic control:   Lab Results    Component Value Date   HGBA1C 7.3 (H) 06/13/2019   HGBA1C 7.4 (H) 04/11/2019   HGBA1C 7.2 (H) 12/13/2018   Lab Results  Component Value Date   MICROALBUR 4.3 (H) 08/11/2016   LDLCALC 64 06/13/2019   CREATININE 0.78 07/18/2019   Lab Results  Component Value Date   MICRALBCREAT 102 (H) 06/13/2019       Allergies as of 07/25/2019   No Known Allergies     Medication List       Accurate as of July 25, 2019 11:05 AM. If you have any questions, ask your nurse or doctor.        STOP taking these medications   traZODone 50 MG tablet Commonly known as: DESYREL Stopped by: Elayne Snare, MD     TAKE these medications   ALPRAZolam 0.5 MG tablet Commonly known as: XANAX take 1 tablet by mouth if needed for anxiety   aspirin 81 MG tablet Take 81 mg by mouth daily.   BD Pen Needle Nano 2nd Gen 32G X 4 MM Misc Generic drug: Insulin Pen Needle USE TWICE DAILY   Insulin Aspart FlexPen 100 UNIT/ML Sopn INJECT 8-10 UNITS SUBCUTANEOUSLY BEFORE MAIN MEAL DAILY   Invokamet [phone] MG Tabs Generic drug: Canagliflozin-metFORMIN HCl TAKE 2 TABLETS BY MOUTH DAILY   lisinopril 10 MG tablet Commonly known as: ZESTRIL Take 1 tablet (10 mg total) by mouth daily.   onetouch ultrasoft lancets Use to test blood sugar 2 times daily- Dx code E11.9   OneTouch Verio test strip Generic drug: glucose blood TEST TWICE DAILY   simvastatin 40 MG tablet Commonly known as: ZOCOR 1 po q hs   Tresiba FlexTouch 200 UNIT/ML FlexTouch Pen Generic drug: insulin degludec Inject 40 Units into the skin daily.   Vitamin D (Ergocalciferol) 1.25 MG (50000 UNIT) Caps capsule Commonly known as: DRISDOL Take 1 capsule (50,000 Units total) by mouth every 7 (seven) days.       Allergies: No Known Allergies  Past Medical History:  Diagnosis Date  . Diabetes mellitus without complication ([place])   . Essential hypertension 10/16/2015  . Hyperlipidemia   . OA (osteoarthritis) of hip 11/05/2015  .  Obese 10/16/2015  . Smoking   . Vitamin D deficiency 11/05/2015    History reviewed. No pertinent surgical history.  Family History  Problem Relation Age of Onset  . Diabetes Mother   . Congestive Heart Failure Mother   . Cancer Sister        breast  . Cancer Sister        cervical  . Heart attack Brother  Social History:  reports that he has been smoking cigarettes. He has a 25.00 pack-year smoking history. He has never used smokeless tobacco. He reports current alcohol use of about 3.0 standard drinks of alcohol per week. He reports that he does not use drugs.   Review of Systems   Lipid history: Has good LDL levels with taking simvastatin 40 mg prescribed by his PCP HDL low but triglycerides are normal    Lab Results  Component Value Date   CHOL 119 06/13/2019   HDL 36 (L) 06/13/2019   LDLCALC 64 06/13/2019   TRIG 104 06/13/2019   CHOLHDL 3.3 06/13/2019           Hypertension:  is being controlled with lisinopril 10mg  The dose was increased because of increased microalbumin which is still high Also on Invokana  Blood pressure recently at home 125/80  BP Readings from Last 3 Encounters:  05/26/19 124/76  05/08/19 122/76  05/03/19 128/78   Potassium is upper normal now, he says that he is not eating a lot of fruits like bananas and oranges but periodically has potatoes  Lab Results  Component Value Date   K 5.1 07/18/2019    Has eye exams regularly, last done on 04/05/2018   Most recent foot exam: 05/2018    LABS:  No visits with results within 1 Week(s) from this visit.  Latest known visit with results is:  Lab on 07/18/2019  Component Date Value Ref Range Status  . Fructosamine 07/18/2019 261  0 - 285 umol/L Final   Comment: Published reference interval for apparently healthy subjects between age 54 and 52 is 42 - 285 umol/L and in a poorly controlled diabetic population is 228 - 563 umol/L with a mean of 396 umol/L.   100 Glucose 07/18/2019  124* 65 - 99 mg/dL Final  . BUN 09/17/2019 15  8 - 27 mg/dL Final  . Creatinine, Ser 07/18/2019 0.78  0.76 - 1.27 mg/dL Final  . GFR calc non Af Amer 07/18/2019 91  >59 mL/min/1.73 Final  . GFR calc Af Amer 07/18/2019 105  >59 mL/min/1.73 Final  . BUN/Creatinine Ratio 07/18/2019 19  10 - 24 Final  . Sodium 07/18/2019 135  134 - 144 mmol/L Final  . Potassium 07/18/2019 5.1  3.5 - 5.2 mmol/L Final  . Chloride 07/18/2019 98  96 - 106 mmol/L Final  . CO2 07/18/2019 22  20 - 29 mmol/L Final  . Calcium 07/18/2019 9.8  8.6 - 10.2 mg/dL Final    Physical Examination:  There were no vitals taken for this visit.   ASSESSMENT:  Diabetes type 2, insulin-dependent  See history of present illness for detailed discussion of current diabetes management, blood sugar patterns and problems identified  His A1c is 7.3 as of 1/21 and fructosamine is now 261   He is on Invokamet and basal bolus insulin with NovoLog at dinner only  His blood sugars are fairly good at home in the mornings but difficult to know how often he is having postprandial hyperglycemia  MICROALBUMINURIA: This is slightly better but still significantly high His blood pressure is normal at home with increasing lisinopril to 10 mg However the dose of lisinopril is limited by his potassium being upper normal, he is also on Invokana at the same time  Hypertension: Mild and well-controlled  PLAN:    He will check readings after meals especially lunch much more than in the morning  Likely will need at least 6 units to cover his  lunch if he is eating a sandwich  Reassured him that mealtime insulin can be taken up to 3 times a day and discussed covering mealtime spikes and duration of action of rapid acting insulin  No change in [place]  Adjust suppertime dose of NovoLog based on his carbohydrate intake and 10 units may not be enough if eating more than 1 carbohydrate serving.    He will target after meal readings to at least  under 180 consistently  Recheck A1c on next visit  For his hypertension and increased microalbumin he will stay on 10 mg lisinopril However because of upper normal potassium he will need to have repeat labs done with PCP next month at his scheduled visit We will send message for her to consider switching to losartan or Avapro which may have less effects on potassium  There are no Patient Instructions on file for this visit.     Reather Littler 07/25/2019, 11:05 AM   Note: This office note was prepared with Dragon voice recognition system technology. Any transcriptional errors that result from this process are unintentional.

## 2019-08-08 DIAGNOSIS — H2513 Age-related nuclear cataract, bilateral: Secondary | ICD-10-CM | POA: Diagnosis not present

## 2019-08-08 LAB — HM DIABETES EYE EXAM

## 2019-08-10 NOTE — Progress Notes (Signed)
Opened in error. T. Saveon Plant, CMA 

## 2019-08-22 ENCOUNTER — Ambulatory Visit
Admission: RE | Admit: 2019-08-22 | Discharge: 2019-08-22 | Disposition: A | Discharge status: Home / Self Care | Payer: PPO | Source: Ambulatory Visit | Attending: Family Medicine | Admitting: Family Medicine

## 2019-08-22 DIAGNOSIS — Z122 Encounter for screening for malignant neoplasm of respiratory organs: Secondary | ICD-10-CM

## 2019-08-25 ENCOUNTER — Encounter: Payer: Self-pay | Admitting: Family Medicine

## 2019-08-29 ENCOUNTER — Other Ambulatory Visit: Payer: Self-pay | Admitting: Endocrinology

## 2019-10-26 ENCOUNTER — Other Ambulatory Visit: Payer: Self-pay | Admitting: Family Medicine

## 2019-10-26 DIAGNOSIS — E782 Mixed hyperlipidemia: Secondary | ICD-10-CM

## 2019-10-26 DIAGNOSIS — E1169 Type 2 diabetes mellitus with other specified complication: Secondary | ICD-10-CM

## 2019-10-26 DIAGNOSIS — E1159 Type 2 diabetes mellitus with other circulatory complications: Secondary | ICD-10-CM

## 2019-11-13 ENCOUNTER — Other Ambulatory Visit: Payer: Self-pay | Admitting: Family Medicine

## 2019-11-13 DIAGNOSIS — E1169 Type 2 diabetes mellitus with other specified complication: Secondary | ICD-10-CM

## 2019-11-13 DIAGNOSIS — E1159 Type 2 diabetes mellitus with other circulatory complications: Secondary | ICD-10-CM

## 2019-11-15 ENCOUNTER — Other Ambulatory Visit: Payer: Self-pay | Admitting: Physician Assistant

## 2019-11-15 DIAGNOSIS — E1169 Type 2 diabetes mellitus with other specified complication: Secondary | ICD-10-CM

## 2019-11-15 DIAGNOSIS — E1159 Type 2 diabetes mellitus with other circulatory complications: Secondary | ICD-10-CM

## 2019-11-15 DIAGNOSIS — E782 Mixed hyperlipidemia: Secondary | ICD-10-CM

## 2019-11-15 MED ORDER — LISINOPRIL 10 MG PO TABS: 10.0000 mg | ORAL_TABLET | Freq: Every day | ORAL | 0 refills | Status: DC

## 2019-11-15 MED ORDER — SIMVASTATIN 40 MG PO TABS: 40.0000 mg | ORAL_TABLET | Freq: Every day | ORAL | 0 refills | Status: DC

## 2019-11-15 NOTE — Telephone Encounter (Signed)
Sending #60 day refill to pharmacy. Patient needs apt for further refills per last OV with Opalski. Please call patient to schedule apt. AS, CMA

## 2019-11-15 NOTE — Telephone Encounter (Signed)
Pt called says pharmacy waiting on refill auth on these t(2)  Rxs  :   lisinopril (ZESTRIL) 10 MG tablet [[id number]]   Order Details Dose: 10 mg Route: Oral Frequency: Daily  Dispense Quantity: 90 tablet Refills: 1       Sig: Take 1 tablet (10 mg total) by mouth daily.   &   simvastatin (ZOCOR) 40 MG tablet [[id number]]   Order Details Dose, Route, Frequency: As Directed  Dispense Quantity: 90 tablet Refills: 1       Sig: 1 po q hs   --  Forwarding request to med asst to see order to :   [facility] [phone] [place], [place] [phone] AT [facility] [address] & [address]  [address] [place] [address]  Phone:  [phone] Fax:  [phone] ------glh

## 2019-11-23 ENCOUNTER — Other Ambulatory Visit: Payer: Self-pay

## 2019-11-23 ENCOUNTER — Other Ambulatory Visit: Payer: PPO

## 2019-11-23 DIAGNOSIS — E1165 Type 2 diabetes mellitus with hyperglycemia: Secondary | ICD-10-CM | POA: Diagnosis not present

## 2019-11-23 DIAGNOSIS — Z794 Long term (current) use of insulin: Secondary | ICD-10-CM

## 2019-11-23 NOTE — Addendum Note (Signed)
Addended by: Stan Head on: 11/23/2019 08:11 AM   Modules accepted: Orders

## 2019-11-24 LAB — COMPREHENSIVE METABOLIC PANEL
ALT: 24 IU/L (ref 0–44)
AST: 15 IU/L (ref 0–40)
Albumin/Globulin Ratio: 1.4 (ref 1.2–2.2)
Albumin: 4.5 g/dL (ref 3.7–4.7)
Alkaline Phosphatase: 77 IU/L (ref 48–121)
BUN/Creatinine Ratio: 24 (ref 10–24)
BUN: 20 mg/dL (ref 8–27)
Bilirubin Total: 0.5 mg/dL (ref 0.0–1.2)
CO2: 22 mmol/L (ref 20–29)
Calcium: 9.5 mg/dL (ref 8.6–10.2)
Chloride: 98 mmol/L (ref 96–106)
Creatinine, Ser: 0.83 mg/dL (ref 0.76–1.27)
GFR calc Af Amer: 102 mL/min/{1.73_m2} (ref >59)
GFR calc non Af Amer: 89 mL/min/{1.73_m2} (ref >59)
Globulin, Total: 3.2 g/dL (ref 1.5–4.5)
Glucose: 128 mg/dL — ABNORMAL HIGH (ref 65–99)
Potassium: 5 mmol/L (ref 3.5–5.2)
Sodium: 135 mmol/L (ref 134–144)
Total Protein: 7.7 g/dL (ref 6.0–8.5)

## 2019-11-24 LAB — MICROALBUMIN / CREATININE URINE RATIO
Creatinine, Urine: 37.5 mg/dL
Microalb/Creat Ratio: 98 mg/g creat — ABNORMAL HIGH (ref 0–29)
Microalbumin, Urine: 36.6 ug/mL

## 2019-11-24 LAB — HEMOGLOBIN A1C
Est. average glucose Bld gHb Est-mCnc: 177 mg/dL
Hgb A1c MFr Bld: 7.8 % — ABNORMAL HIGH (ref 4.8–5.6)

## 2019-11-30 ENCOUNTER — Ambulatory Visit: Payer: PPO | Admitting: Endocrinology

## 2019-12-06 ENCOUNTER — Other Ambulatory Visit: Payer: Self-pay | Admitting: Endocrinology

## 2019-12-19 ENCOUNTER — Ambulatory Visit: Payer: PPO | Admitting: Endocrinology

## 2019-12-19 ENCOUNTER — Other Ambulatory Visit: Payer: Self-pay

## 2019-12-19 ENCOUNTER — Encounter: Payer: Self-pay | Admitting: Endocrinology

## 2019-12-19 VITALS — BP 122/68 | HR 88 | Ht 71.0 in | Wt 213.6 lb

## 2019-12-19 DIAGNOSIS — E1165 Type 2 diabetes mellitus with hyperglycemia: Secondary | ICD-10-CM | POA: Diagnosis not present

## 2019-12-19 DIAGNOSIS — R809 Proteinuria, unspecified: Secondary | ICD-10-CM | POA: Diagnosis not present

## 2019-12-19 DIAGNOSIS — E1129 Type 2 diabetes mellitus with other diabetic kidney complication: Secondary | ICD-10-CM | POA: Diagnosis not present

## 2019-12-19 DIAGNOSIS — Z794 Long term (current) use of insulin: Secondary | ICD-10-CM

## 2019-12-19 DIAGNOSIS — I10 Essential (primary) hypertension: Secondary | ICD-10-CM | POA: Diagnosis not present

## 2019-12-19 NOTE — Progress Notes (Signed)
Patient ID: Jose Munoz, male   DOB: 12/11/1950, 69 y.o.   MRN: [id number]           Reason for Appointment:  Follow-up for Type 2 Diabetes  Referring physician: Opalski    History of Present Illness:          Date of diagnosis of type 2 diabetes mellitus: 1998       Background history:   He thinks his diabetes was relatively easier to control and the onset even though he had initial symptoms of thirst and weight loss He had done very well with his diet and lost a significant amount of weight and was approved to control his diabetes without medications for a few years Subsequently he started with oral medications including metformin, Actos and Amaryl He thinks his blood sugars were well controlled for a few years initially but started getting higher progressively since 2016. Because of poor control he was started on basal bolus insulin regimen on his initial consultation, highest A1c was 11.9 in 01/2016  Recent history:    INSULIN regimen: TRESIBA 40 units daily at bedtime, Novolog 10 units at dinner  Non-insulin hypoglycemic drugs the patient is taking are:Invokamet 150/1000 twice a day  Current management, blood sugar patterns and problems identified:  His A1c is higher at 7.8 compared to 7.3  He did not bring his meter  Also likely is getting high readings after meals since he does not take his insulin with him to cover lunch when he is mostly eating sandwiches  Also frequently will take his evening NovoLog about half hour after eating insulin before eating  His weight is up slightly  This is despite is trying to be little more active with some walking and playing golf        Side effects from medications have been: None  Compliance with the medical regimen: Fair  Glucose monitoring:  done less than 1 times a day         Glucometer:   One Touch Verio  Blood sugars by patient history:   PRE-MEAL Fasting Lunch Dinner Bedtime Overall  Glucose range: 107-135   ?    Mean/median:        Previous readings:  PRE-MEAL Fasting Lunch Dinner Bedtime Overall  Glucose range:  101-139      Mean/median:     132   POST-MEAL PC Breakfast PC Lunch PC Dinner  Glucose range:    147 177  Mean/median:        Self-care: The diet that the patient has been following is: tries to limit sweets and drinks with sugar .      Typical meal intake: Breakfast is egg, toast occasionally, sometimes skipped or; Lunch is a sandwich, has various snacks    Dinner 5-7 P.m.            Dietician visit, most recent: 06/2016 CDE visit: 11/17              Weight history:  Wt Readings from Last 3 Encounters:  12/19/19 213 lb 9.6 oz (96.9 kg)  05/26/19 211 lb (95.7 kg)  05/03/19 212 lb (96.2 kg)    Glycemic control:   Lab Results  Component Value Date   HGBA1C 7.8 (H) 11/23/2019   HGBA1C 7.3 (H) 06/13/2019   HGBA1C 7.4 (H) 04/11/2019   Lab Results  Component Value Date   MICROALBUR 4.3 (H) 08/11/2016   LDLCALC 64 06/13/2019   CREATININE 0.83 11/23/2019   Lab Results  Component  Value Date   MICRALBCREAT 98 (H) 11/23/2019       Allergies as of 12/19/2019   No Known Allergies     Medication List       Accurate as of December 19, 2019 10:41 AM. If you have any questions, ask your nurse or doctor.        ALPRAZolam 0.5 MG tablet Commonly known as: XANAX take 1 tablet by mouth if needed for anxiety   aspirin 81 MG tablet Take 81 mg by mouth daily.   BD Pen Needle Nano 2nd Gen 32G X 4 MM Misc Generic drug: Insulin Pen Needle USE TWICE DAILY   Insulin Aspart FlexPen 100 UNIT/ML Sopn INJECT 8-10 UNITS SUBCUTANEOUSLY BEFORE MAIN MEAL DAILY   Invokamet [phone] MG Tabs Generic drug: Canagliflozin-metFORMIN HCl TAKE 2 TABLETS BY MOUTH DAILY   lisinopril 10 MG tablet Commonly known as: ZESTRIL Take 1 tablet (10 mg total) by mouth daily. **PATIENT NEEDS APT FOR FURTHER REFILLS**   onetouch ultrasoft lancets Use to test blood sugar 2 times daily- Dx code  E11.9   OneTouch Verio test strip Generic drug: glucose blood TEST TWICE DAILY   simvastatin 40 MG tablet Commonly known as: ZOCOR Take 1 tablet (40 mg total) by mouth daily at 6 PM. **PATIENT NEEDS APT FOR FURTHER REFILLS**   Tyler Aas FlexTouch 200 UNIT/ML FlexTouch Pen Generic drug: insulin degludec Inject 40 Units into the skin daily.   Vitamin D (Ergocalciferol) 1.25 MG (50000 UNIT) Caps capsule Commonly known as: DRISDOL Take 1 capsule (50,000 Units total) by mouth every 7 (seven) days.       Allergies: No Known Allergies  Past Medical History:  Diagnosis Date  . Diabetes mellitus without complication ([place])   . Essential hypertension 10/16/2015  . Hyperlipidemia   . OA (osteoarthritis) of hip 11/05/2015  . Obese 10/16/2015  . Smoking   . Vitamin D deficiency 11/05/2015    No past surgical history on file.  Family History  Problem Relation Age of Onset  . Diabetes Mother   . Congestive Heart Failure Mother   . Cancer Sister        breast  . Cancer Sister        cervical  . Heart attack Brother     Social History:  reports that he has been smoking cigarettes. He has a 25.00 pack-year smoking history. He has never used smokeless tobacco. He reports current alcohol use of about 3.0 standard drinks of alcohol per week. He reports that he does not use drugs.   Review of Systems   Lipid history: Has good LDL levels with taking simvastatin 40 mg prescribed by his PCP HDL low but triglycerides are normal    Lab Results  Component Value Date   CHOL 119 06/13/2019   HDL 36 (L) 06/13/2019   LDLCALC 64 06/13/2019   TRIG 104 06/13/2019   CHOLHDL 3.3 06/13/2019           Hypertension:  is being controlled with lisinopril 33m The dose was increased because of increased microalbumin which is still high Also on Invokana  Blood pressure being checked at home also  BP Readings from Last 3 Encounters:  12/19/19 122/68  05/26/19 124/76  05/08/19 122/76    Potassium is upper normal again   Lab Results  Component Value Date   K 5.0 11/23/2019    Has eye exams regularly, last done 3/21   Most recent foot exam: 8/21    LABS:  No visits with  results within 1 Week(s) from this visit.  Latest known visit with results is:  Lab on 11/23/2019  Component Date Value Ref Range Status  . Glucose 11/23/2019 128* 65 - 99 mg/dL Final  . BUN 11/23/2019 20  8 - 27 mg/dL Final  . Creatinine, Ser 11/23/2019 0.83  0.76 - 1.27 mg/dL Final  . GFR calc non Af Amer 11/23/2019 89  >59 mL/min/1.73 Final  . GFR calc Af Amer 11/23/2019 102  >59 mL/min/1.73 Final   Comment: **[facility] currently reports eGFR in compliance with the current**   recommendations of the [employer]. [facility] will   update reporting as new guidelines are published from the NKF-ASN   Task force.   . BUN/Creatinine Ratio 11/23/2019 24  10 - 24 Final  . Sodium 11/23/2019 135  134 - 144 mmol/L Final  . Potassium 11/23/2019 5.0  3.5 - 5.2 mmol/L Final  . Chloride 11/23/2019 98  96 - 106 mmol/L Final  . CO2 11/23/2019 22  20 - 29 mmol/L Final  . Calcium 11/23/2019 9.5  8.6 - 10.2 mg/dL Final  . Total Protein 11/23/2019 7.7  6.0 - 8.5 g/dL Final  . Albumin 11/23/2019 4.5  3.7 - 4.7 g/dL Final  . Globulin, Total 11/23/2019 3.2  1.5 - 4.5 g/dL Final  . Albumin/Globulin Ratio 11/23/2019 1.4  1.2 - 2.2 Final  . Bilirubin Total 11/23/2019 0.5  0.0 - 1.2 mg/dL Final  . Alkaline Phosphatase 11/23/2019 77  48 - 121 IU/L Final  . AST 11/23/2019 15  0 - 40 IU/L Final  . ALT 11/23/2019 24  0 - 44 IU/L Final  . Hgb A1c MFr Bld 11/23/2019 7.8* 4.8 - 5.6 % Final   Comment:          Prediabetes: 5.7 - 6.4          Diabetes: >6.4          Glycemic control for adults with diabetes: <7.0   . Est. average glucose Bld gHb Est-m* 11/23/2019 177  mg/dL Final  . Creatinine, Urine 11/23/2019 37.5  Not Estab. mg/dL Final  . Microalbumin, Urine 11/23/2019 36.6  Not Estab. ug/mL Final   . Microalb/Creat Ratio 11/23/2019 98* 0 - 29 mg/g creat Final   Comment:                        Normal:                0 -  29                        Moderately increased: 30 - 300                        Severely increased:       >300     Physical Examination:  BP 122/68 (BP Location: Left Arm, Patient Position: Sitting, Cuff Size: Large)   Pulse 88   Ht 5' 11"  (1.803 m)   Wt 213 lb 9.6 oz (96.9 kg)   SpO2 95%   BMI 29.79 kg/m   Diabetic Foot Exam - Simple   Simple Foot Form Diabetic Foot exam was performed with the following findings: Yes   Visual Inspection No deformities, no ulcerations, no other skin breakdown bilaterally: Yes Sensation Testing Intact to touch and monofilament testing bilaterally: Yes Pulse Check Posterior Tibialis and Dorsalis pulse intact bilaterally: Yes See comments: Yes Comments Dorsalis  pedis pulses not well palpated     ASSESSMENT:  Diabetes type 2, insulin-dependent  See history of present illness for detailed discussion of current diabetes management, blood sugar patterns and problems identified  His A1c is 7.8  Discussed with the patient that he has postprandial hyperglycemia causing his A1c to be consistently over 7% and higher recently He is on Invokamet and basal bolus insulin with NovoLog at dinner only He likely needs NovoLog to cover his lunch but he has not done this as he forgets Also timing of evening insulin is usually late  His fasting blood sugars are fairly good at home in the mornings but forgets to check after meals and did not bring his meter today also  MICROALBUMINURIA: This is not significantly improved at 98 Probably can do better with diabetes control He is already on lisinopril and Invokana  Hypertension: Mild and well-controlled  PLAN:    He will consider using the V-go pump.  Discussed how this works and showed him a sample pump  Currently is not interested in this, I have given him the brochure and he  will think about it  He says that he thinks he can try and do better with taking mealtime insulin before eating and also covering lunch with taking his NovoLog to his restaurant meals  No change in [place] as yet  Also would like to have him work more on getting his weight down  Recheck A1c on next visit  For his hypertension and increased microalbumin he will stay on 10 mg lisinopril, currently prescribed by his PCP Need to continue monitoring potassium which is still upper normal and stable May consider switching to Avapro or Benicar if potassium is higher   There are no Patient Instructions on file for this visit.     Elayne Snare 12/19/2019, 10:41 AM   Note: This office note was prepared with Dragon voice recognition system technology. Any transcriptional errors that result from this process are unintentional.

## 2020-01-09 ENCOUNTER — Telehealth: Payer: Self-pay | Admitting: Physician Assistant

## 2020-01-09 ENCOUNTER — Other Ambulatory Visit: Payer: Self-pay | Admitting: Physician Assistant

## 2020-01-09 DIAGNOSIS — E1169 Type 2 diabetes mellitus with other specified complication: Secondary | ICD-10-CM

## 2020-01-09 DIAGNOSIS — E1159 Type 2 diabetes mellitus with other circulatory complications: Secondary | ICD-10-CM

## 2020-01-09 DIAGNOSIS — E782 Mixed hyperlipidemia: Secondary | ICD-10-CM

## 2020-01-09 DIAGNOSIS — I152 Hypertension secondary to endocrine disorders: Secondary | ICD-10-CM

## 2020-01-09 MED ORDER — LISINOPRIL 10 MG PO TABS: 10.0000 mg | ORAL_TABLET | Freq: Every day | ORAL | 0 refills | Status: DC

## 2020-01-09 MED ORDER — SIMVASTATIN 40 MG PO TABS: 40.0000 mg | ORAL_TABLET | Freq: Every day | ORAL | 0 refills | Status: DC

## 2020-01-09 NOTE — Addendum Note (Signed)
Addended by: Sylvester Harder on: 01/09/2020 10:32 AM   Modules accepted: Orders

## 2020-01-09 NOTE — Telephone Encounter (Signed)
Refill sent to requested pharmacy. AS, CMA 

## 2020-01-09 NOTE — Telephone Encounter (Signed)
Patient is aware he is overdue for a med f/u and is schedule in the near future, he is currently out of his simvastatin and lisinopril, he is requesting a refill to be sent to his [facility] on [address]

## 2020-01-11 ENCOUNTER — Ambulatory Visit (INDEPENDENT_AMBULATORY_CARE_PROVIDER_SITE_OTHER): Payer: PPO | Admitting: Physician Assistant

## 2020-01-11 ENCOUNTER — Other Ambulatory Visit: Payer: Self-pay

## 2020-01-11 ENCOUNTER — Encounter: Payer: Self-pay | Admitting: Physician Assistant

## 2020-01-11 VITALS — BP 106/69 | HR 86 | Temp 97.6°F | Ht 71.0 in | Wt 214.4 lb

## 2020-01-11 DIAGNOSIS — E1169 Type 2 diabetes mellitus with other specified complication: Secondary | ICD-10-CM | POA: Diagnosis not present

## 2020-01-11 DIAGNOSIS — E781 Pure hyperglyceridemia: Secondary | ICD-10-CM

## 2020-01-11 DIAGNOSIS — I10 Essential (primary) hypertension: Secondary | ICD-10-CM

## 2020-01-11 DIAGNOSIS — I152 Hypertension secondary to endocrine disorders: Secondary | ICD-10-CM

## 2020-01-11 DIAGNOSIS — E782 Mixed hyperlipidemia: Secondary | ICD-10-CM

## 2020-01-11 DIAGNOSIS — E1159 Type 2 diabetes mellitus with other circulatory complications: Secondary | ICD-10-CM | POA: Diagnosis not present

## 2020-01-11 DIAGNOSIS — H6123 Impacted cerumen, bilateral: Secondary | ICD-10-CM | POA: Diagnosis not present

## 2020-01-11 NOTE — Patient Instructions (Signed)

## 2020-01-11 NOTE — Progress Notes (Signed)
Established Patient Office Visit  Subjective:  Patient ID: Jose Munoz, male    DOB: 11-08-1950  Age: 69 y.o. MRN: [id number]  CC:  Chief Complaint  Patient presents with  . Hypertension  . Diabetes    HPI Jose Munoz presents for follow-up on hypertension and diabetes mellitus. He wants his ears checked because he feels like they are clogged, L>R.  HTN: Pt denies chest pain, palpitations, dizziness or lower extremity swelling. Taking medication as directed without side effects. Checks BP at home and readings range in 110s/70s.   Diabetes mellitus: Followed by Endocrinology. Denies increased thirst or urination. Checks FBS which range in 120-130s and postprandial glucose varies depending what he eats and range in 200s. Reports medication compliance.   Past Medical History:  Diagnosis Date  . Diabetes mellitus without complication (HCC)   . Essential hypertension 10/16/2015  . Hyperlipidemia   . OA (osteoarthritis) of hip 11/05/2015  . Obese 10/16/2015  . Smoking   . Vitamin D deficiency 11/05/2015    History reviewed. No pertinent surgical history.  Family History  Problem Relation Age of Onset  . Diabetes Mother   . Congestive Heart Failure Mother   . Cancer Sister        breast  . Cancer Sister        cervical  . Heart attack Brother     Social History   Socioeconomic History  . Marital status: Widowed    Spouse name: Not on file  . Number of children: Not on file  . Years of education: Not on file  . Highest education level: Not on file  Occupational History  . Not on file  Tobacco Use  . Smoking status: Current Every Day Smoker    Packs/day: 1.00    Years: 25.00    Pack years: 25.00    Types: Cigarettes  . Smokeless tobacco: Never Used  Substance and Sexual Activity  . Alcohol use: Yes    Alcohol/week: 3.0 standard drinks    Types: 1 Glasses of wine, 2 Shots of liquor per week  . Drug use: No  . Sexual activity: Never  Other Topics Concern   . Not on file  Social History Narrative  . Not on file   Social Determinants of Health   Financial Resource Strain:   . Difficulty of Paying Living Expenses: Not on file  Food Insecurity:   . Worried About Programme researcher, broadcasting/film/video in the Last Year: Not on file  . Ran Out of Food in the Last Year: Not on file  Transportation Needs:   . Lack of Transportation (Medical): Not on file  . Lack of Transportation (Non-Medical): Not on file  Physical Activity:   . Days of Exercise per Week: Not on file  . Minutes of Exercise per Session: Not on file  Stress:   . Feeling of Stress : Not on file  Social Connections:   . Frequency of Communication with Friends and Family: Not on file  . Frequency of Social Gatherings with Friends and Family: Not on file  . Attends Religious Services: Not on file  . Active Member of Clubs or Organizations: Not on file  . Attends Banker Meetings: Not on file  . Marital Status: Not on file  Intimate Partner Violence:   . Fear of Current or Ex-Partner: Not on file  . Emotionally Abused: Not on file  . Physically Abused: Not on file  . Sexually Abused: Not on file  Outpatient Medications Prior to Visit  Medication Sig Dispense Refill  . ALPRAZolam (XANAX) 0.5 MG tablet take 1 tablet by mouth if needed for anxiety 30 tablet 0  . aspirin 81 MG tablet Take 81 mg by mouth daily.    . BD PEN NEEDLE NANO 2ND GEN 32G X 4 MM MISC USE TWICE DAILY 100 each 4  . Insulin Aspart FlexPen 100 UNIT/ML SOPN INJECT 8-10 UNITS SUBCUTANEOUSLY BEFORE MAIN MEAL DAILY 15 mL 1  . INVOKAMET [id number] MG TABS TAKE 2 TABLETS BY MOUTH DAILY 180 tablet 0  . Lancets (ONETOUCH ULTRASOFT) lancets Use to test blood sugar 2 times daily- Dx code E11.9 100 each 12  . lisinopril (ZESTRIL) 10 MG tablet Take 1 tablet (10 mg total) by mouth daily. **PATIENT NEEDS APT FOR FURTHER REFILLS** 30 tablet 0  . ONETOUCH VERIO test strip TEST TWICE DAILY 100 each 5  . simvastatin (ZOCOR) 40 MG  tablet Take 1 tablet (40 mg total) by mouth daily at 6 PM. **PATIENT NEEDS APT FOR FURTHER REFILLS** 30 tablet 0  . Vitamin D, Ergocalciferol, (DRISDOL) 1.25 MG (50000 UT) CAPS capsule Take 1 capsule (50,000 Units total) by mouth every 7 (seven) days. 12 capsule 4  . Insulin Degludec (TRESIBA FLEXTOUCH) 200 UNIT/ML SOPN Inject 40 Units into the skin daily. 15 mL 2   No facility-administered medications prior to visit.    No Known Allergies  ROS Review of Systems  A fourteen system review of systems was performed and found to be positive as per HPI.  Objective:    Physical Exam General:  Well Developed, well nourished, appropriate for stated age.  Neuro:  Alert and oriented,  extra-ocular muscles intact  HEENT:  Normocephalic, atraumatic, neck supple Skin:  no gross rash, warm, pink. Cardiac:  RRR, S1 S2 Respiratory:  ECTA B/L and A/P, Not using accessory muscles, speaking in full sentences- unlabored. Vascular:  Ext warm, no cyanosis apprec.; cap RF less 2 sec. Psych:  No HI/SI, judgement and insight good, Euthymic mood. Full Affect.   BP 106/69   Pulse 86   Temp 97.6 F (36.4 C) (Oral)   Ht 5\' 11"  (1.803 m)   Wt 214 lb 6.4 oz (97.3 kg)   SpO2 96%   BMI 29.90 kg/m  Wt Readings from Last 3 Encounters:  01/11/20 214 lb 6.4 oz (97.3 kg)  12/19/19 213 lb 9.6 oz (96.9 kg)  05/26/19 211 lb (95.7 kg)     Health Maintenance Due  Topic Date Due  . COLONOSCOPY  Never done  . INFLUENZA VACCINE  12/17/2019    There are no preventive care reminders to display for this patient.  Lab Results  Component Value Date   TSH 1.950 06/13/2019   Lab Results  Component Value Date   WBC 10.2 06/13/2019   HGB 17.3 06/13/2019   HCT 51.2 (H) 06/13/2019   MCV 90 06/13/2019   PLT 330 06/13/2019   Lab Results  Component Value Date   NA 135 11/23/2019   K 5.0 11/23/2019   CO2 22 11/23/2019   GLUCOSE 128 (H) 11/23/2019   BUN 20 11/23/2019   CREATININE 0.83 11/23/2019   BILITOT  0.5 11/23/2019   ALKPHOS 77 11/23/2019   AST 15 11/23/2019   ALT 24 11/23/2019   PROT 7.7 11/23/2019   ALBUMIN 4.5 11/23/2019   CALCIUM 9.5 11/23/2019   GFR 113.09 12/02/2017   Lab Results  Component Value Date   CHOL 119 06/13/2019   Lab Results  Component Value Date   HDL 36 (L) 06/13/2019   Lab Results  Component Value Date   LDLCALC 64 06/13/2019   Lab Results  Component Value Date   TRIG 104 06/13/2019   Lab Results  Component Value Date   CHOLHDL 3.3 06/13/2019   Lab Results  Component Value Date   HGBA1C 7.8 (H) 11/23/2019      Assessment & Plan:   Problem List Items Addressed This Visit      Cardiovascular and Mediastinum   Hypertension associated with diabetes (HCC)     Endocrine   Mixed diabetic hyperlipidemia associated with type 2 diabetes mellitus (HCC)    Other Visit Diagnoses    Type 2 diabetes mellitus with hypertriglyceridemia (HCC)    -  Primary   Bilateral impacted cerumen         Type 2 diabetes mellitus: -Followed by Endocrinology. -Last A1c 7.8 -Continue current medication regimen. -Follow low carbohydrate and glucose diet.  Hypertension associated with diabetes: -BP stable -Continue current medication regimen. -Follow low sodium diet. -Last CMP stable, no electrolyte abnormalities   Mixed diabetic hyperlipidemia: -Last lipid panel wnl with exception of low HDL -Continue Simvastatin -Follow a heart healthy diet. -Plan to recheck lipid panel next OV.    Indication: Cerumen impaction of both ear(s) Medical necessity statement:  On physical examination, cerumen impairs clinically significant portions of the external auditory canal, and tympanic membrane.   Consent:  Discussed benefits and risks of procedure and verbal consent obtained Procedure:   Patient was prepped for the procedure.  Utilized an otoscope to assess and take note of the ear canal, the tympanic membrane, and the presence, amount, and placement of the cerumen.  Gentle water irrigation and soft plastic curette was utilized to remove cerumen. Post procedure examination:  shows cerumen was removed, without trauma or injury to the ear canal or TM, which remains intact.   Post-Procedural Ear Care Instructions:    Patient tolerated procedure well.  Proper ear care d/c pt.   The patient is made aware that they may experience temporary vertigo, temporary hearing loss, and temporary discomfort.  If these symptom last for more than 24 hours to call the clinic or proceed to the ED/Urgent Care.   No orders of the defined types were placed in this encounter.   Follow-up: Return in about 4 months (around 05/12/2020) for HTN, HLD, DM.   Note:  This note was prepared with assistance of Dragon voice recognition software. Occasional wrong-word or sound-a-like substitutions may have occurred due to the inherent limitations of voice recognition software.  Mayer Masker, PA-C

## 2020-01-16 ENCOUNTER — Other Ambulatory Visit: Payer: Self-pay | Admitting: Endocrinology

## 2020-01-28 ENCOUNTER — Other Ambulatory Visit: Payer: Self-pay | Admitting: Endocrinology

## 2020-02-06 ENCOUNTER — Other Ambulatory Visit: Payer: Self-pay | Admitting: Physician Assistant

## 2020-02-06 DIAGNOSIS — E782 Mixed hyperlipidemia: Secondary | ICD-10-CM

## 2020-02-06 DIAGNOSIS — I152 Hypertension secondary to endocrine disorders: Secondary | ICD-10-CM

## 2020-02-06 DIAGNOSIS — E1169 Type 2 diabetes mellitus with other specified complication: Secondary | ICD-10-CM

## 2020-03-08 ENCOUNTER — Other Ambulatory Visit: Payer: Self-pay | Admitting: Endocrinology

## 2020-03-19 ENCOUNTER — Other Ambulatory Visit: Payer: Self-pay | Admitting: Endocrinology

## 2020-03-20 ENCOUNTER — Other Ambulatory Visit: Payer: Self-pay

## 2020-03-20 ENCOUNTER — Other Ambulatory Visit: Payer: PPO

## 2020-03-20 DIAGNOSIS — E1165 Type 2 diabetes mellitus with hyperglycemia: Secondary | ICD-10-CM | POA: Diagnosis not present

## 2020-03-20 DIAGNOSIS — Z794 Long term (current) use of insulin: Secondary | ICD-10-CM | POA: Diagnosis not present

## 2020-03-20 NOTE — Addendum Note (Signed)
Addended by: Stan Head on: 03/20/2020 09:23 AM   Modules accepted: Orders

## 2020-03-21 LAB — BASIC METABOLIC PANEL
BUN/Creatinine Ratio: 21 (ref 10–24)
BUN: 16 mg/dL (ref 8–27)
CO2: 22 mmol/L (ref 20–29)
Calcium: 9.4 mg/dL (ref 8.6–10.2)
Chloride: 99 mmol/L (ref 96–106)
Creatinine, Ser: 0.76 mg/dL (ref 0.76–1.27)
GFR calc Af Amer: 106 mL/min/{1.73_m2} (ref >59)
GFR calc non Af Amer: 92 mL/min/{1.73_m2} (ref >59)
Glucose: 132 mg/dL — ABNORMAL HIGH (ref 65–99)
Potassium: 4.9 mmol/L (ref 3.5–5.2)
Sodium: 138 mmol/L (ref 134–144)

## 2020-03-21 LAB — HEMOGLOBIN A1C
Est. average glucose Bld gHb Est-mCnc: 174 mg/dL
Hgb A1c MFr Bld: 7.7 % — ABNORMAL HIGH (ref 4.8–5.6)

## 2020-03-28 ENCOUNTER — Ambulatory Visit: Payer: PPO | Admitting: Endocrinology

## 2020-04-01 NOTE — Progress Notes (Signed)
Patient ID: Jose Munoz, male   DOB: 02/09/1951, 69 y.o.   MRN: [id number]           Reason for Appointment:  Follow-up for Type 2 Diabetes   History of Present Illness:          Date of diagnosis of type 2 diabetes mellitus: 1998       Background history:   He thinks his diabetes was relatively easier to control and the onset even though he had initial symptoms of thirst and weight loss He had done very well with his diet and lost a significant amount of weight and was approved to control his diabetes without medications for a few years Subsequently he started with oral medications including metformin, Actos and Amaryl He thinks his blood sugars were well controlled for a few years initially but started getting higher progressively since 2016. Because of poor control he was started on basal bolus insulin regimen on his initial consultation, highest A1c was 11.9 in 01/2016  Recent history:    INSULIN regimen: TRESIBA 40 units daily at bedtime, Novolog 10 units at dinner  Non-insulin hypoglycemic drugs the patient is taking are:Invokamet 150/1000 twice a day  Current management, blood sugar patterns and problems identified:  His A1c is still relatively high at 7.7, previously was at 7.8   He did not check readings after meals much at all and only 2 times in the evening after dinner but none after breakfast or lunch  He is now sometimes eating breakfast which includes carbohydrates like biscuits today and also sometimes toast or grits  Sometimes he will not take insulin with him to cover his lunch and no readings in afternoon  He has couple of readings that are fairly good after dinner  Recently has not done any exercise since he is not playing golf  Also cannot walk much because of hip pain  Weight is slightly higher        Side effects from medications have been: None  Compliance with the medical regimen: Fair  Glucose monitoring:  done less than 1 times a day          Glucometer:   One Touch Verio  Blood sugars by download  FASTING range 122-160, average 140 After dinner 148, 155  Previously  PRE-MEAL Fasting Lunch Dinner Bedtime Overall  Glucose range: 107-135   ?   Mean/median:          Self-care: The diet that the patient has been following is: tries to limit sweets and drinks with sugar .      Typical meal intake: Breakfast is egg, toast occasionally, sometimes skipped or; Lunch is a sandwich, has various snacks    Dinner 5-7 P.m.            Dietician visit, most recent: 06/2016 CDE visit: 11/17              Weight history:  Wt Readings from Last 3 Encounters:  04/02/20 218 lb 8 oz (99.1 kg)  01/11/20 214 lb 6.4 oz (97.3 kg)  12/19/19 213 lb 9.6 oz (96.9 kg)    Glycemic control:   Lab Results  Component Value Date   HGBA1C 7.7 (H) 03/20/2020   HGBA1C 7.8 (H) 11/23/2019   HGBA1C 7.3 (H) 06/13/2019   Lab Results  Component Value Date   MICROALBUR 4.3 (H) 08/11/2016   LDLCALC 64 06/13/2019   CREATININE 0.76 03/20/2020   Lab Results  Component Value Date   MICRALBCREAT 98 (H)  11/23/2019       Allergies as of 04/02/2020   No Known Allergies     Medication List       Accurate as of April 02, 2020 12:03 PM. If you have any questions, ask your nurse or doctor.        ALPRAZolam 0.5 MG tablet Commonly known as: XANAX take 1 tablet by mouth if needed for anxiety   aspirin 81 MG tablet Take 81 mg by mouth daily.   BD Pen Needle Nano 2nd Gen 32G X 4 MM Misc Generic drug: Insulin Pen Needle USE TWICE DAILY   Insulin Aspart FlexPen 100 UNIT/ML Sopn INJECY 8-10 UNITS UNDER THE SKIN BEFORE MAIN MEAL DAILY   Invokamet [id number] MG Tabs Generic drug: Canagliflozin-metFORMIN HCl TAKE 2 TABLETS BY MOUTH DAILY   lisinopril 10 MG tablet Commonly known as: ZESTRIL TAKE 1 TABLET(10 MG) BY MOUTH DAILY   onetouch ultrasoft lancets Use to test blood sugar 2 times daily- Dx code E11.9   OneTouch Verio test  strip Generic drug: glucose blood TEST TWICE DAILY   simvastatin 40 MG tablet Commonly known as: ZOCOR TAKE 1 TABLET(40 MG) BY MOUTH DAILY AT 6 PM   Tresiba FlexTouch 200 UNIT/ML FlexTouch Pen Generic drug: insulin degludec ADMINISTER 40 UNITS UNDER THE SKIN DAILY   Vitamin D (Ergocalciferol) 1.25 MG (50000 UNIT) Caps capsule Commonly known as: DRISDOL Take 1 capsule (50,000 Units total) by mouth every 7 (seven) days.       Allergies: No Known Allergies  Past Medical History:  Diagnosis Date  . Diabetes mellitus without complication (HCC)   . Essential hypertension 10/16/2015  . Hyperlipidemia   . OA (osteoarthritis) of hip 11/05/2015  . Obese 10/16/2015  . Smoking   . Vitamin D deficiency 11/05/2015    No past surgical history on file.  Family History  Problem Relation Age of Onset  . Diabetes Mother   . Congestive Heart Failure Mother   . Cancer Sister        breast  . Cancer Sister        cervical  . Heart attack Brother     Social History:  reports that he has been smoking cigarettes. He has a 25.00 pack-year smoking history. He has never used smokeless tobacco. He reports current alcohol use of about 3.0 standard drinks of alcohol per week. He reports that he does not use drugs.   Review of Systems   Lipid history: Has good LDL levels with taking simvastatin 40 mg prescribed by his PCP HDL low but triglycerides are normal    Lab Results  Component Value Date   CHOL 119 06/13/2019   HDL 36 (L) 06/13/2019   LDLCALC 64 06/13/2019   TRIG 104 06/13/2019   CHOLHDL 3.3 06/13/2019           Hypertension:  is being controlled with lisinopril 10mg  The dose was increased because of increased microalbumin which is still high Also on Invokana  Blood pressure being checked at home also: 126/75  BP Readings from Last 3 Encounters:  04/02/20 122/76  01/11/20 106/69  12/19/19 122/68   Potassium is upper normal     Lab Results  Component Value Date   K  4.9 03/20/2020    Has eye exams regularly, last done 3/21   Most recent foot exam: 8/21    LABS:  Office Visit on 04/02/2020  Component Date Value Ref Range Status  . POC Glucose 04/02/2020 227* 70 - 99 mg/dl Final  Physical Examination:  BP 122/76   Pulse 95   Ht 5\' 11"  (1.803 m)   Wt 218 lb 8 oz (99.1 kg)   SpO2 95%   BMI 30.47 kg/m     ASSESSMENT:  Diabetes type 2, insulin-dependent  See history of present illness for detailed discussion of current diabetes management, blood sugar patterns and problems identified  His A1c is 7.7  He is still not checking blood sugars enough especially after meals He was given information and discussed advantages of using the continuous glucose monitoring system with the freestyle libre but he wants to think about it In the meantime he needs to check his sugars consistently after various meals including breakfast He does need to exercise  MICROALBUMINURIA: On the last visit was stable at 98, we will continue to monitor this  Hypertension: On lisinopril 10 mg, well-controlled and also monitoring at home Renal function is stable even with providing with Invokana  PLAN:    He will consider using the freestyle libre sensor and will let know when he is ready to start  He will need to start exercising either with water aerobics or go to the senior center for exercise bike  Increase Tresiba by 2 units and go to 42 units  He will take at least 5 units for breakfast and adjust further if postprandial readings are still over 180  He can adjust his suppertime dose up or down 2 units based on his meal size and carbohydrate intake  He will try to take his NovoLog with him when he is eating out at lunch  Likely needs to cut down some overall calorie intake because of weight gain  No change in blood pressure medication Flu vaccine given  Patient Instructions  Check blood sugars on waking up 3 days a week  Also check blood  sugars about 2 hours after meals and do this after different meals by rotation  Recommended blood sugar levels on waking up are 90-130 and about 2 hours after meal is 130-160  Please bring your blood sugar monitor to each visit, thank you  Water exercises. Exercise  Tresiba 42 units  Novolog 5-8 U at [place] and lunch       [employer] 04/02/2020, 12:03 PM   Note: This office note was prepared with Dragon voice recognition system technology. Any transcriptional errors that result from this process are unintentional.

## 2020-04-02 ENCOUNTER — Encounter: Payer: Self-pay | Admitting: Endocrinology

## 2020-04-02 ENCOUNTER — Ambulatory Visit: Payer: PPO | Admitting: Endocrinology

## 2020-04-02 VITALS — BP 122/76 | HR 95 | Ht 71.0 in | Wt 218.5 lb

## 2020-04-02 DIAGNOSIS — Z23 Encounter for immunization: Secondary | ICD-10-CM

## 2020-04-02 DIAGNOSIS — E1165 Type 2 diabetes mellitus with hyperglycemia: Secondary | ICD-10-CM

## 2020-04-02 DIAGNOSIS — I10 Essential (primary) hypertension: Secondary | ICD-10-CM | POA: Diagnosis not present

## 2020-04-02 DIAGNOSIS — Z794 Long term (current) use of insulin: Secondary | ICD-10-CM

## 2020-04-02 DIAGNOSIS — E782 Mixed hyperlipidemia: Secondary | ICD-10-CM

## 2020-04-02 LAB — GLUCOSE, POCT (MANUAL RESULT ENTRY): POC Glucose: 227 mg/dl — AB (ref 70–99)

## 2020-04-02 NOTE — Patient Instructions (Addendum)
Check blood sugars on waking up 3 days a week  Also check blood sugars about 2 hours after meals and do this after different meals by rotation  Recommended blood sugar levels on waking up are 90-130 and about 2 hours after meal is 130-160  Please bring your blood sugar monitor to each visit, thank you  Water exercises. Exercise  Tresiba 42 units  Novolog 5-8 U at [employer] and lunch

## 2020-05-04 ENCOUNTER — Other Ambulatory Visit: Payer: Self-pay | Admitting: Physician Assistant

## 2020-05-04 DIAGNOSIS — E1159 Type 2 diabetes mellitus with other circulatory complications: Secondary | ICD-10-CM

## 2020-05-04 DIAGNOSIS — E1169 Type 2 diabetes mellitus with other specified complication: Secondary | ICD-10-CM

## 2020-05-04 DIAGNOSIS — E781 Pure hyperglyceridemia: Secondary | ICD-10-CM

## 2020-05-16 DIAGNOSIS — H0014 Chalazion left upper eyelid: Secondary | ICD-10-CM | POA: Diagnosis not present

## 2020-06-05 ENCOUNTER — Other Ambulatory Visit: Payer: Self-pay | Admitting: Endocrinology

## 2020-06-20 ENCOUNTER — Other Ambulatory Visit: Payer: Self-pay | Admitting: Family Medicine

## 2020-06-20 DIAGNOSIS — E559 Vitamin D deficiency, unspecified: Secondary | ICD-10-CM

## 2020-06-26 DIAGNOSIS — H2513 Age-related nuclear cataract, bilateral: Secondary | ICD-10-CM | POA: Diagnosis not present

## 2020-06-26 LAB — HM DIABETES EYE EXAM

## 2020-07-01 ENCOUNTER — Telehealth: Payer: Self-pay | Admitting: Endocrinology

## 2020-07-01 ENCOUNTER — Other Ambulatory Visit: Payer: Self-pay | Admitting: *Deleted

## 2020-07-01 MED ORDER — ONETOUCH VERIO VI STRP
ORAL_STRIP | 5 refills | Status: DC
Start: 2020-07-01 — End: 2022-07-20

## 2020-07-01 NOTE — Telephone Encounter (Signed)
Rx sent 

## 2020-07-01 NOTE — Telephone Encounter (Signed)
Patient called to request new RX with refills for   [facility] VERIO test strip  Be sent to   [facility] [phone] [place], [place] - [address] AT [facility] [address] & [facility] Phone:  [phone]  Fax:  [phone]

## 2020-07-02 ENCOUNTER — Other Ambulatory Visit: Payer: Self-pay

## 2020-07-02 ENCOUNTER — Other Ambulatory Visit: Payer: PPO

## 2020-07-02 DIAGNOSIS — E1165 Type 2 diabetes mellitus with hyperglycemia: Secondary | ICD-10-CM

## 2020-07-02 DIAGNOSIS — Z794 Long term (current) use of insulin: Secondary | ICD-10-CM

## 2020-07-02 DIAGNOSIS — E782 Mixed hyperlipidemia: Secondary | ICD-10-CM | POA: Diagnosis not present

## 2020-07-02 NOTE — Addendum Note (Signed)
Addended by: Sylvester Harder on: 07/02/2020 09:09 AM   Modules accepted: Orders

## 2020-07-03 LAB — BASIC METABOLIC PANEL
BUN/Creatinine Ratio: 15 (ref 10–24)
BUN: 12 mg/dL (ref 8–27)
CO2: 24 mmol/L (ref 20–29)
Calcium: 9.7 mg/dL (ref 8.6–10.2)
Chloride: 101 mmol/L (ref 96–106)
Creatinine, Ser: 0.78 mg/dL (ref 0.76–1.27)
GFR calc Af Amer: 104 mL/min/{1.73_m2} (ref >59)
GFR calc non Af Amer: 90 mL/min/{1.73_m2} (ref >59)
Glucose: 97 mg/dL (ref 65–99)
Potassium: 5 mmol/L (ref 3.5–5.2)
Sodium: 139 mmol/L (ref 134–144)

## 2020-07-03 LAB — MICROALBUMIN / CREATININE URINE RATIO
Creatinine, Urine: 75.4 mg/dL
Microalb/Creat Ratio: 88 mg/g creat — ABNORMAL HIGH (ref 0–29)
Microalbumin, Urine: 66 ug/mL

## 2020-07-03 LAB — LIPID PANEL
Chol/HDL Ratio: 3.4 ratio (ref 0.0–5.0)
Cholesterol, Total: 108 mg/dL (ref 100–199)
HDL: 32 mg/dL — ABNORMAL LOW (ref >39)
LDL Chol Calc (NIH): 55 mg/dL (ref 0–99)
Triglycerides: 115 mg/dL (ref 0–149)
VLDL Cholesterol Cal: 21 mg/dL (ref 5–40)

## 2020-07-03 LAB — HEMOGLOBIN A1C
Est. average glucose Bld gHb Est-mCnc: 151 mg/dL
Hgb A1c MFr Bld: 6.9 % — ABNORMAL HIGH (ref 4.8–5.6)

## 2020-07-04 ENCOUNTER — Encounter: Payer: Self-pay | Admitting: Physician Assistant

## 2020-07-09 NOTE — Progress Notes (Signed)
Patient ID: Jose Munoz, male   DOB: 11-28-1950, 70 y.o.   MRN: [id number]           Reason for Appointment:  Follow-up for Type 2 Diabetes   History of Present Illness:          Date of diagnosis of type 2 diabetes mellitus: 1998       Background history:   He thinks his diabetes was relatively easier to control and the onset even though he had initial symptoms of thirst and weight loss He had done very well with his diet and lost a significant amount of weight and was approved to control his diabetes without medications for a few years Subsequently he started with oral medications including metformin, Actos and Amaryl He thinks his blood sugars were well controlled for a few years initially but started getting higher progressively since 2016. Because of poor control he was started on basal bolus insulin regimen on his initial consultation, highest A1c was 11.9 in 01/2016  Recent history:    INSULIN regimen: TRESIBA 42 units daily at bedtime, Novolog 0-8 at lunch, 10 units at dinner  Non-insulin hypoglycemic drugs the patient is taking are:Invokamet 150/1000 twice a day  Current management, blood sugar patterns and problems identified:  His A1c is much better than usual at 6.9 compared to 7.7  He has lost 8 to 9 pounds  This is from cutting back on portions and high calorie foods  Also lately has been checking his sugars much more often after dinner compared to before  Although he has not documented any low blood sugars he may occasionally feel a little shaky; lowest blood sugar 71 after dinner  He has not adjusted his suppertime NovoLog coverage based on amount of carbohydrates or meal size  Fasting readings are much better with increasing [place] and better diet, previously averaging 140 in the morning  He is doing some walking on the treadmill, limited by hip pain  Renal function stable with Invokana        Side effects from medications have been:  None  Compliance with the medical regimen: Fair  Glucose monitoring:  done about 1 times a day         Glucometer:   One Touch Verio  Blood sugars by download   PRE-MEAL Fasting Lunch Dinner Bedtime Overall  Glucose range:  83-124  112     Mean/median:  103    110   POST-MEAL PC Breakfast PC Lunch PC Dinner  Glucose range:   174 71-150  Mean/median:    110     FASTING range 122-160, average 140 After dinner 148, 155   Self-care: The diet that the patient has been following is: tries to limit sweets and drinks with sugar .      Typical meal intake: Breakfast is egg, toast occasionally, sometimes skipped or; Lunch is a sandwich, has various snacks    Dinner 5-7 P.m.            Dietician visit, most recent: 06/2016 CDE visit: 11/17              Weight history:  Wt Readings from Last 3 Encounters:  07/10/20 209 lb (94.8 kg)  04/02/20 218 lb 8 oz (99.1 kg)  01/11/20 214 lb 6.4 oz (97.3 kg)    Glycemic control:   Lab Results  Component Value Date   HGBA1C 6.9 (H) 07/02/2020   HGBA1C 7.7 (H) 03/20/2020   HGBA1C 7.8 (H) 11/23/2019  Lab Results  Component Value Date   MICROALBUR 4.3 (H) 08/11/2016   LDLCALC 55 07/02/2020   CREATININE 0.78 07/02/2020   Lab Results  Component Value Date   MICRALBCREAT 88 (H) 07/02/2020       Allergies as of 07/10/2020   No Known Allergies     Medication List       Accurate as of July 10, 2020  9:47 AM. If you have any questions, ask your nurse or doctor.        STOP taking these medications   ALPRAZolam 0.5 MG tablet Commonly known as: Prudy Feeler Stopped by: Reather Littler, MD     TAKE these medications   aspirin 81 MG tablet Take 81 mg by mouth daily.   BD Pen Needle Nano 2nd Gen 32G X 4 MM Misc Generic drug: Insulin Pen Needle USE TWICE DAILY   Insulin Aspart FlexPen 100 UNIT/ML Sopn INJECY 8-10 UNITS UNDER THE SKIN BEFORE MAIN MEAL DAILY   Invokamet [phone] MG Tabs Generic drug: Canagliflozin-metFORMIN  HCl TAKE 2 TABLETS BY MOUTH DAILY   lisinopril 10 MG tablet Commonly known as: ZESTRIL TAKE 1 TABLET(10 MG) BY MOUTH DAILY   onetouch ultrasoft lancets Use to test blood sugar 2 times daily- Dx code E11.9   OneTouch Verio test strip Generic drug: glucose blood TEST TWICE DAILY DX CODE E11.9   simvastatin 40 MG tablet Commonly known as: ZOCOR TAKE 1 TABLET(40 MG) BY MOUTH DAILY AT 6 PM   Tresiba FlexTouch 200 UNIT/ML FlexTouch Pen Generic drug: insulin degludec ADMINISTER 40 UNITS UNDER THE SKIN DAILY   Vitamin D (Ergocalciferol) 1.25 MG (50000 UNIT) Caps capsule Commonly known as: DRISDOL Take 1 capsule (50,000 Units total) by mouth every 7 (seven) days.       Allergies: No Known Allergies  Past Medical History:  Diagnosis Date  . Diabetes mellitus without complication (HCC)   . Essential hypertension 10/16/2015  . Hyperlipidemia   . OA (osteoarthritis) of hip 11/05/2015  . Obese 10/16/2015  . Smoking   . Vitamin D deficiency 11/05/2015    No past surgical history on file.  Family History  Problem Relation Age of Onset  . Diabetes Mother   . Congestive Heart Failure Mother   . Cancer Sister        breast  . Cancer Sister        cervical  . Heart attack Brother     Social History:  reports that he has been smoking cigarettes. He has a 25.00 pack-year smoking history. He has never used smokeless tobacco. He reports current alcohol use of about 3.0 standard drinks of alcohol per week. He reports that he does not use drugs.   Review of Systems   Lipid history: Has good LDL levels with taking simvastatin 40 mg prescribed by his PCP HDL low but triglycerides are normal    Lab Results  Component Value Date   CHOL 108 07/02/2020   HDL 32 (L) 07/02/2020   LDLCALC 55 07/02/2020   TRIG 115 07/02/2020   CHOLHDL 3.4 07/02/2020           Hypertension:  is being controlled with lisinopril 10mg  Also on Invokana  Blood pressure being checked at home also:  Yesterday 117/64  BP Readings from Last 3 Encounters:  07/10/20 132/68  04/02/20 122/76  01/11/20 106/69   Potassium is upper normal again   Lab Results  Component Value Date   K 5.0 07/02/2020    Has eye exams regularly, was  told to have mild background retinopathy   Most recent foot exam: 8/21    LABS:  Abstract on 07/04/2020  Component Date Value Ref Range Status  . HM Diabetic Eye Exam 06/26/2020 Retinopathy* No Retinopathy Final    Physical Examination:  BP 132/68   Pulse (!) 102   Ht 5' 10.5" (1.791 m)   Wt 209 lb (94.8 kg)   SpO2 97%   BMI 29.56 kg/m   Standing blood pressure 126/68   ASSESSMENT:  Diabetes type 2, insulin-dependent  See history of present illness for detailed discussion of current diabetes management, blood sugar patterns and problems identified  His A1c is 6.9 now  He has lost weight, improved his diet and blood sugars are excellent They may be low normal after dinner but has not had any documented low sugars  Also taking some coverage for lunch meals when he is eating more carbohydrate  MICROALBUMINURIA: Gradually improving, now 88 compared to 98, will continue to monitor this  Hypertension: On lisinopril 10 mg, well-controlled and also monitoring at home Renal function is normal No symptoms of orthostatic lightheadedness  LIPIDS: LDL is excellent at 55  PLAN:    He will need to adjust his suppertime dose based on his portions and take only 7-8 units when eating low carbohydrate meals  Continue to cover lunch if eating more carbohydrates or larger meals  To check more readings after lunch and less in the morning  Discussed blood sugar targets both fasting and after meals  If his morning sugars stay below 90 he can cut back the Tresiba 2 to 4 units  Regular exercise  Follow-up in 3 months unless having any new problems  To check blood sugars when he feels shaky  Continue lisinopril unchanged  Patient  Instructions  Check blood sugars on waking up 3-4 days a week  Also check blood sugars about 2 hours after meals and do this after different meals by rotation  Recommended blood sugar levels on waking up are 90-130 and about 2 hours after meal is 130-160  Please bring your blood sugar monitor to each visit, thank you  Reduce supper dose to 7-8 Novolog for lo carb meals       Jose Munoz 07/10/2020, 9:47 AM   Note: This office note was prepared with Dragon voice recognition system technology. Any transcriptional errors that result from this process are unintentional.

## 2020-07-10 ENCOUNTER — Ambulatory Visit: Payer: PPO | Admitting: Endocrinology

## 2020-07-10 ENCOUNTER — Other Ambulatory Visit: Payer: Self-pay

## 2020-07-10 ENCOUNTER — Encounter: Payer: Self-pay | Admitting: Endocrinology

## 2020-07-10 VITALS — BP 132/68 | HR 102 | Ht 70.5 in | Wt 209.0 lb

## 2020-07-10 DIAGNOSIS — R809 Proteinuria, unspecified: Secondary | ICD-10-CM | POA: Diagnosis not present

## 2020-07-10 DIAGNOSIS — I10 Essential (primary) hypertension: Secondary | ICD-10-CM | POA: Diagnosis not present

## 2020-07-10 DIAGNOSIS — Z794 Long term (current) use of insulin: Secondary | ICD-10-CM

## 2020-07-10 DIAGNOSIS — E113299 Type 2 diabetes mellitus with mild nonproliferative diabetic retinopathy without macular edema, unspecified eye: Secondary | ICD-10-CM | POA: Diagnosis not present

## 2020-07-10 DIAGNOSIS — E119 Type 2 diabetes mellitus without complications: Secondary | ICD-10-CM

## 2020-07-10 DIAGNOSIS — E1129 Type 2 diabetes mellitus with other diabetic kidney complication: Secondary | ICD-10-CM

## 2020-07-10 NOTE — Patient Instructions (Signed)
Check blood sugars on waking up 3-4 days a week  Also check blood sugars about 2 hours after meals and do this after different meals by rotation  Recommended blood sugar levels on waking up are 90-130 and about 2 hours after meal is 130-160  Please bring your blood sugar monitor to each visit, thank you  Reduce supper dose to 7-8 Novolog for lo carb meals

## 2020-07-31 ENCOUNTER — Other Ambulatory Visit: Payer: Self-pay | Admitting: Endocrinology

## 2020-08-01 DIAGNOSIS — H2512 Age-related nuclear cataract, left eye: Secondary | ICD-10-CM | POA: Diagnosis not present

## 2020-08-02 ENCOUNTER — Other Ambulatory Visit: Payer: Self-pay | Admitting: Physician Assistant

## 2020-08-02 ENCOUNTER — Telehealth: Payer: Self-pay | Admitting: Physician Assistant

## 2020-08-02 DIAGNOSIS — E1169 Type 2 diabetes mellitus with other specified complication: Secondary | ICD-10-CM

## 2020-08-02 DIAGNOSIS — E781 Pure hyperglyceridemia: Secondary | ICD-10-CM

## 2020-08-02 DIAGNOSIS — E1159 Type 2 diabetes mellitus with other circulatory complications: Secondary | ICD-10-CM

## 2020-08-02 NOTE — Telephone Encounter (Signed)
Please contact patient to schedule apt for further medication refills per last AVS. AS, CMA

## 2020-08-05 ENCOUNTER — Other Ambulatory Visit: Payer: Self-pay | Admitting: Physician Assistant

## 2020-08-05 DIAGNOSIS — E559 Vitamin D deficiency, unspecified: Secondary | ICD-10-CM

## 2020-08-05 NOTE — Telephone Encounter (Signed)
Unable to refill Vit D until patient has lab checked. Lab has not been obtained since 1/21 and was on the high end of normal. Pt will need labs for further refill to ensure he is not taking too much vit d. AS, CMA

## 2020-08-05 NOTE — Telephone Encounter (Signed)
Patient scheduled per last AVS towards the end of April due to his having some surgeries in March and he wanted time to recover.

## 2020-08-06 ENCOUNTER — Other Ambulatory Visit: Payer: Self-pay

## 2020-08-06 ENCOUNTER — Other Ambulatory Visit: Payer: Self-pay | Admitting: Endocrinology

## 2020-08-06 ENCOUNTER — Encounter: Payer: Self-pay | Admitting: Ophthalmology

## 2020-08-08 ENCOUNTER — Other Ambulatory Visit: Payer: Self-pay

## 2020-08-08 ENCOUNTER — Other Ambulatory Visit
Admission: RE | Admit: 2020-08-08 | Discharge: 2020-08-08 | Disposition: A | Discharge status: Home / Self Care | Payer: PPO | Source: Ambulatory Visit | Attending: Ophthalmology | Admitting: Ophthalmology

## 2020-08-08 DIAGNOSIS — Z20822 Contact with and (suspected) exposure to covid-19: Secondary | ICD-10-CM | POA: Insufficient documentation

## 2020-08-08 DIAGNOSIS — Z01812 Encounter for preprocedural laboratory examination: Secondary | ICD-10-CM | POA: Insufficient documentation

## 2020-08-08 NOTE — Discharge Instructions (Signed)

## 2020-08-09 LAB — SARS CORONAVIRUS 2 (TAT 6-24 HRS): SARS Coronavirus 2: NEGATIVE

## 2020-08-12 ENCOUNTER — Ambulatory Visit: Payer: PPO | Admitting: Anesthesiology

## 2020-08-12 ENCOUNTER — Encounter: Payer: Self-pay | Admitting: Ophthalmology

## 2020-08-12 ENCOUNTER — Other Ambulatory Visit: Payer: Self-pay

## 2020-08-12 ENCOUNTER — Ambulatory Visit
Admission: RE | Admit: 2020-08-12 | Discharge: 2020-08-12 | Disposition: A | Discharge status: Home / Self Care | Payer: PPO | Attending: Ophthalmology | Admitting: Ophthalmology

## 2020-08-12 ENCOUNTER — Encounter
Admission: RE | Disposition: A | Discharge status: Home / Self Care | Payer: Self-pay | Source: Home / Self Care | Attending: Ophthalmology

## 2020-08-12 DIAGNOSIS — Z794 Long term (current) use of insulin: Secondary | ICD-10-CM | POA: Insufficient documentation

## 2020-08-12 DIAGNOSIS — E1136 Type 2 diabetes mellitus with diabetic cataract: Secondary | ICD-10-CM | POA: Diagnosis not present

## 2020-08-12 DIAGNOSIS — H2512 Age-related nuclear cataract, left eye: Secondary | ICD-10-CM | POA: Diagnosis not present

## 2020-08-12 DIAGNOSIS — Z7982 Long term (current) use of aspirin: Secondary | ICD-10-CM | POA: Insufficient documentation

## 2020-08-12 DIAGNOSIS — H25812 Combined forms of age-related cataract, left eye: Secondary | ICD-10-CM | POA: Diagnosis not present

## 2020-08-12 DIAGNOSIS — Z833 Family history of diabetes mellitus: Secondary | ICD-10-CM | POA: Insufficient documentation

## 2020-08-12 DIAGNOSIS — Z8249 Family history of ischemic heart disease and other diseases of the circulatory system: Secondary | ICD-10-CM | POA: Diagnosis not present

## 2020-08-12 DIAGNOSIS — Z79899 Other long term (current) drug therapy: Secondary | ICD-10-CM | POA: Insufficient documentation

## 2020-08-12 DIAGNOSIS — I10 Essential (primary) hypertension: Secondary | ICD-10-CM | POA: Diagnosis not present

## 2020-08-12 DIAGNOSIS — Z8049 Family history of malignant neoplasm of other genital organs: Secondary | ICD-10-CM | POA: Insufficient documentation

## 2020-08-12 DIAGNOSIS — F17210 Nicotine dependence, cigarettes, uncomplicated: Secondary | ICD-10-CM | POA: Insufficient documentation

## 2020-08-12 DIAGNOSIS — Z803 Family history of malignant neoplasm of breast: Secondary | ICD-10-CM | POA: Insufficient documentation

## 2020-08-12 HISTORY — PX: CATARACT EXTRACTION W/PHACO: SHX586

## 2020-08-12 HISTORY — DX: Other specified postprocedural states: Z98.890

## 2020-08-12 LAB — GLUCOSE, CAPILLARY
Glucose-Capillary: 150 mg/dL — ABNORMAL HIGH (ref 70–99)
Glucose-Capillary: 157 mg/dL — ABNORMAL HIGH (ref 70–99)

## 2020-08-12 SURGERY — PHACOEMULSIFICATION, CATARACT, WITH IOL INSERTION
Anesthesia: Monitor Anesthesia Care | Site: Eye | Laterality: Left

## 2020-08-12 MED ORDER — MIDAZOLAM HCL 2 MG/2ML IJ SOLN
INTRAMUSCULAR | Status: DC | PRN
  Administered 2020-08-12: 1 mg via INTRAVENOUS

## 2020-08-12 MED ORDER — LACTATED RINGERS IV SOLN: INTRAVENOUS | Status: DC

## 2020-08-12 MED ORDER — SODIUM HYALURONATE 10 MG/ML IO SOLN
INTRAOCULAR | Status: DC | PRN
  Administered 2020-08-12: 0.55 mL via INTRAOCULAR

## 2020-08-12 MED ORDER — MOXIFLOXACIN HCL 0.5 % OP SOLN
OPHTHALMIC | Status: DC | PRN
  Administered 2020-08-12: 0.2 mL via OPHTHALMIC

## 2020-08-12 MED ORDER — ACETAMINOPHEN 325 MG PO TABS: 325.0000 mg | ORAL_TABLET | ORAL | Status: DC | PRN

## 2020-08-12 MED ORDER — LIDOCAINE HCL (PF) 2 % IJ SOLN
INTRAOCULAR | Status: DC | PRN
  Administered 2020-08-12: 4 mL via INTRAOCULAR

## 2020-08-12 MED ORDER — ARMC OPHTHALMIC DILATING DROPS
1.0000 "application " | OPHTHALMIC | Status: DC | PRN
  Administered 2020-08-12 (×3): 1 via OPHTHALMIC

## 2020-08-12 MED ORDER — ONDANSETRON HCL 4 MG/2ML IJ SOLN: 4.0000 mg | Freq: Once | INTRAMUSCULAR | Status: DC | PRN

## 2020-08-12 MED ORDER — SODIUM HYALURONATE 23 MG/ML IO SOLN
INTRAOCULAR | Status: DC | PRN
  Administered 2020-08-12: 0.6 mL via INTRAOCULAR

## 2020-08-12 MED ORDER — FENTANYL CITRATE (PF) 100 MCG/2ML IJ SOLN
INTRAMUSCULAR | Status: DC | PRN
  Administered 2020-08-12: 50 ug via INTRAVENOUS

## 2020-08-12 MED ORDER — TETRACAINE HCL 0.5 % OP SOLN
1.0000 [drp] | OPHTHALMIC | Status: DC | PRN
  Administered 2020-08-12 (×3): 1 [drp] via OPHTHALMIC

## 2020-08-12 MED ORDER — ACETAMINOPHEN 160 MG/5ML PO SOLN
325.0000 mg | ORAL | Status: DC | PRN
Start: 2020-08-12 — End: 2020-08-12

## 2020-08-12 MED ORDER — EPINEPHRINE PF 1 MG/ML IJ SOLN
INTRAOCULAR | Status: DC | PRN
  Administered 2020-08-12: 69 mL via OPHTHALMIC

## 2020-08-12 SURGICAL SUPPLY — 16 items
CANNULA ANT/CHMB 27GA (MISCELLANEOUS) ×4 IMPLANT
DISSECTOR HYDRO NUCLEUS 50X22 (MISCELLANEOUS) ×2 IMPLANT
GLOVE SURG SYN 8.5  E (GLOVE) ×3
GLOVE SURG SYN 8.5 E (GLOVE) ×3 IMPLANT
GOWN STRL REUS W/ TWL LRG LVL3 (GOWN DISPOSABLE) ×2 IMPLANT
GOWN STRL REUS W/TWL LRG LVL3 (GOWN DISPOSABLE) ×4
LENS IOL ACRSF IQ PAN 14.5 ×1 IMPLANT
LENS IOL IQ PANOPTIX 14.5 ×2 IMPLANT
MARKER SKIN DUAL TIP RULER LAB (MISCELLANEOUS) ×2 IMPLANT
PACK DR. KING ARMS (PACKS) ×2 IMPLANT
PACK EYE AFTER SURG (MISCELLANEOUS) ×2 IMPLANT
PACK OPTHALMIC (MISCELLANEOUS) ×2 IMPLANT
SYR 3ML LL SCALE MARK (SYRINGE) ×2 IMPLANT
SYR TB 1ML LUER SLIP (SYRINGE) ×2 IMPLANT
WATER STERILE IRR 250ML POUR (IV SOLUTION) ×2 IMPLANT
WIPE NON LINTING 3.25X3.25 (MISCELLANEOUS) ×2 IMPLANT

## 2020-08-12 NOTE — H&P (Signed)
[facility]   Primary Care Physician:  Mayer Masker, PA-C Ophthalmologist: Dr. Willey Blade  Pre-Procedure History & Physical: HPI:  Jose Munoz is a 70 y.o. male here for cataract surgery.   Past Medical History:  Diagnosis Date  . Diabetes mellitus without complication (HCC)   . Essential hypertension 10/16/2015  . History of oral surgery   . Hyperlipidemia   . OA (osteoarthritis) of hip 11/05/2015  . Obese 10/16/2015  . Smoking   . Vitamin D deficiency 11/05/2015    History reviewed. No pertinent surgical history.  Prior to Admission medications   Medication Sig Start Date End Date Taking? Authorizing Provider  aspirin 81 MG tablet Take 81 mg by mouth daily.   Yes [provider]  Insulin Aspart FlexPen 100 UNIT/ML SOPN INJECT 8-10 UNITS SUBCUTANEOUSLY BEFORE MAIN MEAL DAILY 08/06/20  Yes Reather Littler, MD  INVOKAMET [phone] MG TABS TAKE 2 TABLETS BY MOUTH DAILY 06/05/20  Yes Reather Littler, MD  lisinopril (ZESTRIL) 10 MG tablet Take 1 tablet (10 mg total) by mouth daily. **NEEDS APT FOR REFILLS** 08/02/20  Yes Abonza, Maritza, PA-C  simvastatin (ZOCOR) 40 MG tablet Take 1 tablet (40 mg total) by mouth daily at 6 PM. **NEEDS APT FOR REFILLS** 08/02/20  Yes Abonza, Maritza, PA-C  TRESIBA FLEXTOUCH 200 UNIT/ML FlexTouch Pen ADMINISTER 40 UNITS UNDER THE SKIN DAILY 07/31/20  Yes Reather Littler, MD  Vitamin D, Ergocalciferol, (DRISDOL) 1.25 MG (50000 UT) CAPS capsule Take 1 capsule (50,000 Units total) by mouth every 7 (seven) days. 05/03/19  Yes Opalski, Deborah, DO  BD PEN NEEDLE NANO 2ND GEN 32G X 4 MM MISC USE TWICE DAILY 01/29/20   Reather Littler, MD  glucose blood [facility] VERIO) test strip TEST TWICE DAILY DX CODE E11.9 07/01/20   Reather Littler, MD  Lancets [facility] ULTRASOFT) lancets Use to test blood sugar 2 times daily- Dx code E11.9 07/02/16   Reather Littler, MD    Allergies as of 07/02/2020  . (No Known Allergies)    Family History  Problem Relation Age of Onset  .  Diabetes Mother   . Congestive Heart Failure Mother   . Cancer Sister        breast  . Cancer Sister        cervical  . Heart attack Brother     Social History   Socioeconomic History  . Marital status: Widowed    Spouse name: Not on file  . Number of children: Not on file  . Years of education: Not on file  . Highest education level: Not on file  Occupational History  . Not on file  Tobacco Use  . Smoking status: Current Every Day Smoker    Packs/day: 1.00    Years: 25.00    Pack years: 25.00    Types: Cigarettes  . Smokeless tobacco: Never Used  Vaping Use  . Vaping Use: Never used  Substance and Sexual Activity  . Alcohol use: Not Currently    Alcohol/week: 0.0 standard drinks  . Drug use: No  . Sexual activity: Never  Other Topics Concern  . Not on file  Social History Narrative  . Not on file   Social Determinants of Health   Financial Resource Strain: Not on file  Food Insecurity: Not on file  Transportation Needs: Not on file  Physical Activity: Not on file  Stress: Not on file  Social Connections: Not on file  Intimate Partner Violence: Not on file    Review of Systems:  See HPI, otherwise negative ROS  Physical Exam: BP 130/80   Pulse 87   Temp 97.6 F (36.4 C) (Temporal)   Ht 5' 10.5" (1.791 m)   Wt 95.3 kg   SpO2 99%   BMI 29.71 kg/m  General:   Alert,  pleasant and cooperative in NAD Head:  Normocephalic and atraumatic. Respiratory:  Normal work of breathing. Cardiovascular:  RRR  Impression/Plan: Jose Munoz is here for cataract surgery.  Risks, benefits, limitations, and alternatives regarding cataract surgery have been reviewed with the patient.  Questions have been answered.  All parties agreeable.   Willey Blade, MD  08/12/2020, 9:51 AM

## 2020-08-12 NOTE — Op Note (Signed)
OPERATIVE NOTE  Jose Munoz [id number] 08/12/2020   PREOPERATIVE DIAGNOSIS:  Nuclear sclerotic cataract left eye.  H25.12   POSTOPERATIVE DIAGNOSIS:    Nuclear sclerotic cataract left eye.     PROCEDURE:  Phacoemusification with posterior chamber intraocular lens placement of the left eye   LENS:   Implant Name Type Inv. Item Serial No. Manufacturer Lot No. LRB No. Used Action  LENS IOL IQ PANOPTIX 14.5 - [id number]  LENS IOL IQ PANOPTIX 14.5 [id number] ALCON  Left 1 Implanted      Procedure(s) with comments: CATARACT EXTRACTION PHACO AND INTRAOCULAR LENS PLACEMENT (IOC) LEFT DIABETIC PANOPTIX  TORIC LENS (Left) - 3.75 0:30.1  TFNT00 +14.5   ULTRASOUND TIME: 0 minutes 30 seconds.  CDE 3.75   SURGEON:  Willey Blade, MD, MPH   ANESTHESIA:  Topical with tetracaine drops augmented with 1% preservative-free intracameral lidocaine.  ESTIMATED BLOOD LOSS: <1 mL   COMPLICATIONS:  None.   DESCRIPTION OF PROCEDURE:  The patient was identified in the holding room and transported to the operating room and placed in the supine position under the operating microscope.  The left eye was identified as the operative eye and it was prepped and draped in the usual sterile ophthalmic fashion.   A 1.0 millimeter clear-corneal paracentesis was made at the 5:00 position. 0.5 ml of preservative-free 1% lidocaine with epinephrine was injected into the anterior chamber.  The anterior chamber was filled with Healon 5 viscoelastic.  A 2.4 millimeter keratome was used to make a near-clear corneal incision at the 2:00 position.  A curvilinear capsulorrhexis was made with a cystotome and capsulorrhexis forceps.  Balanced salt solution was used to hydrodissect and hydrodelineate the nucleus.   Phacoemulsification was then used in stop and chop fashion to remove the lens nucleus and epinucleus.  The remaining cortex was then removed using the irrigation and aspiration handpiece. Healon was then placed into  the capsular bag to distend it for lens placement.  A lens was then injected into the capsular bag.  The remaining viscoelastic was aspirated.   Wounds were hydrated with balanced salt solution.  The anterior chamber was inflated to a physiologic pressure with balanced salt solution.  The lens was well centered.  Intracameral vigamox 0.1 mL undiltued was injected into the eye and a drop placed onto the ocular surface.  No wound leaks were noted.  The patient was taken to the recovery room in stable condition without complications of anesthesia or surgery  Willey Blade 08/12/2020, 10:19 AM

## 2020-08-12 NOTE — Anesthesia Procedure Notes (Signed)
Procedure Name: MAC Date/Time: 08/12/2020 10:07 AM Performed by: Cameron Ali, CRNA Pre-anesthesia Checklist: Patient identified, Emergency Drugs available, Suction available, Timeout performed and Patient being monitored Patient Re-evaluated:Patient Re-evaluated prior to induction Oxygen Delivery Method: Nasal cannula Placement Confirmation: positive ETCO2

## 2020-08-12 NOTE — Anesthesia Preprocedure Evaluation (Signed)
Anesthesia Evaluation  Patient identified by MRN, date of birth, ID band Patient awake    Reviewed: Allergy & Precautions, NPO status , Patient's Chart, lab work & pertinent test results  Airway Mallampati: II  TM Distance: >3 FB Neck ROM: Full    Dental no notable dental hx.    Pulmonary Current Smoker and Patient abstained from smoking.,    Pulmonary exam normal breath sounds clear to auscultation       Cardiovascular Exercise Tolerance: Good hypertension, Normal cardiovascular exam Rhythm:Regular Rate:Normal     Neuro/Psych PSYCHIATRIC DISORDERS Anxiety negative neurological ROS     GI/Hepatic negative GI ROS, Neg liver ROS,   Endo/Other  negative endocrine ROSdiabetes, Type 2  Renal/GU Renal disease  negative genitourinary   Musculoskeletal  (+) Arthritis ,   Abdominal Normal abdominal exam  (+) - obese, scaphoid  Abdomen: soft.    Peds negative pediatric ROS (+)  Hematology negative hematology ROS (+)   Anesthesia Other Findings   Reproductive/Obstetrics negative OB ROS                             Anesthesia Physical Anesthesia Plan  ASA: III  Anesthesia Plan: General and MAC   Post-op Pain Management:    Induction: Intravenous  PONV Risk Score and Plan: 1 and Treatment may vary due to age or medical condition  Airway Management Planned: Natural Airway and Nasal Cannula  Additional Equipment:   Intra-op Plan:   Post-operative Plan: Extubation in OR  Informed Consent: I have reviewed the patients History and Physical, chart, labs and discussed the procedure including the risks, benefits and alternatives for the proposed anesthesia with the patient or authorized representative who has indicated his/her understanding and acceptance.     Dental advisory given  Plan Discussed with: CRNA  Anesthesia Plan Comments:         Anesthesia Quick Evaluation  Patient  Active Problem List   Diagnosis Date Noted  . Background diabetic retinopathy associated with type 2 diabetes mellitus ([place]) 07/10/2020  . Stomach cramps, generalized 05/08/2019  . Microalbuminuria due to type 2 diabetes mellitus ([place]) 05/03/2019  . Hypertriglyceridemia 05/03/2019  . Overweight (BMI 25.0-29.9) 05/03/2019  . Anxious mood 01/17/2019  . Insomnia 01/17/2019  . Hearing loss of left ear due to cerumen impaction 09/14/2018  . Mixed diabetic hyperlipidemia associated with type 2 diabetes mellitus ([place]) 11/12/2017  . Hypertension associated with diabetes ([place]) 11/12/2017  . OA (osteoarthritis) of hip 11/05/2015  . Vitamin D deficiency 11/05/2015  . smoking cessation counseling 11/05/2015  . Diabetes mellitus without complication ([place]) 25/85/2778  . Smoking 10/16/2015    CBC Latest Ref Rng & Units 06/13/2019 03/03/2018 10/18/2015  WBC 3.4 - 10.8 x10E3/uL 10.2 8.1 6.5  Hemoglobin 13.0 - 17.7 g/dL 17.3 17.3 15.8  Hematocrit 37.5 - 51.0 % 51.2(H) 50.8 47.4  Platelets 150 - 450 x10E3/uL 330 300 276   BMP Latest Ref Rng & Units 07/02/2020 03/20/2020 11/23/2019  Glucose 65 - 99 mg/dL 97 132(H) 128(H)  BUN 8 - 27 mg/dL _0 Creatinine 0.76 - 1.27 mg/dL 0.78 0.76 0.83  BUN/Creat Ratio 10 - _1 Sodium 134 - 144 mmol/L 139 138 135  Potassium 3.5 - 5.2 mmol/L 5.0 4.9 5.0  Chloride 96 - 106 mmol/L 101 99 98  CO2 20 - 29 mmol/L _2 Calcium 8.6 - 10.2 mg/dL 9.7 9.4 9.5    Risks  and benefits of anesthesia discussed at length, patient or surrogate demonstrates understanding. Appropriately NPO. Plan to proceed with anesthesia.  Champ Mungo, MD 08/12/20

## 2020-08-12 NOTE — Anesthesia Postprocedure Evaluation (Signed)
Anesthesia Post Note  Patient: Jose Munoz  Procedure(s) Performed: CATARACT EXTRACTION PHACO AND INTRAOCULAR LENS PLACEMENT (IOC) LEFT DIABETIC PANOPTIX  TORIC LENS (Left Eye)     Patient location during evaluation: PACU Anesthesia Type: MAC Level of consciousness: awake and alert Pain management: pain level controlled Vital Signs Assessment: post-procedure vital signs reviewed and stable Respiratory status: spontaneous breathing, nonlabored ventilation, respiratory function stable and patient connected to nasal cannula oxygen Cardiovascular status: stable and blood pressure returned to baseline Postop Assessment: no apparent nausea or vomiting Anesthetic complications: no   No complications documented.  Sinda Du

## 2020-08-12 NOTE — Transfer of Care (Signed)
Immediate Anesthesia Transfer of Care Note  Patient: Jose Munoz  Procedure(s) Performed: CATARACT EXTRACTION PHACO AND INTRAOCULAR LENS PLACEMENT (IOC) LEFT DIABETIC PANOPTIX  TORIC LENS (Left Eye)  Patient Location: PACU  Anesthesia Type: General, MAC  Level of Consciousness: awake, alert  and patient cooperative  Airway and Oxygen Therapy: Patient Spontanous Breathing   Post-op Assessment: Post-op Vital signs reviewed, Patient's Cardiovascular Status Stable, Respiratory Function Stable, Patent Airway and No signs of Nausea or vomiting  Post-op Vital Signs: Reviewed and stable  Complications: No complications documented.

## 2020-08-13 ENCOUNTER — Encounter: Payer: Self-pay | Admitting: Ophthalmology

## 2020-08-15 ENCOUNTER — Other Ambulatory Visit: Payer: Self-pay

## 2020-08-15 ENCOUNTER — Encounter: Payer: Self-pay | Admitting: Ophthalmology

## 2020-08-21 DIAGNOSIS — H2511 Age-related nuclear cataract, right eye: Secondary | ICD-10-CM | POA: Diagnosis not present

## 2020-08-22 ENCOUNTER — Other Ambulatory Visit
Admission: RE | Admit: 2020-08-22 | Discharge: 2020-08-22 | Disposition: A | Discharge status: Home / Self Care | Payer: PPO | Source: Ambulatory Visit | Attending: Ophthalmology | Admitting: Ophthalmology

## 2020-08-22 ENCOUNTER — Other Ambulatory Visit: Payer: Self-pay

## 2020-08-22 DIAGNOSIS — Z01812 Encounter for preprocedural laboratory examination: Secondary | ICD-10-CM | POA: Diagnosis not present

## 2020-08-22 DIAGNOSIS — Z20822 Contact with and (suspected) exposure to covid-19: Secondary | ICD-10-CM | POA: Insufficient documentation

## 2020-08-22 NOTE — Discharge Instructions (Signed)

## 2020-08-23 LAB — SARS CORONAVIRUS 2 (TAT 6-24 HRS): SARS Coronavirus 2: NEGATIVE

## 2020-08-26 ENCOUNTER — Ambulatory Visit: Payer: PPO | Admitting: Anesthesiology

## 2020-08-26 ENCOUNTER — Encounter
Admission: RE | Disposition: A | Discharge status: Home / Self Care | Payer: Self-pay | Source: Home / Self Care | Attending: Ophthalmology

## 2020-08-26 ENCOUNTER — Ambulatory Visit
Admission: RE | Admit: 2020-08-26 | Discharge: 2020-08-26 | Disposition: A | Discharge status: Home / Self Care | Payer: PPO | Attending: Ophthalmology | Admitting: Ophthalmology

## 2020-08-26 ENCOUNTER — Other Ambulatory Visit: Payer: Self-pay

## 2020-08-26 ENCOUNTER — Encounter: Payer: Self-pay | Admitting: Ophthalmology

## 2020-08-26 DIAGNOSIS — E1136 Type 2 diabetes mellitus with diabetic cataract: Secondary | ICD-10-CM | POA: Insufficient documentation

## 2020-08-26 DIAGNOSIS — Z794 Long term (current) use of insulin: Secondary | ICD-10-CM | POA: Insufficient documentation

## 2020-08-26 DIAGNOSIS — Z7982 Long term (current) use of aspirin: Secondary | ICD-10-CM | POA: Diagnosis not present

## 2020-08-26 DIAGNOSIS — F17210 Nicotine dependence, cigarettes, uncomplicated: Secondary | ICD-10-CM | POA: Diagnosis not present

## 2020-08-26 DIAGNOSIS — H2511 Age-related nuclear cataract, right eye: Secondary | ICD-10-CM | POA: Insufficient documentation

## 2020-08-26 DIAGNOSIS — H25811 Combined forms of age-related cataract, right eye: Secondary | ICD-10-CM | POA: Diagnosis not present

## 2020-08-26 HISTORY — PX: CATARACT EXTRACTION W/PHACO: SHX586

## 2020-08-26 LAB — GLUCOSE, CAPILLARY
Glucose-Capillary: 129 mg/dL — ABNORMAL HIGH (ref 70–99)
Glucose-Capillary: 136 mg/dL — ABNORMAL HIGH (ref 70–99)

## 2020-08-26 SURGERY — PHACOEMULSIFICATION, CATARACT, WITH IOL INSERTION
Anesthesia: Monitor Anesthesia Care | Site: Eye | Laterality: Right

## 2020-08-26 MED ORDER — ACETAMINOPHEN 160 MG/5ML PO SOLN: 325.0000 mg | Freq: Once | ORAL | Status: DC

## 2020-08-26 MED ORDER — TETRACAINE HCL 0.5 % OP SOLN
1.0000 [drp] | OPHTHALMIC | Status: DC | PRN
  Administered 2020-08-26 (×3): 1 [drp] via OPHTHALMIC

## 2020-08-26 MED ORDER — LACTATED RINGERS IV SOLN: INTRAVENOUS | Status: DC

## 2020-08-26 MED ORDER — SODIUM HYALURONATE 23 MG/ML IO SOLN
INTRAOCULAR | Status: DC | PRN
  Administered 2020-08-26: 0.6 mL via INTRAOCULAR

## 2020-08-26 MED ORDER — EPINEPHRINE PF 1 MG/ML IJ SOLN
INTRAOCULAR | Status: DC | PRN
  Administered 2020-08-26: 75 mL via OPHTHALMIC

## 2020-08-26 MED ORDER — MIDAZOLAM HCL 2 MG/2ML IJ SOLN
INTRAMUSCULAR | Status: DC | PRN
  Administered 2020-08-26: 1 mg via INTRAVENOUS

## 2020-08-26 MED ORDER — MOXIFLOXACIN HCL 0.5 % OP SOLN
OPHTHALMIC | Status: DC | PRN
  Administered 2020-08-26: 0.2 mL via OPHTHALMIC

## 2020-08-26 MED ORDER — ACETAMINOPHEN 325 MG PO TABS: 325.0000 mg | ORAL_TABLET | Freq: Once | ORAL | Status: DC

## 2020-08-26 MED ORDER — LIDOCAINE HCL (PF) 2 % IJ SOLN
INTRAOCULAR | Status: DC | PRN
  Administered 2020-08-26: 1 mL via INTRAOCULAR

## 2020-08-26 MED ORDER — SODIUM HYALURONATE 10 MG/ML IO SOLN
INTRAOCULAR | Status: DC | PRN
  Administered 2020-08-26: 0.55 mL via INTRAOCULAR

## 2020-08-26 MED ORDER — FENTANYL CITRATE (PF) 100 MCG/2ML IJ SOLN
INTRAMUSCULAR | Status: DC | PRN
  Administered 2020-08-26: 50 ug via INTRAVENOUS

## 2020-08-26 MED ORDER — ARMC OPHTHALMIC DILATING DROPS
1.0000 "application " | OPHTHALMIC | Status: DC | PRN
  Administered 2020-08-26 (×3): 1 via OPHTHALMIC

## 2020-08-26 SURGICAL SUPPLY — 18 items
CANNULA ANT/CHMB 27GA (MISCELLANEOUS) ×4 IMPLANT
DISSECTOR HYDRO NUCLEUS 50X22 (MISCELLANEOUS) ×2 IMPLANT
GLOVE PI ULTRA LF STRL 7.5 (GLOVE) ×1 IMPLANT
GLOVE PI ULTRA NON LATEX 7.5 (GLOVE) ×1
GLOVE SURG SYN 8.5  E (GLOVE) ×1
GLOVE SURG SYN 8.5 E (GLOVE) ×1 IMPLANT
GOWN STRL REUS W/ TWL LRG LVL3 (GOWN DISPOSABLE) ×2 IMPLANT
GOWN STRL REUS W/TWL LRG LVL3 (GOWN DISPOSABLE) ×4
LENS IOL ACRSF IQ PAN 12.5 ×1 IMPLANT
LENS IOL TORIC TRIFOCAL 12.5 ×2 IMPLANT
MARKER SKIN DUAL TIP RULER LAB (MISCELLANEOUS) ×2 IMPLANT
PACK DR. KING ARMS (PACKS) ×2 IMPLANT
PACK EYE AFTER SURG (MISCELLANEOUS) ×2 IMPLANT
PACK OPTHALMIC (MISCELLANEOUS) ×2 IMPLANT
SYR 3ML LL SCALE MARK (SYRINGE) ×2 IMPLANT
SYR TB 1ML LUER SLIP (SYRINGE) ×2 IMPLANT
WATER STERILE IRR 250ML POUR (IV SOLUTION) ×2 IMPLANT
WIPE NON LINTING 3.25X3.25 (MISCELLANEOUS) ×2 IMPLANT

## 2020-08-26 NOTE — Anesthesia Postprocedure Evaluation (Signed)
Anesthesia Post Note  Patient: Jose Munoz  Procedure(s) Performed: CATARACT EXTRACTION PHACO AND INTRAOCULAR LENS PLACEMENT (IOC) RIGHT DIABETIC PANOPTIX  LENS 2.82 00:26.1 (Right Eye)     Patient location during evaluation: PACU Anesthesia Type: MAC Level of consciousness: awake and alert and oriented Pain management: satisfactory to patient Vital Signs Assessment: post-procedure vital signs reviewed and stable Respiratory status: spontaneous breathing, nonlabored ventilation and respiratory function stable Cardiovascular status: blood pressure returned to baseline and stable Postop Assessment: Adequate PO intake and No signs of nausea or vomiting Anesthetic complications: no   No complications documented.  Raliegh Ip

## 2020-08-26 NOTE — H&P (Signed)
[facility]   Primary Care Physician:  Mayer Masker, PA-C Ophthalmologist: Dr. Willey Blade  Pre-Procedure History & Physical: HPI:  Jose Munoz is a 70 y.o. male here for cataract surgery.   Past Medical History:  Diagnosis Date  . Diabetes mellitus without complication (HCC)   . Essential hypertension 10/16/2015  . History of oral surgery   . Hyperlipidemia   . OA (osteoarthritis) of hip 11/05/2015  . Obese 10/16/2015  . Smoking   . Vitamin D deficiency 11/05/2015    Past Surgical History:  Procedure Laterality Date  . CATARACT EXTRACTION W/PHACO Left 08/12/2020   Procedure: CATARACT EXTRACTION PHACO AND INTRAOCULAR LENS PLACEMENT (IOC) LEFT DIABETIC PANOPTIX  TORIC LENS;  Surgeon: Nevada Crane, MD;  Location: [facility];  Service: Ophthalmology;  Laterality: Left;  3.75 0:30.1    Prior to Admission medications   Medication Sig Start Date End Date Taking? Authorizing Provider  aspirin 81 MG tablet Take 81 mg by mouth daily.   Yes [provider]  Insulin Aspart FlexPen 100 UNIT/ML SOPN INJECT 8-10 UNITS SUBCUTANEOUSLY BEFORE MAIN MEAL DAILY 08/06/20  Yes Reather Littler, MD  INVOKAMET [phone] MG TABS TAKE 2 TABLETS BY MOUTH DAILY 06/05/20  Yes Reather Littler, MD  lisinopril (ZESTRIL) 10 MG tablet Take 1 tablet (10 mg total) by mouth daily. **NEEDS APT FOR REFILLS** 08/02/20  Yes Abonza, Maritza, PA-C  simvastatin (ZOCOR) 40 MG tablet Take 1 tablet (40 mg total) by mouth daily at 6 PM. **NEEDS APT FOR REFILLS** 08/02/20  Yes Abonza, Maritza, PA-C  TRESIBA FLEXTOUCH 200 UNIT/ML FlexTouch Pen ADMINISTER 40 UNITS UNDER THE SKIN DAILY 07/31/20  Yes Reather Littler, MD  Vitamin D, Ergocalciferol, (DRISDOL) 1.25 MG (50000 UT) CAPS capsule Take 1 capsule (50,000 Units total) by mouth every 7 (seven) days. 05/03/19  Yes Opalski, Deborah, DO  BD PEN NEEDLE NANO 2ND GEN 32G X 4 MM MISC USE TWICE DAILY 01/29/20   Reather Littler, MD  glucose blood [facility] VERIO) test strip  TEST TWICE DAILY DX CODE E11.9 07/01/20   Reather Littler, MD  Lancets [facility] ULTRASOFT) lancets Use to test blood sugar 2 times daily- Dx code E11.9 07/02/16   Reather Littler, MD    Allergies as of 07/02/2020  . (No Known Allergies)    Family History  Problem Relation Age of Onset  . Diabetes Mother   . Congestive Heart Failure Mother   . Cancer Sister        breast  . Cancer Sister        cervical  . Heart attack Brother     Social History   Socioeconomic History  . Marital status: Widowed    Spouse name: Not on file  . Number of children: Not on file  . Years of education: Not on file  . Highest education level: Not on file  Occupational History  . Not on file  Tobacco Use  . Smoking status: Current Every Day Smoker    Packs/day: 1.00    Years: 25.00    Pack years: 25.00    Types: Cigarettes  . Smokeless tobacco: Never Used  Vaping Use  . Vaping Use: Never used  Substance and Sexual Activity  . Alcohol use: Not Currently    Alcohol/week: 0.0 standard drinks  . Drug use: No  . Sexual activity: Never  Other Topics Concern  . Not on file  Social History Narrative  . Not on file   Social Determinants of Health   Financial  Resource Strain: Not on file  Food Insecurity: Not on file  Transportation Needs: Not on file  Physical Activity: Not on file  Stress: Not on file  Social Connections: Not on file  Intimate Partner Violence: Not on file    Review of Systems: See HPI, otherwise negative ROS  Physical Exam: BP 123/74   Pulse 87   Temp (!) 97.1 F (36.2 C) (Temporal)   Resp 16   Ht 5' 10.5" (1.791 m)   Wt 95.3 kg   SpO2 95%   BMI 29.71 kg/m  General:   Alert,  pleasant and cooperative in NAD Head:  Normocephalic and atraumatic. Respiratory:  Normal work of breathing. Cardiovascular:  RRR  Impression/Plan: Jose Munoz is here for cataract surgery.  Risks, benefits, limitations, and alternatives regarding cataract surgery have been reviewed  with the patient.  Questions have been answered.  All parties agreeable.   Willey Blade, MD  08/26/2020, 7:15 AM

## 2020-08-26 NOTE — Op Note (Signed)
OPERATIVE NOTE  Jose Munoz [id number] 08/26/2020   PREOPERATIVE DIAGNOSIS:  Nuclear sclerotic cataract right eye.  H25.11   POSTOPERATIVE DIAGNOSIS:    Nuclear sclerotic cataract right eye.     PROCEDURE:  Phacoemusification with posterior chamber intraocular lens placement of the right eye   LENS:   Implant Name Type Inv. Item Serial No. Manufacturer Lot No. LRB No. Used Action  LENS IOL TORIC TRIFOCAL 12.5 - [id number]  LENS IOL TORIC TRIFOCAL 12.5 [id number] ALCON  Right 1 Implanted       Procedure(s) with comments: CATARACT EXTRACTION PHACO AND INTRAOCULAR LENS PLACEMENT (IOC) RIGHT DIABETIC PANOPTIX  LENS 2.82 00:26.1 (Right) - Diabetic  TFNT00 +12.5   ULTRASOUND TIME: 0 minutes 26 seconds.  CDE 2.82   SURGEON:  Willey Blade, MD, MPH  ANESTHESIOLOGIST: Anesthesiologist: Ranee Gosselin, MD CRNA: Maree Krabbe, CRNA   ANESTHESIA:  Topical with tetracaine drops augmented with 1% preservative-free intracameral lidocaine.  ESTIMATED BLOOD LOSS: less than 1 mL.   COMPLICATIONS:  None.   DESCRIPTION OF PROCEDURE:  The patient was identified in the holding room and transported to the operating room and placed in the supine position under the operating microscope.  The right eye was identified as the operative eye and it was prepped and draped in the usual sterile ophthalmic fashion.   A 1.0 millimeter clear-corneal paracentesis was made at the 10:30 position. 0.5 ml of preservative-free 1% lidocaine with epinephrine was injected into the anterior chamber.  The anterior chamber was filled with Healon 5 viscoelastic.  A 2.4 millimeter keratome was used to make a near-clear corneal incision at the 8:00 position.  A curvilinear capsulorrhexis was made with a cystotome and capsulorrhexis forceps.  Balanced salt solution was used to hydrodissect and hydrodelineate the nucleus.   Phacoemulsification was then used in stop and chop fashion to remove the lens nucleus and epinucleus.   The remaining cortex was then removed using the irrigation and aspiration handpiece. Healon was then placed into the capsular bag to distend it for lens placement.  A lens was then injected into the capsular bag.  The remaining viscoelastic was aspirated.   Wounds were hydrated with balanced salt solution.  The anterior chamber was inflated to a physiologic pressure with balanced salt solution.   Intracameral vigamox 0.1 mL undiluted was injected into the eye and a drop placed onto the ocular surface.  The lens was well centered.  No wound leaks were noted.  The patient was taken to the recovery room in stable condition without complications of anesthesia or surgery  Willey Blade 08/26/2020, 7:52 AM

## 2020-08-26 NOTE — Anesthesia Preprocedure Evaluation (Signed)
Anesthesia Evaluation  Patient identified by MRN, date of birth, ID band Patient awake    Reviewed: Allergy & Precautions, H&P , NPO status , Patient's Chart, lab work & pertinent test results  Airway Mallampati: II  TM Distance: >3 FB Neck ROM: full    Dental no notable dental hx.    Pulmonary Current Smoker and Patient abstained from smoking.,    Pulmonary exam normal breath sounds clear to auscultation       Cardiovascular Exercise Tolerance: Good hypertension, Normal cardiovascular exam Rhythm:regular Rate:Normal     Neuro/Psych PSYCHIATRIC DISORDERS Anxiety negative neurological ROS     GI/Hepatic negative GI ROS, Neg liver ROS,   Endo/Other  negative endocrine ROSdiabetes, Type 2  Renal/GU Renal disease  negative genitourinary   Musculoskeletal  (+) Arthritis ,   Abdominal Normal abdominal exam  (+) - obese, scaphoid  Abdomen: soft.    Peds negative pediatric ROS (+)  Hematology negative hematology ROS (+)   Anesthesia Other Findings   Reproductive/Obstetrics negative OB ROS                             Anesthesia Physical  Anesthesia Plan  ASA: III  Anesthesia Plan: MAC   Post-op Pain Management:    Induction: Intravenous  PONV Risk Score and Plan: 1 and Treatment may vary due to age or medical condition, TIVA and Midazolam  Airway Management Planned: Natural Airway and Nasal Cannula  Additional Equipment:   Intra-op Plan:   Post-operative Plan: Extubation in OR  Informed Consent: I have reviewed the patients History and Physical, chart, labs and discussed the procedure including the risks, benefits and alternatives for the proposed anesthesia with the patient or authorized representative who has indicated his/her understanding and acceptance.     Dental Advisory Given  Plan Discussed with: CRNA  Anesthesia Plan Comments:         Anesthesia Quick  Evaluation  Patient Active Problem List   Diagnosis Date Noted  . Background diabetic retinopathy associated with type 2 diabetes mellitus ([place]) 07/10/2020  . Stomach cramps, generalized 05/08/2019  . Microalbuminuria due to type 2 diabetes mellitus ([place]) 05/03/2019  . Hypertriglyceridemia 05/03/2019  . Overweight (BMI 25.0-29.9) 05/03/2019  . Anxious mood 01/17/2019  . Insomnia 01/17/2019  . Hearing loss of left ear due to cerumen impaction 09/14/2018  . Mixed diabetic hyperlipidemia associated with type 2 diabetes mellitus ([place]) 11/12/2017  . Hypertension associated with diabetes ([place]) 11/12/2017  . OA (osteoarthritis) of hip 11/05/2015  . Vitamin D deficiency 11/05/2015  . smoking cessation counseling 11/05/2015  . Diabetes mellitus without complication ([place]) 16/02/9603  . Smoking 10/16/2015    CBC Latest Ref Rng & Units 06/13/2019 03/03/2018 10/18/2015  WBC 3.4 - 10.8 x10E3/uL 10.2 8.1 6.5  Hemoglobin 13.0 - 17.7 g/dL 17.3 17.3 15.8  Hematocrit 37.5 - 51.0 % 51.2(H) 50.8 47.4  Platelets 150 - 450 x10E3/uL 330 300 276   BMP Latest Ref Rng & Units 07/02/2020 03/20/2020 11/23/2019  Glucose 65 - 99 mg/dL 97 132(H) 128(H)  BUN 8 - 27 mg/dL _0 Creatinine 0.76 - 1.27 mg/dL 0.78 0.76 0.83  BUN/Creat Ratio 10 - _1 Sodium 134 - 144 mmol/L 139 138 135  Potassium 3.5 - 5.2 mmol/L 5.0 4.9 5.0  Chloride 96 - 106 mmol/L 101 99 98  CO2 20 - 29 mmol/L _2 Calcium 8.6 - 10.2 mg/dL 9.7 9.4 9.5  Risks and benefits of anesthesia discussed at length, patient or surrogate demonstrates understanding. Appropriately NPO. Plan to proceed with anesthesia.  08/26/20

## 2020-08-26 NOTE — Transfer of Care (Signed)
Immediate Anesthesia Transfer of Care Note  Patient: Jose Munoz  Procedure(s) Performed: CATARACT EXTRACTION PHACO AND INTRAOCULAR LENS PLACEMENT (IOC) RIGHT DIABETIC PANOPTIX  LENS 2.82 00:26.1 (Right Eye)  Patient Location: PACU  Anesthesia Type: MAC  Level of Consciousness: awake, alert  and patient cooperative  Airway and Oxygen Therapy: Patient Spontanous Breathing and Patient connected to supplemental oxygen  Post-op Assessment: Post-op Vital signs reviewed, Patient's Cardiovascular Status Stable, Respiratory Function Stable, Patent Airway and No signs of Nausea or vomiting  Post-op Vital Signs: Reviewed and stable  Complications: No complications documented.

## 2020-08-26 NOTE — Anesthesia Procedure Notes (Signed)
Procedure Name: MAC Date/Time: 08/26/2020 7:34 AM Performed by: Cameron Ali, CRNA Pre-anesthesia Checklist: Patient identified, Emergency Drugs available, Suction available, Timeout performed and Patient being monitored Patient Re-evaluated:Patient Re-evaluated prior to induction Oxygen Delivery Method: Nasal cannula Placement Confirmation: positive ETCO2

## 2020-08-27 ENCOUNTER — Encounter: Payer: Self-pay | Admitting: Ophthalmology

## 2020-09-12 ENCOUNTER — Ambulatory Visit: Payer: PPO | Admitting: Physician Assistant

## 2020-09-16 ENCOUNTER — Encounter: Payer: Self-pay | Admitting: Endocrinology

## 2020-09-24 ENCOUNTER — Ambulatory Visit (INDEPENDENT_AMBULATORY_CARE_PROVIDER_SITE_OTHER): Payer: PPO | Admitting: Physician Assistant

## 2020-09-24 ENCOUNTER — Other Ambulatory Visit: Payer: Self-pay

## 2020-09-24 ENCOUNTER — Encounter: Payer: Self-pay | Admitting: Physician Assistant

## 2020-09-24 VITALS — BP 129/72 | HR 88 | Temp 98.8°F | Ht 71.0 in | Wt 209.8 lb

## 2020-09-24 DIAGNOSIS — E1159 Type 2 diabetes mellitus with other circulatory complications: Secondary | ICD-10-CM

## 2020-09-24 DIAGNOSIS — E559 Vitamin D deficiency, unspecified: Secondary | ICD-10-CM

## 2020-09-24 DIAGNOSIS — E781 Pure hyperglyceridemia: Secondary | ICD-10-CM | POA: Diagnosis not present

## 2020-09-24 DIAGNOSIS — I152 Hypertension secondary to endocrine disorders: Secondary | ICD-10-CM

## 2020-09-24 DIAGNOSIS — E782 Mixed hyperlipidemia: Secondary | ICD-10-CM | POA: Diagnosis not present

## 2020-09-24 DIAGNOSIS — E1169 Type 2 diabetes mellitus with other specified complication: Secondary | ICD-10-CM

## 2020-09-24 LAB — POCT GLYCOSYLATED HEMOGLOBIN (HGB A1C): Hemoglobin A1C: 7 % — AB (ref 4.0–5.6)

## 2020-09-24 LAB — POCT UA - MICROALBUMIN
Albumin/Creatinine Ratio, Urine, POC: 30.3
Creatinine, POC: 100 mg/dL
Microalbumin Ur, POC: 80 mg/L

## 2020-09-24 NOTE — Assessment & Plan Note (Signed)
-  Controlled. -Continue current medication regimen. -Will continue to monitor. Will repeat CMP for medication monitoring.

## 2020-09-24 NOTE — Assessment & Plan Note (Signed)
-  Last lipid panel: total cholesterol 108, triglycerides 115, HDL 32 (low), LDL 55 (at goal <70). -Continue current medication regimen. -Will repeat hepatic function for medication monitoring. -Will continue to monitor.

## 2020-09-24 NOTE — Assessment & Plan Note (Signed)
-  Last Vitamin D 89.9, will repeat Vit D today. Pending results will adjust treatment plan or send additional refills of Vit D 50,000 units.

## 2020-09-24 NOTE — Progress Notes (Signed)
Established Patient Office Visit  Subjective:  Patient ID: Jose Munoz, male    DOB: May 05, 1951  Age: 70 y.o. MRN: [id number]  CC:  Chief Complaint  Patient presents with  . Diabetes  . Hypertension  . Hyperlipidemia    HPI [name] presents for follow up on diabetes mellitus, hypertension and hyperlipidemia. Patient reports is doing well, has no acute concerns.  Diabetes: Followed by Endocrinology. Pt denies increased urination or thirst. Pt reports medication compliance. No hypoglycemic events. Checking glucose at home. FBS average 115. Reports sometimes his sugar will be higher especially if he had pasta the evening prior.   HTN: Pt denies chest pain, palpitations, dizziness or lower extremity swelling. Taking medication as directed without side effects. Patient tries to stay hydrated.  HLD: Pt taking medication as directed without issues. Reports continues to stay active with playing golf three times per week.    Past Medical History:  Diagnosis Date  . Diabetes mellitus without complication (HCC)   . Essential hypertension 10/16/2015  . History of oral surgery   . Hyperlipidemia   . OA (osteoarthritis) of hip 11/05/2015  . Obese 10/16/2015  . Smoking   . Vitamin D deficiency 11/05/2015    Past Surgical History:  Procedure Laterality Date  . CATARACT EXTRACTION W/PHACO Left 08/12/2020   Procedure: CATARACT EXTRACTION PHACO AND INTRAOCULAR LENS PLACEMENT (IOC) LEFT DIABETIC PANOPTIX  TORIC LENS;  Surgeon: Nevada Crane, MD;  Location: [facility];  Service: Ophthalmology;  Laterality: Left;  3.75 0:30.1  . CATARACT EXTRACTION W/PHACO Right 08/26/2020   Procedure: CATARACT EXTRACTION PHACO AND INTRAOCULAR LENS PLACEMENT (IOC) RIGHT DIABETIC PANOPTIX  LENS 2.82 00:26.1;  Surgeon: Nevada Crane, MD;  Location: [facility];  Service: Ophthalmology;  Laterality: Right;  Diabetic    Family History  Problem Relation Age of Onset  . Diabetes  Mother   . Congestive Heart Failure Mother   . Cancer Sister        breast  . Cancer Sister        cervical  . Heart attack Brother     Social History   Socioeconomic History  . Marital status: Widowed    Spouse name: Not on file  . Number of children: Not on file  . Years of education: Not on file  . Highest education level: Not on file  Occupational History  . Not on file  Tobacco Use  . Smoking status: Current Every Day Smoker    Packs/day: 1.00    Years: 25.00    Pack years: 25.00    Types: Cigarettes  . Smokeless tobacco: Never Used  Vaping Use  . Vaping Use: Never used  Substance and Sexual Activity  . Alcohol use: Not Currently    Alcohol/week: 0.0 standard drinks  . Drug use: No  . Sexual activity: Never  Other Topics Concern  . Not on file  Social History Narrative  . Not on file   Social Determinants of Health   Financial Resource Strain: Not on file  Food Insecurity: Not on file  Transportation Needs: Not on file  Physical Activity: Not on file  Stress: Not on file  Social Connections: Not on file  Intimate Partner Violence: Not on file    Outpatient Medications Prior to Visit  Medication Sig Dispense Refill  . aspirin 81 MG tablet Take 81 mg by mouth daily.    . BD PEN NEEDLE NANO 2ND GEN 32G X 4 MM MISC USE TWICE DAILY 100  each 4  . glucose blood (ONETOUCH VERIO) test strip TEST TWICE DAILY DX CODE E11.9 100 each 5  . Insulin Aspart FlexPen 100 UNIT/ML SOPN INJECT 8-10 UNITS SUBCUTANEOUSLY BEFORE MAIN MEAL DAILY 9 mL 0  . INVOKAMET [phone] MG TABS TAKE 2 TABLETS BY MOUTH DAILY 180 tablet 1  . Lancets (ONETOUCH ULTRASOFT) lancets Use to test blood sugar 2 times daily- Dx code E11.9 100 each 12  . lisinopril (ZESTRIL) 10 MG tablet Take 1 tablet (10 mg total) by mouth daily. **NEEDS APT FOR REFILLS** 60 tablet 0  . simvastatin (ZOCOR) 40 MG tablet Take 1 tablet (40 mg total) by mouth daily at 6 PM. **NEEDS APT FOR REFILLS** 60 tablet 0  . TRESIBA  FLEXTOUCH 200 UNIT/ML FlexTouch Pen ADMINISTER 40 UNITS UNDER THE SKIN DAILY 15 mL 2  . Vitamin D, Ergocalciferol, (DRISDOL) 1.25 MG (50000 UT) CAPS capsule Take 1 capsule (50,000 Units total) by mouth every 7 (seven) days. 12 capsule 4   No facility-administered medications prior to visit.    No Known Allergies  ROS Review of Systems A fourteen system review of systems was performed and found to be positive as per HPI.  Objective:    Physical Exam General:  Pleasant and cooperative, in no acute distress  Neuro:  Alert and oriented,  extra-ocular muscles intact  HEENT:  Normocephalic, atraumatic, neck supple Skin:  no gross rash, warm, pink. Cardiac:  RRR, S1 S2 wnl's Respiratory:  ECTA B/L w/o wheezing, Not using accessory muscles, speaking in full sentences- unlabored. Vascular:  Ext warm, no cyanosis apprec.; cap RF less 2 sec. Psych:  No HI/SI, judgement and insight good, Euthymic mood. Full Affect.   BP 129/72   Pulse 88   Temp 98.8 F (37.1 C)   Ht 5\' 11"  (1.803 m)   Wt 209 lb 12.8 oz (95.2 kg)   SpO2 97%   BMI 29.26 kg/m  Wt Readings from Last 3 Encounters:  09/24/20 209 lb 12.8 oz (95.2 kg)  08/26/20 210 lb (95.3 kg)  08/12/20 210 lb (95.3 kg)     Health Maintenance Due  Topic Date Due  . Hepatitis C Screening  Never done  . COLONOSCOPY (Pts 45-9yrs Insurance coverage will need to be confirmed)  Never done    There are no preventive care reminders to display for this patient.  Lab Results  Component Value Date   TSH 1.950 06/13/2019   Lab Results  Component Value Date   WBC 10.2 06/13/2019   HGB 17.3 06/13/2019   HCT 51.2 (H) 06/13/2019   MCV 90 06/13/2019   PLT 330 06/13/2019   Lab Results  Component Value Date   NA 139 07/02/2020   K 5.0 07/02/2020   CO2 24 07/02/2020   GLUCOSE 97 07/02/2020   BUN 12 07/02/2020   CREATININE 0.78 07/02/2020   BILITOT 0.5 11/23/2019   ALKPHOS 77 11/23/2019   AST 15 11/23/2019   ALT 24 11/23/2019   PROT  7.7 11/23/2019   ALBUMIN 4.5 11/23/2019   CALCIUM 9.7 07/02/2020   GFR 113.09 12/02/2017   Lab Results  Component Value Date   CHOL 108 07/02/2020   Lab Results  Component Value Date   HDL 32 (L) 07/02/2020   Lab Results  Component Value Date   LDLCALC 55 07/02/2020   Lab Results  Component Value Date   TRIG 115 07/02/2020   Lab Results  Component Value Date   CHOLHDL 3.4 07/02/2020   Lab Results  Component  Value Date   HGBA1C 7.0 (A) 09/24/2020      Assessment & Plan:   Problem List Items Addressed This Visit      Cardiovascular and Mediastinum   Hypertension associated with diabetes (HCC)    -Controlled. -Continue current medication regimen. -Will continue to monitor. Will repeat CMP for medication monitoring.       Relevant Orders   Comprehensive metabolic panel     Endocrine   Mixed diabetic hyperlipidemia associated with type 2 diabetes mellitus (HCC)    -Last lipid panel: total cholesterol 108, triglycerides 115, HDL 32 (low), LDL 55 (at goal <70). -Continue current medication regimen. -Will repeat hepatic function for medication monitoring. -Will continue to monitor.        Other   Vitamin D deficiency (Chronic)    -Last Vitamin D 89.9, will repeat Vit D today. Pending results will adjust treatment plan or send additional refills of Vit D 50,000 units.      Relevant Orders   VITAMIN D 25 Hydroxy (Vit-D Deficiency, Fractures)    Other Visit Diagnoses    Type 2 diabetes mellitus with hypertriglyceridemia (HCC)    -  Primary   Relevant Orders   POCT glycosylated hemoglobin (Hb A1C) (Completed)   POCT UA - Microalbumin (Completed)     Type 2 diabetes mellitus with hypertriglyceridemia: -A1c today 7.0, stable. -Followed by Endocrinology, Dr. Lucianne Muss. -Continue current medication regimen. -UA microalbumin collected, stable. -Recommend to monitor carbohydrates and glucose.      No orders of the defined types were placed in this  encounter.   Follow-up: Return in about 3 months (around 12/25/2020) for MCW.  Note:  This note was prepared with assistance of Dragon voice recognition software. Occasional wrong-word or sound-a-like substitutions may have occurred due to the inherent limitations of voice recognition software.   Mayer Masker, PA-C

## 2020-09-24 NOTE — Patient Instructions (Addendum)
Diabetes Mellitus and Exercise Exercising regularly is important for overall health, especially for people who have diabetes mellitus. Exercising is not only about losing weight. It has many other health benefits, such as increasing muscle strength and bone density and reducing body fat and stress. This leads to improved fitness, flexibility, and endurance, all of which result in better overall health. What are the benefits of exercise if I have diabetes? Exercise has many benefits for people with diabetes. They include:  Helping to lower and control blood sugar (glucose).  Helping the body to respond better to the hormone insulin by improving insulin sensitivity.  Reducing how much insulin the body needs.  Lowering the risk for heart disease by: ? Lowering "bad" cholesterol and triglyceride levels. ? Increasing "good" cholesterol levels. ? Lowering blood pressure. ? Lowering blood glucose levels. What is my activity plan? Your health care provider or certified diabetes educator can help you make a plan for the type and frequency of exercise that works for you. This is called your activity plan. Be sure to:  Get at least 150 minutes of medium-intensity or high-intensity exercise each week. Exercises may include brisk walking, biking, or water aerobics.  Do stretching and strengthening exercises, such as yoga or weight lifting, at least 2 times a week.  Spread out your activity over at least 3 days of the week.  Get some form of physical activity each day. ? Do not go more than 2 days in a row without some kind of physical activity. ? Avoid being inactive for more than 90 minutes at a time. Take frequent breaks to walk or stretch.  Choose exercises or activities that you enjoy. Set realistic goals.  Start slowly and gradually increase your exercise intensity over time.   How do I manage my diabetes during exercise? Monitor your blood glucose  Check your blood glucose before and  after exercising. If your blood glucose is: ? 240 mg/dL (13.3 mmol/L) or higher before you exercise, check your urine for ketones. These are chemicals created by the liver. If you have ketones in your urine, do not exercise until your blood glucose returns to normal. ? 100 mg/dL (5.6 mmol/L) or lower, eat a snack containing 15-20 grams of carbohydrate. Check your blood glucose 15 minutes after the snack to make sure that your glucose level is above 100 mg/dL (5.6 mmol/L) before you start your exercise.  Know the symptoms of low blood glucose (hypoglycemia) and how to treat it. Your risk for hypoglycemia increases during and after exercise. Follow these tips and your health care provider's instructions  Keep a carbohydrate snack that is fast-acting for use before, during, and after exercise to help prevent or treat hypoglycemia.  Avoid injecting insulin into areas of the body that are going to be exercised. For example, avoid injecting insulin into: ? Your arms, when you are about to play tennis. ? Your legs, when you are about to go jogging.  Keep records of your exercise habits. Doing this can help you and your health care provider adjust your diabetes management plan as needed. Write down: ? Food that you eat before and after you exercise. ? Blood glucose levels before and after you exercise. ? The type and amount of exercise you have done.  Work with your health care provider when you start a new exercise or activity. He or she may need to: ? Make sure that the activity is safe for you. ? Adjust your insulin, other medicines, and food that   you eat.  Drink plenty of water while you exercise. This prevents loss of water (dehydration) and problems caused by a lot of heat in the body (heat stroke).   Where to find more information  American Diabetes Association: www.diabetes.org Summary  Exercising regularly is important for overall health, especially for people who have diabetes  mellitus.  Exercising has many health benefits. It increases muscle strength and bone density and reduces body fat and stress. It also lowers and controls blood glucose.  Your health care provider or certified diabetes educator can help you make an activity plan for the type and frequency of exercise that works for you.  Work with your health care provider to make sure any new activity is safe for you. Also work with your health care provider to adjust your insulin, other medicines, and the food you eat. This information is not intended to replace advice given to you by your health care provider. Make sure you discuss any questions you have with your health care provider. Document Revised: 01/30/2019 Document Reviewed: 01/30/2019 Elsevier Patient Education  2021 Elsevier Inc.  Diabetes Mellitus and Nutrition, Adult When you have diabetes, or diabetes mellitus, it is very important to have healthy eating habits because your blood sugar (glucose) levels are greatly affected by what you eat and drink. Eating healthy foods in the right amounts, at about the same times every day, can help you:  Control your blood glucose.  Lower your risk of heart disease.  Improve your blood pressure.  Reach or maintain a healthy weight. What can affect my meal plan? Every person with diabetes is different, and each person has different needs for a meal plan. Your health care provider may recommend that you work with a dietitian to make a meal plan that is best for you. Your meal plan may vary depending on factors such as:  The calories you need.  The medicines you take.  Your weight.  Your blood glucose, blood pressure, and cholesterol levels.  Your activity level.  Other health conditions you have, such as heart or kidney disease. How do carbohydrates affect me? Carbohydrates, also called carbs, affect your blood glucose level more than any other type of food. Eating carbs naturally raises the  amount of glucose in your blood. Carb counting is a method for keeping track of how many carbs you eat. Counting carbs is important to keep your blood glucose at a healthy level, especially if you use insulin or take certain oral diabetes medicines. It is important to know how many carbs you can safely have in each meal. This is different for every person. Your dietitian can help you calculate how many carbs you should have at each meal and for each snack. How does alcohol affect me? Alcohol can cause a sudden decrease in blood glucose (hypoglycemia), especially if you use insulin or take certain oral diabetes medicines. Hypoglycemia can be a life-threatening condition. Symptoms of hypoglycemia, such as sleepiness, dizziness, and confusion, are similar to symptoms of having too much alcohol.  Do not drink alcohol if: ? Your health care provider tells you not to drink. ? You are pregnant, may be pregnant, or are planning to become pregnant.  If you drink alcohol: ? Do not drink on an empty stomach. ? Limit how much you use to:  0-1 drink a day for women.  0-2 drinks a day for men. ? Be aware of how much alcohol is in your drink. In the [place] one drink equals one   12 oz bottle of beer (355 mL), one 5 oz glass of wine (148 mL), or one 1 oz glass of hard liquor (44 mL). ? Keep yourself hydrated with water, diet soda, or unsweetened iced tea.  Keep in mind that regular soda, juice, and other mixers may contain a lot of sugar and must be counted as carbs. What are tips for following this plan? Reading food labels  Start by checking the serving size on the "Nutrition Facts" label of packaged foods and drinks. The amount of calories, carbs, fats, and other nutrients listed on the label is based on one serving of the item. Many items contain more than one serving per package.  Check the total grams (g) of carbs in one serving. You can calculate the number of servings of carbs in one serving by  dividing the total carbs by 15. For example, if a food has 30 g of total carbs per serving, it would be equal to 2 servings of carbs.  Check the number of grams (g) of saturated fats and trans fats in one serving. Choose foods that have a low amount or none of these fats.  Check the number of milligrams (mg) of salt (sodium) in one serving. Most people should limit total sodium intake to less than 2,300 mg per day.  Always check the nutrition information of foods labeled as "low-fat" or "nonfat." These foods may be higher in added sugar or refined carbs and should be avoided.  Talk to your dietitian to identify your daily goals for nutrients listed on the label. Shopping  Avoid buying canned, pre-made, or processed foods. These foods tend to be high in fat, sodium, and added sugar.  Shop around the outside edge of the grocery store. This is where you will most often find fresh fruits and vegetables, bulk grains, fresh meats, and fresh dairy. Cooking  Use low-heat cooking methods, such as baking, instead of high-heat cooking methods like deep frying.  Cook using healthy oils, such as olive, canola, or sunflower oil.  Avoid cooking with butter, cream, or high-fat meats. Meal planning  Eat meals and snacks regularly, preferably at the same times every day. Avoid going long periods of time without eating.  Eat foods that are high in fiber, such as fresh fruits, vegetables, beans, and whole grains. Talk with your dietitian about how many servings of carbs you can eat at each meal.  Eat 4-6 oz (112-168 g) of lean protein each day, such as lean meat, chicken, fish, eggs, or tofu. One ounce (oz) of lean protein is equal to: ? 1 oz (28 g) of meat, chicken, or fish. ? 1 egg. ?  cup (62 g) of tofu.  Eat some foods each day that contain healthy fats, such as avocado, nuts, seeds, and fish.   What foods should I eat? Fruits Berries. Apples. Oranges. Peaches. Apricots. Plums. Grapes. Mango.  Papaya. Pomegranate. Kiwi. Cherries. Vegetables Lettuce. Spinach. Leafy greens, including kale, chard, collard greens, and mustard greens. Beets. Cauliflower. Cabbage. Broccoli. Carrots. Green beans. Tomatoes. Peppers. Onions. Cucumbers. Brussels sprouts. Grains Whole grains, such as whole-wheat or whole-grain bread, crackers, tortillas, cereal, and pasta. Unsweetened oatmeal. Quinoa. Brown or wild rice. Meats and other proteins Seafood. Poultry without skin. Lean cuts of poultry and beef. Tofu. Nuts. Seeds. Dairy Low-fat or fat-free dairy products such as milk, yogurt, and cheese. The items listed above may not be a complete list of foods and beverages you can eat. Contact a dietitian for more information. What   foods should I avoid? Fruits Fruits canned with syrup. Vegetables Canned vegetables. Frozen vegetables with butter or cream sauce. Grains Refined white flour and flour products such as bread, pasta, snack foods, and cereals. Avoid all processed foods. Meats and other proteins Fatty cuts of meat. Poultry with skin. Breaded or fried meats. Processed meat. Avoid saturated fats. Dairy Full-fat yogurt, cheese, or milk. Beverages Sweetened drinks, such as soda or iced tea. The items listed above may not be a complete list of foods and beverages you should avoid. Contact a dietitian for more information. Questions to ask a health care provider  Do I need to meet with a diabetes educator?  Do I need to meet with a dietitian?  What number can I call if I have questions?  When are the best times to check my blood glucose? Where to find more information:  American Diabetes Association: diabetes.org  Academy of Nutrition and Dietetics: www.eatright.org  National Institute of Diabetes and Digestive and Kidney Diseases: www.niddk.nih.gov  Association of Diabetes Care and Education Specialists: www.diabeteseducator.org Summary  It is important to have healthy eating habits  because your blood sugar (glucose) levels are greatly affected by what you eat and drink.  A healthy meal plan will help you control your blood glucose and maintain a healthy lifestyle.  Your health care provider may recommend that you work with a dietitian to make a meal plan that is best for you.  Keep in mind that carbohydrates (carbs) and alcohol have immediate effects on your blood glucose levels. It is important to count carbs and to use alcohol carefully. This information is not intended to replace advice given to you by your health care provider. Make sure you discuss any questions you have with your health care provider. Document Revised: 04/11/2019 Document Reviewed: 04/11/2019 Elsevier Patient Education  2021 Elsevier Inc.  

## 2020-09-25 ENCOUNTER — Other Ambulatory Visit: Payer: Self-pay | Admitting: Physician Assistant

## 2020-09-25 DIAGNOSIS — E559 Vitamin D deficiency, unspecified: Secondary | ICD-10-CM

## 2020-09-25 LAB — COMPREHENSIVE METABOLIC PANEL
ALT: 20 IU/L (ref 0–44)
AST: 20 IU/L (ref 0–40)
Albumin/Globulin Ratio: 1.7 (ref 1.2–2.2)
Albumin: 4.8 g/dL — ABNORMAL HIGH (ref 3.7–4.7)
Alkaline Phosphatase: 74 IU/L (ref 44–121)
BUN/Creatinine Ratio: 15 (ref 10–24)
BUN: 12 mg/dL (ref 8–27)
Bilirubin Total: <0.2 mg/dL (ref 0.0–1.2)
CO2: 17 mmol/L — ABNORMAL LOW (ref 20–29)
Calcium: 10 mg/dL (ref 8.6–10.2)
Chloride: 101 mmol/L (ref 96–106)
Creatinine, Ser: 0.78 mg/dL (ref 0.76–1.27)
Globulin, Total: 2.9 g/dL (ref 1.5–4.5)
Glucose: 129 mg/dL — ABNORMAL HIGH (ref 65–99)
Potassium: 5.2 mmol/L (ref 3.5–5.2)
Sodium: 138 mmol/L (ref 134–144)
Total Protein: 7.7 g/dL (ref 6.0–8.5)
eGFR: 95 mL/min/{1.73_m2} (ref >59)

## 2020-09-25 LAB — VITAMIN D 25 HYDROXY (VIT D DEFICIENCY, FRACTURES): Vit D, 25-Hydroxy: 37.8 ng/mL (ref 30.0–100.0)

## 2020-09-25 MED ORDER — VITAMIN D (ERGOCALCIFEROL) 1.25 MG (50000 UNIT) PO CAPS: 50000.0000 [IU] | ORAL_CAPSULE | ORAL | 1 refills | Status: DC

## 2020-09-27 ENCOUNTER — Other Ambulatory Visit: Payer: Self-pay | Admitting: Endocrinology

## 2020-09-30 ENCOUNTER — Telehealth: Payer: Self-pay | Admitting: Endocrinology

## 2020-09-30 NOTE — Telephone Encounter (Signed)
Pt calling in stating that he does not have enough of the  Insulin Aspart FlexPen 100 UNIT/ML SOPN to last him until his app which is on 10/10/2020 and would like to see if he could get enough to last him until his app.  [facility] [phone] - [place],  - [address] AT [facility] OF [address] & [address]

## 2020-10-01 ENCOUNTER — Other Ambulatory Visit: Payer: Self-pay | Admitting: Physician Assistant

## 2020-10-01 ENCOUNTER — Other Ambulatory Visit: Payer: Self-pay | Admitting: *Deleted

## 2020-10-01 DIAGNOSIS — E1159 Type 2 diabetes mellitus with other circulatory complications: Secondary | ICD-10-CM

## 2020-10-01 DIAGNOSIS — E1169 Type 2 diabetes mellitus with other specified complication: Secondary | ICD-10-CM

## 2020-10-01 DIAGNOSIS — I152 Hypertension secondary to endocrine disorders: Secondary | ICD-10-CM

## 2020-10-01 MED ORDER — INSULIN ASPART FLEXPEN 100 UNIT/ML ~~LOC~~ SOPN
PEN_INJECTOR | SUBCUTANEOUS | 0 refills | Status: DC
Start: 2020-10-01 — End: 2021-01-21

## 2020-10-01 NOTE — Telephone Encounter (Signed)
Rx sent 

## 2020-10-10 ENCOUNTER — Ambulatory Visit (INDEPENDENT_AMBULATORY_CARE_PROVIDER_SITE_OTHER): Payer: PPO | Admitting: Endocrinology

## 2020-10-10 ENCOUNTER — Encounter: Payer: Self-pay | Admitting: Endocrinology

## 2020-10-10 ENCOUNTER — Other Ambulatory Visit: Payer: Self-pay

## 2020-10-10 VITALS — BP 122/78 | HR 80 | Ht 71.0 in | Wt 210.4 lb

## 2020-10-10 DIAGNOSIS — E1165 Type 2 diabetes mellitus with hyperglycemia: Secondary | ICD-10-CM | POA: Diagnosis not present

## 2020-10-10 DIAGNOSIS — Z794 Long term (current) use of insulin: Secondary | ICD-10-CM

## 2020-10-10 NOTE — Patient Instructions (Addendum)
Vitamin D monthly  Check blood sugars on 2-3 waking up days a week  Also check blood sugars about 2 hours after meals and do this after different meals by rotation  Recommended blood sugar levels on waking up are 90-130 and about 2 hours after meal is 130-160  Please bring your blood sugar monitor to each visit, thank you

## 2020-10-10 NOTE — Progress Notes (Signed)
patient ID: Jose Munoz, male   DOB: 12-17-1950, 70 y.o.   MRN: [id number]           Reason for Appointment:  Follow-up for Type 2 Diabetes   History of Present Illness:          Date of diagnosis of type 2 diabetes mellitus: 1998       Background history:   He thinks his diabetes was relatively easier to control and the onset even though he had initial symptoms of thirst and weight loss He had done very well with his diet and lost a significant amount of weight and was approved to control his diabetes without medications for a few years Subsequently he started with oral medications including metformin, Actos and Amaryl He thinks his blood sugars were well controlled for a few years initially but started getting higher progressively since 2016. Because of poor control he was started on basal bolus insulin regimen on his initial consultation, highest A1c was 11.9 in 01/2016  Recent history:    INSULIN regimen: TRESIBA 42 units daily at bedtime, Novolog 0-8 at lunch, 10 units at dinner  Non-insulin hypoglycemic drugs the patient is taking are:Invokamet 150/1000 twice a day  Current management, blood sugar patterns and problems identified:  His A1c is about the same at 7 compared to 6.9 and Verio the last year  Previously had lost weight and this has leveled off  He is playing golf and also doing a little more walking and overall  He forgets to check his readings after meals and not clear if he is taking appropriate amounts of insulin for lunch and dinner  He is not able to understand how to adjust his mealtime dose based on portions and carbohydrate intake at dinnertime  However does not report any symptoms of low sugars after dinner  Has continued on the same dose of Tresiba although blood sugars may be slightly higher than before on an average in the morning  No side effects with Invokamet  Renal function stable with Invokana        Side effects from medications have  been: None  Compliance with the medical regimen: Fair  Glucose monitoring:  done about 1 times a day         Glucometer:   One Touch Verio  Blood sugars by download  FASTING range 99-132, after breakfast 155 Average 120  Previously:  PRE-MEAL Fasting Lunch Dinner Bedtime Overall  Glucose range:  83-124  112     Mean/median:  103    110   POST-MEAL PC Breakfast PC Lunch PC Dinner  Glucose range:   174 71-150  Mean/median:    110     FASTING range 122-160, average 140 After dinner 148, 155   Self-care: The diet that the patient has been following is: tries to limit sweets and drinks with sugar .      Typical meal intake: Breakfast is egg, toast occasionally, sometimes skipped or; Lunch is a sandwich, has various snacks    Dinner 5-7 P.m.            Dietician visit, most recent: 06/2016 CDE visit: 11/17              Weight history:  Wt Readings from Last 3 Encounters:  10/10/20 210 lb 6 oz (95.4 kg)  09/24/20 209 lb 12.8 oz (95.2 kg)  08/26/20 210 lb (95.3 kg)    Glycemic control:   Lab Results  Component Value Date  HGBA1C 7.0 (A) 09/24/2020   HGBA1C 6.9 (H) 07/02/2020   HGBA1C 7.7 (H) 03/20/2020   Lab Results  Component Value Date   MICROALBUR 80 09/24/2020   LDLCALC 55 07/02/2020   CREATININE 0.78 09/24/2020   Lab Results  Component Value Date   MICRALBCREAT 30.300 09/24/2020       Allergies as of 10/10/2020   No Known Allergies     Medication List       Accurate as of Oct 10, 2020  9:20 AM. If you have any questions, ask your nurse or doctor.        aspirin 81 MG tablet Take 81 mg by mouth daily.   BD Pen Needle Nano 2nd Gen 32G X 4 MM Misc Generic drug: Insulin Pen Needle USE TWICE DAILY   Insulin Aspart FlexPen 100 UNIT/ML Sopn INJECT 8-10 UNITS SUBCUTANEOUSLY BEFORE MAIN MEAL DAILY   Invokamet [phone] MG Tabs Generic drug: Canagliflozin-metFORMIN HCl TAKE 2 TABLETS BY MOUTH DAILY   lisinopril 10 MG tablet Commonly known  as: ZESTRIL TAKE 1 TABLET(10 MG) BY MOUTH DAILY   onetouch ultrasoft lancets Use to test blood sugar 2 times daily- Dx code E11.9   OneTouch Verio test strip Generic drug: glucose blood TEST TWICE DAILY DX CODE E11.9   simvastatin 40 MG tablet Commonly known as: ZOCOR TAKE 1 TABLET(40 MG) BY MOUTH DAILY AT 6 PM   Tresiba FlexTouch 200 UNIT/ML FlexTouch Pen Generic drug: insulin degludec ADMINISTER 40 UNITS UNDER THE SKIN DAILY   Vitamin D (Ergocalciferol) 1.25 MG (50000 UNIT) Caps capsule Commonly known as: DRISDOL Take 1 capsule (50,000 Units total) by mouth every 7 (seven) days.       Allergies: No Known Allergies  Past Medical History:  Diagnosis Date  . Diabetes mellitus without complication ([place])   . Essential hypertension 10/16/2015  . History of oral surgery   . Hyperlipidemia   . OA (osteoarthritis) of hip 11/05/2015  . Obese 10/16/2015  . Smoking   . Vitamin D deficiency 11/05/2015    Past Surgical History:  Procedure Laterality Date  . CATARACT EXTRACTION W/PHACO Left 08/12/2020   Procedure: CATARACT EXTRACTION PHACO AND INTRAOCULAR LENS PLACEMENT ([place]) LEFT DIABETIC [place];  Surgeon: Eulogio Bear, MD;  Location: [facility];  Service: Ophthalmology;  Laterality: Left;  3.75 0:30.1  . CATARACT EXTRACTION W/PHACO Right 08/26/2020   Procedure: CATARACT EXTRACTION PHACO AND INTRAOCULAR LENS PLACEMENT ([facility]) RIGHT DIABETIC [place]  LENS 2.82 00:26.1;  Surgeon: Eulogio Bear, MD;  Location: [facility];  Service: Ophthalmology;  Laterality: Right;  Diabetic    Family History  Problem Relation Age of Onset  . Diabetes Mother   . Congestive Heart Failure Mother   . Cancer Sister        breast  . Cancer Sister        cervical  . Heart attack Brother     Social History:  reports that he has been smoking cigarettes. He has a 25.00 pack-year smoking history. He has never used smokeless tobacco. He reports previous alcohol  use. He reports that he does not use drugs.   Review of Systems   Lipid history: Has good LDL levels with taking simvastatin 40 mg prescribed by his PCP HDL low but triglycerides are normal    Lab Results  Component Value Date   CHOL 108 07/02/2020   HDL 32 (L) 07/02/2020   LDLCALC 55 07/02/2020   TRIG 115 07/02/2020   CHOLHDL 3.4 07/02/2020  Hypertension:  is being controlled with lisinopril 28m Also on Invokana  Blood pressure being checked at home also: Yesterday 117/64  BP Readings from Last 3 Encounters:  10/10/20 122/78  09/24/20 129/72  08/26/20 114/76   Potassium is upper normal again   Lab Results  Component Value Date   K 5.2 09/24/2020    Has eye exams regularly, was told to have mild background retinopathy   Most recent foot exam: 8/21  Vitmain D weekly   LABS:  No visits with results within 1 Week(s) from this visit.  Latest known visit with results is:  Office Visit on 09/24/2020  Component Date Value Ref Range Status  . Hemoglobin A1C 09/24/2020 7.0* 4.0 - 5.6 % Final  . Microalbumin Ur, POC 09/24/2020 80  mg/L Final  . Creatinine, POC 09/24/2020 100  mg/dL Final  . Albumin/Creatinine Ratio, Urine, P* 09/24/2020 30.300   Final  . Glucose 09/24/2020 129* 65 - 99 mg/dL Final  . BUN 09/24/2020 12  8 - 27 mg/dL Final  . Creatinine, Ser 09/24/2020 0.78  0.76 - 1.27 mg/dL Final  . eGFR 09/24/2020 95  >59 mL/min/1.73 Final  . BUN/Creatinine Ratio 09/24/2020 15  10 - 24 Final  . Sodium 09/24/2020 138  134 - 144 mmol/L Final  . Potassium 09/24/2020 5.2  3.5 - 5.2 mmol/L Final  . Chloride 09/24/2020 101  96 - 106 mmol/L Final  . CO2 09/24/2020 17* 20 - 29 mmol/L Final  . Calcium 09/24/2020 10.0  8.6 - 10.2 mg/dL Final  . Total Protein 09/24/2020 7.7  6.0 - 8.5 g/dL Final  . Albumin 09/24/2020 4.8* 3.7 - 4.7 g/dL Final  . Globulin, Total 09/24/2020 2.9  1.5 - 4.5 g/dL Final  . Albumin/Globulin Ratio 09/24/2020 1.7  1.2 - 2.2 Final  .  Bilirubin Total 09/24/2020 <0.2  0.0 - 1.2 mg/dL Final  . Alkaline Phosphatase 09/24/2020 74  44 - 121 IU/L Final  . AST 09/24/2020 20  0 - 40 IU/L Final  . ALT 09/24/2020 20  0 - 44 IU/L Final  . Vit D, 25-Hydroxy 09/24/2020 37.8  30.0 - 100.0 ng/mL Final   Comment: Vitamin D deficiency has been defined by the ISatillapractice guideline as a level of serum 25-OH vitamin D less than 20 ng/mL (1,2). The Endocrine Society went on to further define vitamin D insufficiency as a level between 21 and 29 ng/mL (2). 1. IOM (Institute of Medicine). 2010. Dietary reference    intakes for calcium and D. [place] The    [employer] 2. Holick MF, Binkley [place], Bischoff-Ferrari HA, et al.    Evaluation, treatment, and prevention of vitamin D    deficiency: an Endocrine Society clinical practice    guideline. JCEM. 2011 Jul; 96(7):1911-30.     Physical Examination:  BP 122/78   Pulse 80   Ht 5' 11"  (1.803 m)   Wt 210 lb 6 oz (95.4 kg)   SpO2 97%   BMI 29.34 kg/m   Standing blood pressure 126/68   ASSESSMENT:  Diabetes type 2, insulin-dependent  See history of present illness for detailed discussion of current diabetes management, blood sugar patterns and problems identified  His A1c is stable at 7%  He is on Invokamet and basal bolus insulin with relatively low doses of mealtime insulin mostly at dinnertime Fasting readings are generally fairly good but needs to check more readings after dinner  MICROALBUMINURIA: Likely improving, will follow up  in 6 months   Hypertension: On lisinopril 10 mg, well-controlled and also monitoring at home Renal function is normal but potassium is upper normal again  History of vitamin D deficiency: Currently taking prescription weekly dose which he recently started However discussed that since previously had level up to 90 he can take this only once a month  LIPIDS: Follow-up to be done on  the next visit  PLAN:    He will need to adjust his suppertime dose based on the type of meal and carbohydrate content  Likely needs 12 to 13 units for higher carbohydrate meals or larger meals such as pasta  Conversely for salads and low carbohydrate meals he can cut it down to 8 units  Again needs to consistently check to see if he is getting good readings after lunch without regular doses of insulin  No change in [place] unless fasting glucose goes out of range  Regular exercise  Continue monitoring blood pressure at home  Continue lisinopril but consider switching to losartan or olmesartan for potentially less tendency to hyperkalemia, will defer to PCP Other recommendations as above  There are no Patient Instructions on file for this visit.     Elayne Snare 10/10/2020, 9:20 AM   Note: This office note was prepared with Dragon voice recognition system technology. Any transcriptional errors that result from this process are unintentional.

## 2020-11-20 ENCOUNTER — Other Ambulatory Visit: Payer: Self-pay | Admitting: Endocrinology

## 2020-11-21 ENCOUNTER — Other Ambulatory Visit: Payer: Self-pay | Admitting: Endocrinology

## 2020-11-21 DIAGNOSIS — E1165 Type 2 diabetes mellitus with hyperglycemia: Secondary | ICD-10-CM

## 2020-12-16 ENCOUNTER — Telehealth: Payer: Self-pay | Admitting: Endocrinology

## 2020-12-16 NOTE — Telephone Encounter (Signed)
Patient called re: Patient requests the Orders for prior labs that are done at his PCP (patient is scheduled with Dr. Lucianne Muss on 02/11/21). Patient requests the above Lab Orders be entered into Epic no later than 02/01/21.

## 2020-12-17 ENCOUNTER — Other Ambulatory Visit: Payer: Self-pay | Admitting: Endocrinology

## 2020-12-17 DIAGNOSIS — E782 Mixed hyperlipidemia: Secondary | ICD-10-CM

## 2020-12-17 DIAGNOSIS — Z794 Long term (current) use of insulin: Secondary | ICD-10-CM

## 2020-12-17 DIAGNOSIS — E1165 Type 2 diabetes mellitus with hyperglycemia: Secondary | ICD-10-CM

## 2020-12-17 NOTE — Telephone Encounter (Signed)
Called and notified patient.

## 2021-01-18 ENCOUNTER — Other Ambulatory Visit: Payer: Self-pay | Admitting: Endocrinology

## 2021-01-21 ENCOUNTER — Other Ambulatory Visit: Payer: Self-pay

## 2021-01-21 DIAGNOSIS — E1159 Type 2 diabetes mellitus with other circulatory complications: Secondary | ICD-10-CM

## 2021-01-21 DIAGNOSIS — E1169 Type 2 diabetes mellitus with other specified complication: Secondary | ICD-10-CM

## 2021-01-21 DIAGNOSIS — I152 Hypertension secondary to endocrine disorders: Secondary | ICD-10-CM

## 2021-01-21 MED ORDER — LISINOPRIL 10 MG PO TABS: ORAL_TABLET | ORAL | 0 refills | Status: DC

## 2021-01-21 MED ORDER — SIMVASTATIN 40 MG PO TABS: ORAL_TABLET | ORAL | 0 refills | Status: DC

## 2021-02-04 ENCOUNTER — Ambulatory Visit (INDEPENDENT_AMBULATORY_CARE_PROVIDER_SITE_OTHER): Payer: PPO | Admitting: Physician Assistant

## 2021-02-04 ENCOUNTER — Encounter: Payer: Self-pay | Admitting: Physician Assistant

## 2021-02-04 ENCOUNTER — Other Ambulatory Visit: Payer: Self-pay

## 2021-02-04 VITALS — BP 109/69 | HR 90 | Temp 99.0°F | Ht 70.5 in | Wt 205.0 lb

## 2021-02-04 DIAGNOSIS — Z23 Encounter for immunization: Secondary | ICD-10-CM

## 2021-02-04 DIAGNOSIS — Z0000 Encounter for general adult medical examination without abnormal findings: Secondary | ICD-10-CM

## 2021-02-04 DIAGNOSIS — E119 Type 2 diabetes mellitus without complications: Secondary | ICD-10-CM | POA: Diagnosis not present

## 2021-02-04 LAB — POCT GLYCOSYLATED HEMOGLOBIN (HGB A1C): Hemoglobin A1C: 6.4 % — AB (ref 4.0–5.6)

## 2021-02-04 NOTE — Patient Instructions (Signed)
Preventive Care 65 Years and Older, Male Preventive care refers to lifestyle choices and visits with your health care provider that can promote health and wellness. This includes: A yearly physical exam. This is also called an annual wellness visit. Regular dental and eye exams. Immunizations. Screening for certain conditions. Healthy lifestyle choices, such as: Eating a healthy diet. Getting regular exercise. Not using drugs or products that contain nicotine and tobacco. Limiting alcohol use. What can I expect for my preventive care visit? Physical exam Your health care provider will check your: Height and weight. These may be used to calculate your BMI (body mass index). BMI is a measurement that tells if you are at a healthy weight. Heart rate and blood pressure. Body temperature. Skin for abnormal spots. Counseling Your health care provider may ask you questions about your: Past medical problems. Family's medical history. Alcohol, tobacco, and drug use. Emotional well-being. Home life and relationship well-being. Sexual activity. Diet, exercise, and sleep habits. History of falls. Memory and ability to understand (cognition). Work and work environment. Access to firearms. What immunizations do I need? Vaccines are usually given at various ages, according to a schedule. Your health care provider will recommend vaccines for you based on your age, medical history, and lifestyle or other factors, such as travel or where you work. What tests do I need? Blood tests Lipid and cholesterol levels. These may be checked every 5 years, or more often depending on your overall health. Hepatitis C test. Hepatitis B test. Screening Lung cancer screening. You may have this screening every year starting at age 55 if you have a 30-pack-year history of smoking and currently smoke or have quit within the past 15 years. Colorectal cancer screening. All adults should have this screening  starting at age 50 and continuing until age 75. Your health care provider may recommend screening at age 45 if you are at increased risk. You will have tests every 1-10 years, depending on your results and the type of screening test. Prostate cancer screening. Recommendations will vary depending on your family history and other risks. Genital exam to check for testicular cancer or hernias. Diabetes screening. This is done by checking your blood sugar (glucose) after you have not eaten for a while (fasting). You may have this done every 1-3 years. Abdominal aortic aneurysm (AAA) screening. You may need this if you are a current or former smoker. STD (sexually transmitted disease) testing, if you are at risk. Follow these instructions at home: Eating and drinking  Eat a diet that includes fresh fruits and vegetables, whole grains, lean protein, and low-fat dairy products. Limit your intake of foods with high amounts of sugar, saturated fats, and salt. Take vitamin and mineral supplements as recommended by your health care provider. Do not drink alcohol if your health care provider tells you not to drink. If you drink alcohol: Limit how much you have to 0-2 drinks a day. Be aware of how much alcohol is in your drink. In the [place] one drink equals one 12 oz bottle of beer (355 mL), one 5 oz glass of wine (148 mL), or one 1 oz glass of hard liquor (44 mL). Lifestyle Take daily care of your teeth and gums. Brush your teeth every morning and night with fluoride toothpaste. Floss one time each day. Stay active. Exercise for at least 30 minutes 5 or more days each week. Do not use any products that contain nicotine or tobacco, such as cigarettes, e-cigarettes, and chewing tobacco. If   you need help quitting, ask your health care provider. Do not use drugs. If you are sexually active, practice safe sex. Use a condom or other form of protection to prevent STIs (sexually transmitted infections). Talk  with your health care provider about taking a low-dose aspirin or statin. Find healthy ways to cope with stress, such as: Meditation, yoga, or listening to music. Journaling. Talking to a trusted person. Spending time with friends and family. Safety Always wear your seat belt while driving or riding in a vehicle. Do not drive: If you have been drinking alcohol. Do not ride with someone who has been drinking. When you are tired or distracted. While texting. Wear a helmet and other protective equipment during sports activities. If you have firearms in your house, make sure you follow all gun safety procedures. What's next? Visit your health care provider once a year for an annual wellness visit. Ask your health care provider how often you should have your eyes and teeth checked. Stay up to date on all vaccines. This information is not intended to replace advice given to you by your health care provider. Make sure you discuss any questions you have with your health care provider. Document Revised: 07/12/2020 Document Reviewed: 04/28/2018 Elsevier Patient Education  2022 Elsevier Inc.   

## 2021-02-04 NOTE — Progress Notes (Signed)
Subjective:   Jose Munoz is a 70 y.o. male who presents for Medicare Annual/Subsequent preventive examination.  Review of Systems    General:   No F/C, wt loss Pulm:   No DIB, SOB, pleuritic chest pain Card:  No CP, palpitations Abd:  No n/v/d or pain Ext:  No inc edema from baseline    Objective:    Today's Vitals   02/04/21 1335  BP: 109/69  Pulse: 90  Temp: 99 F (37.2 C)  SpO2: 98%  Weight: 205 lb (93 kg)  Height: 5' 10.5" (1.791 m)   Body mass index is 29 kg/m.  Advanced Directives 08/26/2020 08/12/2020 07/02/2016 10/16/2015  Does Patient Have a Medical Advance Directive? Yes Yes Yes Yes  Type of Estate agent of [place];Living will Healthcare Power of [place];Living will - Healthcare Power of [place];Living will  Does patient want to make changes to medical advance directive? No - Patient declined No - Patient declined - No - Patient declined  Copy of Healthcare Power of Attorney in Chart? Yes - validated most recent copy scanned in chart (See row information) Yes - validated most recent copy scanned in chart (See row information) - -    Current Medications (verified) Outpatient Encounter Medications as of 02/04/2021  Medication Sig   aspirin 81 MG tablet Take 81 mg by mouth daily.   BD PEN NEEDLE NANO 2ND GEN 32G X 4 MM MISC USE TWICE DAILY   glucose blood (ONETOUCH VERIO) test strip TEST TWICE DAILY DX CODE E11.9   Insulin Aspart FlexPen (NOVOLOG) 100 UNIT/ML INJECY 8-10 UNITS UNDER THE SKIN BEFORE MAIN MEAL DAILY   INVOKAMET 667 [id number] 3528 MG TABS TAKE 2 TABLETS BY MOUTH DAILY   Lancets (ONETOUCH ULTRASOFT) lancets Use to test blood sugar 2 times daily- Dx code E11.9   lisinopril (ZESTRIL) 10 MG tablet TAKE 1 TABLET(10 MG) BY MOUTH DAILY   simvastatin (ZOCOR) 40 MG tablet TAKE 1 TABLET(40 MG) BY MOUTH DAILY AT 6 PM   TRESIBA FLEXTOUCH 200 UNIT/ML FlexTouch Pen ADMINISTER 40 UNITS UNDER THE SKIN DAILY   Vitamin D, Ergocalciferol, (DRISDOL) 1.25  MG (50000 UNIT) CAPS capsule Take 1 capsule (50,000 Units total) by mouth every 7 (seven) days.   No facility-administered encounter medications on file as of 02/04/2021.    Allergies (verified) Patient has no known allergies.   History: Past Medical History:  Diagnosis Date   Diabetes mellitus without complication (HCC)    Essential hypertension 10/16/2015   History of oral surgery    Hyperlipidemia    OA (osteoarthritis) of hip 11/05/2015   Obese 10/16/2015   Smoking    Vitamin D deficiency 11/05/2015   Past Surgical History:  Procedure Laterality Date   CATARACT EXTRACTION W/PHACO Left 08/12/2020   Procedure: CATARACT EXTRACTION PHACO AND INTRAOCULAR LENS PLACEMENT (IOC) LEFT DIABETIC PANOPTIX  TORIC LENS;  Surgeon: Nevada Crane, MD;  Location: [facility];  Service: Ophthalmology;  Laterality: Left;  3.75 0:30.1   CATARACT EXTRACTION W/PHACO Right 08/26/2020   Procedure: CATARACT EXTRACTION PHACO AND INTRAOCULAR LENS PLACEMENT (IOC) RIGHT DIABETIC PANOPTIX  LENS 2.82 00:26.1;  Surgeon: Nevada Crane, MD;  Location: [facility];  Service: Ophthalmology;  Laterality: Right;  Diabetic   Family History  Problem Relation Age of Onset   Diabetes Mother    Congestive Heart Failure Mother    Cancer Sister        breast   Cancer Sister        cervical  Heart attack Brother    Social History   Socioeconomic History   Marital status: Widowed    Spouse name: Not on file   Number of children: Not on file   Years of education: Not on file   Highest education level: Not on file  Occupational History   Not on file  Tobacco Use   Smoking status: Every Day    Packs/day: 1.00    Years: 25.00    Pack years: 25.00    Types: Cigarettes   Smokeless tobacco: Never  Vaping Use   Vaping Use: Never used  Substance and Sexual Activity   Alcohol use: Not Currently    Alcohol/week: 0.0 standard drinks   Drug use: No   Sexual activity: Never  Other Topics  Concern   Not on file  Social History Narrative   Not on file   Social Determinants of Health   Financial Resource Strain: Not on file  Food Insecurity: Not on file  Transportation Needs: Not on file  Physical Activity: Not on file  Stress: Not on file  Social Connections: Not on file    Tobacco Counseling Ready to quit: Not Answered Counseling given: Not Answered     Diabetic? Yes, controlled. Followed by endocrinology.    Activities of Daily Living In your present state of health, do you have any difficulty performing the following activities: 02/04/2021 09/24/2020  Hearing? N N  Vision? N N  Difficulty concentrating or making decisions? N N  Walking or climbing stairs? N N  Dressing or bathing? N N  Doing errands, shopping? N N  Some recent data might be hidden    Patient Care Team: Mayer Masker, PA-C as PCP - General Dingeldein, Viviann Spare, MD (Ophthalmology)  Indicate any recent Medical Services you may have received from other than [facility] providers in the past year (date may be approximate).     Assessment:   This is a routine wellness examination for Jose Munoz.  Hearing/Vision screen No results found.  Dietary issues and exercise activities discussed: -Plays golf 2-3 times per week and walks on treadmill. Discussed low fat and carbohydrate diet.   Goals Addressed   None   Depression Screen PHQ 2/9 Scores 02/04/2021 09/24/2020 01/11/2020 05/26/2019 05/03/2019 03/10/2018 11/12/2017  PHQ - 2 Score 0 0 0 0 0 0 0  PHQ- 9 Score 0 0 0 1 1 0 0    Fall Risk Fall Risk  02/04/2021 09/24/2020 01/11/2020 05/26/2019 05/03/2019  Falls in the past year? 0 0 0 0 0  Number falls in past yr: 0 0 - 0 0  Injury with Fall? 0 0 - 0 0  Risk for fall due to : No Fall Risks No Fall Risks - - -  Follow up Falls evaluation completed Falls evaluation completed Falls evaluation completed Falls evaluation completed Falls evaluation completed    FALL RISK PREVENTION PERTAINING TO THE  HOME:  Any stairs in or around the home? Yes  If so, are there any without handrails? No  Home free of loose throw rugs in walkways, pet beds, electrical cords, etc? Yes  Adequate lighting in your home to reduce risk of falls? Yes   ASSISTIVE DEVICES UTILIZED TO PREVENT FALLS:  Life alert? No  Use of a cane, walker or w/c? No  Grab bars in the bathroom? No  Shower chair or bench in shower? No  Elevated toilet seat or a handicapped toilet? Yes   TIMED UP AND GO:  Was the test performed? Yes .  Length of time to ambulate 10 feet: 10 sec.   Gait slow and steady without use of assistive device  Cognitive Function: wnl's   6CIT Screen 02/04/2021 05/26/2019 05/17/2017  What Year? 0 points 0 points 0 points  What month? 0 points 0 points 0 points  What time? 0 points 0 points 0 points  Count back from 20 0 points 0 points 0 points  Months in reverse 0 points 0 points 0 points  Repeat phrase 0 points 0 points 0 points  Total Score 0 0 0    Immunizations Immunization History  Administered Date(s) Administered   Fluad Quad(high Dose 65+) 02/04/2021   Influenza, High Dose Seasonal PF 02/21/2016, 05/17/2017, 04/12/2018, 04/02/2020   Influenza-Unspecified 02/27/2010, 03/23/2011, 02/10/2012, 04/12/2013, 05/04/2014   PFIZER(Purple Top)SARS-COV-2 Vaccination 06/24/2019, 07/19/2019, 03/12/2020   Pneumococcal Conjugate-13 09/09/2016   Pneumococcal-Unspecified 05/04/2014   Tdap 01/03/2009, 06/13/2019   Zoster Recombinat (Shingrix) 06/04/2019   Zoster, Live 03/23/2011    TDAP status: Up to date  Flu Vaccine status: Completed at today's visit  Pneumococcal vaccine status: Up to date  Covid-19 vaccine status: Completed vaccines  Qualifies for Shingles Vaccine? Yes   Zostavax completed  Completed   Shingrix Completed?: No.    Education has been provided regarding the importance of this vaccine. Patient has been advised to call insurance company to determine out of pocket expense if  they have not yet received this vaccine. Advised may also receive vaccine at local pharmacy or Health Dept. Verbalized acceptance and understanding.Patient plans to obtain second dose from pharmacy  Screening Tests Health Maintenance  Topic Date Due   Hepatitis C Screening  Never done   COLONOSCOPY (Pts 45-51yrs Insurance coverage will need to be confirmed)  Never done   Zoster Vaccines- Shingrix (2 of 2) 07/30/2019   COVID-19 Vaccine (4 - Booster for Pfizer series) 07/13/2020   OPHTHALMOLOGY EXAM  06/26/2021   HEMOGLOBIN A1C  08/04/2021   FOOT EXAM  02/04/2022   TETANUS/TDAP  06/12/2029   INFLUENZA VACCINE  Completed   HPV VACCINES  Aged Out    Health Maintenance  Health Maintenance Due  Topic Date Due   Hepatitis C Screening  Never done   COLONOSCOPY (Pts 45-38yrs Insurance coverage will need to be confirmed)  Never done   Zoster Vaccines- Shingrix (2 of 2) 07/30/2019   COVID-19 Vaccine (4 - Booster for Pfizer series) 07/13/2020    Colorectal cancer screening: Referral to GI placed pt declined. Pt aware the office will call re: appt.  Lung Cancer Screening: (Low Dose CT Chest recommended if Age 33-80 years, 30 pack-year currently smoking OR have quit w/in 15years.) does not qualify.   Lung Cancer Screening Referral: n/a  Additional Screening:  Hepatitis C Screening: does not qualify; Completed pt declined  Vision Screening: Recommended annual ophthalmology exams for early detection of glaucoma and other disorders of the eye. Is the patient up to date with their annual eye exam?  Yes  Who is the provider or what is the name of the office in which the patient attends annual eye exams? [facility] If pt is not established with a provider, would they like to be referred to a provider to establish care? No .   Dental Screening: Recommended annual dental exams for proper oral hygiene  Community Resource Referral / Chronic Care Management: CRR required this visit?  No    CCM required this visit?  No      Plan:  -Will obtain fasting labs this  week.  -Discussed A1c, has improved. Continue to follow up with Endocrinology.  -Continue current medication regimen. -Follow up in 4 months DM, HLD, HTN  I have personally reviewed and noted the following in the patient's chart:   Medical and social history Use of alcohol, tobacco or illicit drugs  Current medications and supplements including opioid prescriptions. Patient is not currently taking opioid prescriptions. Functional ability and status Nutritional status Physical activity Advanced directives List of other physicians Hospitalizations, surgeries, and ER visits in previous 12 months Vitals Screenings to include cognitive, depression, and falls Referrals and appointments  In addition, I have reviewed and discussed with patient certain preventive protocols, quality metrics, and best practice recommendations. A written personalized care plan for preventive services as well as general preventive health recommendations were provided to patient.     Mayer Masker, PA-C   02/04/2021

## 2021-02-05 ENCOUNTER — Other Ambulatory Visit: Payer: Self-pay

## 2021-02-05 DIAGNOSIS — E1169 Type 2 diabetes mellitus with other specified complication: Secondary | ICD-10-CM

## 2021-02-05 DIAGNOSIS — E1159 Type 2 diabetes mellitus with other circulatory complications: Secondary | ICD-10-CM

## 2021-02-05 DIAGNOSIS — Z0000 Encounter for general adult medical examination without abnormal findings: Secondary | ICD-10-CM

## 2021-02-05 DIAGNOSIS — E781 Pure hyperglyceridemia: Secondary | ICD-10-CM

## 2021-02-05 DIAGNOSIS — I152 Hypertension secondary to endocrine disorders: Secondary | ICD-10-CM

## 2021-02-06 ENCOUNTER — Other Ambulatory Visit: Payer: PPO

## 2021-02-06 ENCOUNTER — Other Ambulatory Visit: Payer: Self-pay

## 2021-02-06 DIAGNOSIS — E1159 Type 2 diabetes mellitus with other circulatory complications: Secondary | ICD-10-CM | POA: Diagnosis not present

## 2021-02-06 DIAGNOSIS — Z0000 Encounter for general adult medical examination without abnormal findings: Secondary | ICD-10-CM | POA: Diagnosis not present

## 2021-02-06 DIAGNOSIS — E1169 Type 2 diabetes mellitus with other specified complication: Secondary | ICD-10-CM | POA: Diagnosis not present

## 2021-02-06 DIAGNOSIS — I152 Hypertension secondary to endocrine disorders: Secondary | ICD-10-CM

## 2021-02-06 DIAGNOSIS — E782 Mixed hyperlipidemia: Secondary | ICD-10-CM | POA: Diagnosis not present

## 2021-02-06 DIAGNOSIS — E781 Pure hyperglyceridemia: Secondary | ICD-10-CM | POA: Diagnosis not present

## 2021-02-07 LAB — COMPREHENSIVE METABOLIC PANEL
ALT: 20 IU/L (ref 0–44)
AST: 14 IU/L (ref 0–40)
Albumin/Globulin Ratio: 1.5 (ref 1.2–2.2)
Albumin: 4.5 g/dL (ref 3.7–4.7)
Alkaline Phosphatase: 72 IU/L (ref 44–121)
BUN/Creatinine Ratio: 20 (ref 10–24)
BUN: 15 mg/dL (ref 8–27)
Bilirubin Total: 0.4 mg/dL (ref 0.0–1.2)
CO2: 22 mmol/L (ref 20–29)
Calcium: 9.8 mg/dL (ref 8.6–10.2)
Chloride: 97 mmol/L (ref 96–106)
Creatinine, Ser: 0.75 mg/dL — ABNORMAL LOW (ref 0.76–1.27)
Globulin, Total: 3 g/dL (ref 1.5–4.5)
Glucose: 103 mg/dL — ABNORMAL HIGH (ref 65–99)
Potassium: 5 mmol/L (ref 3.5–5.2)
Sodium: 135 mmol/L (ref 134–144)
Total Protein: 7.5 g/dL (ref 6.0–8.5)
eGFR: 96 mL/min/{1.73_m2} (ref >59)

## 2021-02-07 LAB — CBC WITH DIFFERENTIAL/PLATELET
Basophils Absolute: 0.1 10*3/uL (ref 0.0–0.2)
Basos: 1 %
EOS (ABSOLUTE): 0.6 10*3/uL — ABNORMAL HIGH (ref 0.0–0.4)
Eos: 7 %
Hematocrit: 51.3 % — ABNORMAL HIGH (ref 37.5–51.0)
Hemoglobin: 17.6 g/dL (ref 13.0–17.7)
Immature Grans (Abs): 0 10*3/uL (ref 0.0–0.1)
Immature Granulocytes: 0 %
Lymphocytes Absolute: 3.1 10*3/uL (ref 0.7–3.1)
Lymphs: 36 %
MCH: 31.3 pg (ref 26.6–33.0)
MCHC: 34.3 g/dL (ref 31.5–35.7)
MCV: 91 fL (ref 79–97)
Monocytes Absolute: 0.8 10*3/uL (ref 0.1–0.9)
Monocytes: 9 %
Neutrophils Absolute: 4 10*3/uL (ref 1.4–7.0)
Neutrophils: 47 %
Platelets: 330 10*3/uL (ref 150–450)
RBC: 5.62 x10E6/uL (ref 4.14–5.80)
RDW: 12.3 % (ref 11.6–15.4)
WBC: 8.6 10*3/uL (ref 3.4–10.8)

## 2021-02-07 LAB — LIPID PANEL
Chol/HDL Ratio: 3.3 ratio (ref 0.0–5.0)
Cholesterol, Total: 113 mg/dL (ref 100–199)
HDL: 34 mg/dL — ABNORMAL LOW (ref >39)
LDL Chol Calc (NIH): 61 mg/dL (ref 0–99)
Triglycerides: 90 mg/dL (ref 0–149)
VLDL Cholesterol Cal: 18 mg/dL (ref 5–40)

## 2021-02-07 LAB — TSH: TSH: 1.54 u[IU]/mL (ref 0.450–4.500)

## 2021-02-11 ENCOUNTER — Encounter: Payer: Self-pay | Admitting: Endocrinology

## 2021-02-11 ENCOUNTER — Ambulatory Visit (INDEPENDENT_AMBULATORY_CARE_PROVIDER_SITE_OTHER): Payer: PPO | Admitting: Endocrinology

## 2021-02-11 ENCOUNTER — Other Ambulatory Visit: Payer: Self-pay

## 2021-02-11 VITALS — BP 110/70 | HR 82 | Ht 70.5 in | Wt 206.2 lb

## 2021-02-11 DIAGNOSIS — E119 Type 2 diabetes mellitus without complications: Secondary | ICD-10-CM

## 2021-02-11 DIAGNOSIS — E1165 Type 2 diabetes mellitus with hyperglycemia: Secondary | ICD-10-CM

## 2021-02-11 DIAGNOSIS — E782 Mixed hyperlipidemia: Secondary | ICD-10-CM | POA: Diagnosis not present

## 2021-02-11 DIAGNOSIS — Z794 Long term (current) use of insulin: Secondary | ICD-10-CM | POA: Diagnosis not present

## 2021-02-11 MED ORDER — LISINOPRIL 5 MG PO TABS: 5.0000 mg | ORAL_TABLET | Freq: Every day | ORAL | 3 refills | Status: DC

## 2021-02-11 NOTE — Patient Instructions (Addendum)
Check blood sugars on waking up 3-4  days a week  Also check blood sugars about 2 hours after meals and do this after different meals by rotation  Recommended blood sugar levels on waking up are 90-130 and about 2 hours after meal is 130-160  Please bring your blood sugar monitor to each visit, thank you  Lisinopril 5mg  daily

## 2021-02-11 NOTE — Progress Notes (Signed)
patient ID: Jose Munoz, male   DOB: 07/15/1950, 70 y.o.   MRN: [id number]           Reason for Appointment:  Follow-up for Type 2 Diabetes   History of Present Illness:          Date of diagnosis of type 2 diabetes mellitus: 1998       Background history:   He thinks his diabetes was relatively easier to control and the onset even though he had initial symptoms of thirst and weight loss He had done very well with his diet and lost a significant amount of weight and was approved to control his diabetes without medications for a few years Subsequently he started with oral medications including metformin, Actos and Amaryl He thinks his blood sugars were well controlled for a few years initially but started getting higher progressively since 2016. Because of poor control he was started on basal bolus insulin regimen on his initial consultation, highest A1c was 11.9 in 01/2016  Recent history:    INSULIN regimen: TRESIBA 42 units daily at bedtime, Novolog 0-8 at lunch, 10 units at dinner  Non-insulin hypoglycemic drugs the patient is taking are:Invokamet 150/1000 twice a day  Current management, blood sugar patterns and problems identified: His A1c is 6.4 done by point-of-care testing at the PCP office, previously 7%  Appears to have lost weight around 5 pounds  He thinks his sugars are better because of adding cinnamon  As before he forgets to check his readings after meals and mostly checking in the morning  At times he will go out for lunch and frequently will not take his insulin with him He is playing golf and some times walking for exercise He does not feel hypoglycemic at any time and has had previous episodes and is aware of the symptoms  Generally fasting blood sugars are just over 100 with current dose of Tresiba  Lab glucose 103 Renal function stable with Invokana        Side effects from medications have been: None  Compliance with the medical regimen:  Fair  Glucose monitoring:  done about 1 times a day         Glucometer:   One Touch Verio  Blood sugars by download   PRE-MEAL Fasting Lunch Dinner Bedtime Overall  Glucose range: 98-130      Mean/median: 110 114 100  113   POST-MEAL PC Breakfast PC Lunch PC Dinner  Glucose range: 150  82  Mean/median:      Previously:  FASTING range 99-132, after breakfast 155 Average 120   Self-care: The diet that the patient has been following is: tries to limit sweets and drinks with sugar .      Typical meal intake: Breakfast is egg, toast occasionally, sometimes skipped or; Lunch is a sandwich, has various snacks    Dinner 5-7 P.m.            Dietician visit, most recent: 06/2016 CDE visit: 11/17              Weight history:  Wt Readings from Last 3 Encounters:  02/11/21 206 lb 3.2 oz (93.5 kg)  02/04/21 205 lb (93 kg)  10/10/20 210 lb 6 oz (95.4 kg)    Glycemic control:   Lab Results  Component Value Date   HGBA1C 6.4 (A) 02/04/2021   HGBA1C 7.0 (A) 09/24/2020   HGBA1C 6.9 (H) 07/02/2020   Lab Results  Component Value Date   MICROALBUR 80 09/24/2020  LDLCALC 61 02/06/2021   CREATININE 0.75 (L) 02/06/2021   Lab Results  Component Value Date   MICRALBCREAT 30.300 09/24/2020       Allergies as of 02/11/2021   No Known Allergies      Medication List        Accurate as of February 11, 2021  8:39 AM. If you have any questions, ask your nurse or doctor.          aspirin 81 MG tablet Take 81 mg by mouth daily.   BD Pen Needle Nano 2nd Gen 32G X 4 MM Misc Generic drug: Insulin Pen Needle USE TWICE DAILY   Insulin Aspart FlexPen 100 UNIT/ML Commonly known as: NOVOLOG INJECY 8-10 UNITS UNDER THE SKIN BEFORE MAIN MEAL DAILY   Invokamet 435 291 4684 MG Tabs Generic drug: Canagliflozin-metFORMIN HCl TAKE 2 TABLETS BY MOUTH DAILY   lisinopril 10 MG tablet Commonly known as: ZESTRIL TAKE 1 TABLET(10 MG) BY MOUTH DAILY   onetouch ultrasoft lancets Use to  test blood sugar 2 times daily- Dx code E11.9   OneTouch Verio test strip Generic drug: glucose blood TEST TWICE DAILY DX CODE E11.9   simvastatin 40 MG tablet Commonly known as: ZOCOR TAKE 1 TABLET(40 MG) BY MOUTH DAILY AT 6 PM   Tresiba FlexTouch 200 UNIT/ML FlexTouch Pen Generic drug: insulin degludec ADMINISTER 40 UNITS UNDER THE SKIN DAILY What changed: See the new instructions.   Vitamin D (Ergocalciferol) 1.25 MG (50000 UNIT) Caps capsule Commonly known as: DRISDOL Take 1 capsule (50,000 Units total) by mouth every 7 (seven) days. What changed: when to take this        Allergies: No Known Allergies  Past Medical History:  Diagnosis Date   Diabetes mellitus without complication ([place])    Essential hypertension 10/16/2015   History of oral surgery    Hyperlipidemia    OA (osteoarthritis) of hip 11/05/2015   Obese 10/16/2015   Smoking    Vitamin D deficiency 11/05/2015    Past Surgical History:  Procedure Laterality Date   CATARACT EXTRACTION W/PHACO Left 08/12/2020   Procedure: CATARACT EXTRACTION PHACO AND INTRAOCULAR LENS PLACEMENT ([place]) LEFT DIABETIC [place];  Surgeon: Eulogio Bear, MD;  Location: [facility];  Service: Ophthalmology;  Laterality: Left;  3.75 0:30.1   CATARACT EXTRACTION W/PHACO Right 08/26/2020   Procedure: CATARACT EXTRACTION PHACO AND INTRAOCULAR LENS PLACEMENT ([facility]) RIGHT DIABETIC [place]  LENS 2.82 00:26.1;  Surgeon: Eulogio Bear, MD;  Location: [facility];  Service: Ophthalmology;  Laterality: Right;  Diabetic    Family History  Problem Relation Age of Onset   Diabetes Mother    Congestive Heart Failure Mother    Cancer Sister        breast   Cancer Sister        cervical   Heart attack Brother     Social History:  reports that he has been smoking cigarettes. He has a 25.00 pack-year smoking history. He has never used smokeless tobacco. He reports that he does not currently use alcohol. He  reports that he does not use drugs.   Review of Systems   Lipid history: Has excellent LDL levels with taking simvastatin 40 mg prescribed by his PCP HDL low but triglycerides are normal    Lab Results  Component Value Date   CHOL 113 02/06/2021   HDL 34 (L) 02/06/2021   LDLCALC 61 02/06/2021   TRIG 90 02/06/2021   CHOLHDL 3.3 02/06/2021  Hypertension:  is being treated with lisinopril 64m prescribed by PCP Also on Invokana  Blood pressure being checked at home also: He says his readings are in the 1258Nsystolic No lightheadedness  BP Readings from Last 3 Encounters:  02/11/21 110/70  02/04/21 109/69  10/10/20 122/78   Potassium is upper normal again   Lab Results  Component Value Date   K 5.0 02/06/2021    Has eye exams regularly, was told to have mild background retinopathy   Most recent foot exam: 8/21  Taking Vitmain D from his PCP and was advised to take it only once a month   LABS:  Lab on 02/06/2021  Component Date Value Ref Range Status   TSH 02/06/2021 1.540  0.450 - 4.500 uIU/mL Final   Cholesterol, Total 02/06/2021 113  100 - 199 mg/dL Final   Triglycerides 02/06/2021 90  0 - 149 mg/dL Final   HDL 02/06/2021 34 (A) >39 mg/dL Final   VLDL Cholesterol Cal 02/06/2021 18  5 - 40 mg/dL Final   LDL Chol Calc (NIH) 02/06/2021 61  0 - 99 mg/dL Final   Chol/HDL Ratio 02/06/2021 3.3  0.0 - 5.0 ratio Final   Comment:                                   T. Chol/HDL Ratio                                             Men  Women                               1/2 Avg.Risk  3.4    3.3                                   Avg.Risk  5.0    4.4                                2X Avg.Risk  9.6    7.1                                3X Avg.Risk 23.4   11.0    WBC 02/06/2021 8.6  3.4 - 10.8 x10E3/uL Final   RBC 02/06/2021 5.62  4.14 - 5.80 x10E6/uL Final   Hemoglobin 02/06/2021 17.6  13.0 - 17.7 g/dL Final   Hematocrit 02/06/2021 51.3 (A) 37.5 - 51.0 % Final    MCV 02/06/2021 91  79 - 97 fL Final   MCH 02/06/2021 31.3  26.6 - 33.0 pg Final   MCHC 02/06/2021 34.3  31.5 - 35.7 g/dL Final   RDW 02/06/2021 12.3  11.6 - 15.4 % Final   Platelets 02/06/2021 330  150 - 450 x10E3/uL Final   Neutrophils 02/06/2021 47  Not Estab. % Final   Lymphs 02/06/2021 36  Not Estab. % Final   Monocytes 02/06/2021 9  Not Estab. % Final   Eos 02/06/2021 7  Not Estab. % Final   Basos 02/06/2021 1  Not Estab. % Final   Neutrophils Absolute  02/06/2021 4.0  1.4 - 7.0 x10E3/uL Final   Lymphocytes Absolute 02/06/2021 3.1  0.7 - 3.1 x10E3/uL Final   Monocytes Absolute 02/06/2021 0.8  0.1 - 0.9 x10E3/uL Final   EOS (ABSOLUTE) 02/06/2021 0.6 (A) 0.0 - 0.4 x10E3/uL Final   Basophils Absolute 02/06/2021 0.1  0.0 - 0.2 x10E3/uL Final   Immature Granulocytes 02/06/2021 0  Not Estab. % Final   Immature Grans (Abs) 02/06/2021 0.0  0.0 - 0.1 x10E3/uL Final   Glucose 02/06/2021 103 (A) 65 - 99 mg/dL Final   Comment:                **Effective February 10, 2021 Glucose reference**                  interval will be changing to:                                                             70 - 99    BUN 02/06/2021 15  8 - 27 mg/dL Final   Creatinine, Ser 02/06/2021 0.75 (A) 0.76 - 1.27 mg/dL Final   eGFR 02/06/2021 96  >59 mL/min/1.73 Final   BUN/Creatinine Ratio 02/06/2021 20  10 - 24 Final   Sodium 02/06/2021 135  134 - 144 mmol/L Final   Potassium 02/06/2021 5.0  3.5 - 5.2 mmol/L Final   Chloride 02/06/2021 97  96 - 106 mmol/L Final   CO2 02/06/2021 22  20 - 29 mmol/L Final   Calcium 02/06/2021 9.8  8.6 - 10.2 mg/dL Final   Total Protein 02/06/2021 7.5  6.0 - 8.5 g/dL Final   Albumin 02/06/2021 4.5  3.7 - 4.7 g/dL Final   Globulin, Total 02/06/2021 3.0  1.5 - 4.5 g/dL Final   Albumin/Globulin Ratio 02/06/2021 1.5  1.2 - 2.2 Final   Bilirubin Total 02/06/2021 0.4  0.0 - 1.2 mg/dL Final   Alkaline Phosphatase 02/06/2021 72  44 - 121 IU/L Final   AST 02/06/2021 14  0 - 40 IU/L  Final   ALT 02/06/2021 20  0 - 44 IU/L Final    Physical Examination:  BP 110/70 (BP Location: Left Arm, Patient Position: Sitting, Cuff Size: Normal)   Pulse 82   Ht 5' 10.5" (1.791 m)   Wt 206 lb 3.2 oz (93.5 kg)   SpO2 98%   BMI 29.17 kg/m      ASSESSMENT:  Diabetes type 2, insulin-dependent  See history of present illness for detailed discussion of current diabetes management, blood sugar patterns and problems identified  His A1c is recently 6.4 down by PCP, previously was at 7%  He is on Invokamet and basal bolus insulin   Not sure if he is having significantly high postprandial readings especially when he misses the lunchtime dose of his NovoLog because of lack of monitoring He feels that his sugars are better with [place] Fasting readings are consistently fairly good near 100 without hypoglycemia also  Weight is coming down with consistent exercise and diet  MICROALBUMINURIA: Needs follow-up   Hypertension: On lisinopril 10 mg, blood pressure being low normal along with high normal potassium Also not clear if his home monitor is reading higher than actual reading   LIPIDS: Well-controlled  PLAN:   He will need to start checking blood sugars regularly after  meals and not just in the morning  He will need to make sure he takes his NovoLog with him when he is eating out at lunch  Again discussed adjusting the mealtime dose 2 to 4 units based on meal size up or down  No change in [place] dosage  Consider freestyle [place] if he has difficulty with consistent monitoring or if blood sugars are going higher Continue Invokamet  Will reduce lisinopril to 5 mg as blood pressure is low normal and his potassium is high normal However if he has increasing microalbuminuria will need to switch to losartan  Patient Instructions  Check blood sugars on waking up 3-4  days a week  Also check blood sugars about 2 hours after meals and do this after different meals by  rotation  Recommended blood sugar levels on waking up are 90-130 and about 2 hours after meal is 130-160  Please bring your blood sugar monitor to each visit, thank you     Elayne Snare 02/11/2021, 8:39 AM   Note: This office note was prepared with Dragon voice recognition system technology. Any transcriptional errors that result from this process are unintentional.

## 2021-03-20 ENCOUNTER — Other Ambulatory Visit: Payer: Self-pay | Admitting: Physician Assistant

## 2021-03-20 DIAGNOSIS — I152 Hypertension secondary to endocrine disorders: Secondary | ICD-10-CM

## 2021-03-20 DIAGNOSIS — E1159 Type 2 diabetes mellitus with other circulatory complications: Secondary | ICD-10-CM

## 2021-03-20 DIAGNOSIS — E559 Vitamin D deficiency, unspecified: Secondary | ICD-10-CM

## 2021-03-20 DIAGNOSIS — E1169 Type 2 diabetes mellitus with other specified complication: Secondary | ICD-10-CM

## 2021-04-05 ENCOUNTER — Other Ambulatory Visit: Payer: Self-pay | Admitting: Endocrinology

## 2021-05-08 ENCOUNTER — Other Ambulatory Visit: Payer: Self-pay

## 2021-05-08 DIAGNOSIS — Z794 Long term (current) use of insulin: Secondary | ICD-10-CM

## 2021-05-08 MED ORDER — INSULIN ASPART FLEXPEN 100 UNIT/ML ~~LOC~~ SOPN: PEN_INJECTOR | SUBCUTANEOUS | 2 refills | Status: DC

## 2021-05-19 ENCOUNTER — Other Ambulatory Visit: Payer: Self-pay | Admitting: Endocrinology

## 2021-05-19 DIAGNOSIS — Z794 Long term (current) use of insulin: Secondary | ICD-10-CM

## 2021-05-19 DIAGNOSIS — E1165 Type 2 diabetes mellitus with hyperglycemia: Secondary | ICD-10-CM

## 2021-05-20 ENCOUNTER — Other Ambulatory Visit: Payer: Self-pay | Admitting: Endocrinology

## 2021-05-20 DIAGNOSIS — E1165 Type 2 diabetes mellitus with hyperglycemia: Secondary | ICD-10-CM

## 2021-06-02 ENCOUNTER — Other Ambulatory Visit: Payer: Self-pay

## 2021-06-02 ENCOUNTER — Ambulatory Visit (INDEPENDENT_AMBULATORY_CARE_PROVIDER_SITE_OTHER): Payer: HMO | Admitting: Physician Assistant

## 2021-06-02 ENCOUNTER — Encounter: Payer: Self-pay | Admitting: Physician Assistant

## 2021-06-02 VITALS — BP 120/84 | HR 90 | Temp 97.6°F | Ht 71.0 in | Wt 207.0 lb

## 2021-06-02 DIAGNOSIS — H6191 Disorder of right external ear, unspecified: Secondary | ICD-10-CM | POA: Diagnosis not present

## 2021-06-02 DIAGNOSIS — H9202 Otalgia, left ear: Secondary | ICD-10-CM

## 2021-06-02 DIAGNOSIS — E119 Type 2 diabetes mellitus without complications: Secondary | ICD-10-CM | POA: Diagnosis not present

## 2021-06-02 DIAGNOSIS — H6121 Impacted cerumen, right ear: Secondary | ICD-10-CM | POA: Diagnosis not present

## 2021-06-02 NOTE — Progress Notes (Signed)
Acute Office Visit  Subjective:    Patient ID: Jose Munoz, male    DOB: March 02, 1951, 71 y.o.   MRN: [id number]  Chief Complaint  Patient presents with   Acute Visit    Ear Ache     HPI Patient is in today for c/o intermittent left ear pain for a couple of months. Reports suddenly gets a throbbing pain which lasts only a couple of seconds. Rates pain 5/10 when it occurs. No fever, otorrhea, runny nose, congestion, cough or decreased hearing.   Past Medical History:  Diagnosis Date   Diabetes mellitus without complication ([place])    Essential hypertension 10/16/2015   History of oral surgery    Hyperlipidemia    OA (osteoarthritis) of hip 11/05/2015   Obese 10/16/2015   Smoking    Vitamin D deficiency 11/05/2015    Past Surgical History:  Procedure Laterality Date   CATARACT EXTRACTION W/PHACO Left 08/12/2020   Procedure: CATARACT EXTRACTION PHACO AND INTRAOCULAR LENS PLACEMENT ([place]) LEFT DIABETIC [place];  Surgeon: Eulogio Bear, MD;  Location: [facility];  Service: Ophthalmology;  Laterality: Left;  3.75 0:30.1   CATARACT EXTRACTION W/PHACO Right 08/26/2020   Procedure: CATARACT EXTRACTION PHACO AND INTRAOCULAR LENS PLACEMENT ([facility]) RIGHT DIABETIC PANOPTIX  LENS 2.82 00:26.1;  Surgeon: Eulogio Bear, MD;  Location: [facility];  Service: Ophthalmology;  Laterality: Right;  Diabetic    Family History  Problem Relation Age of Onset   Diabetes Mother    Congestive Heart Failure Mother    Cancer Sister        breast   Cancer Sister        cervical   Heart attack Brother     Social History   Socioeconomic History   Marital status: Widowed    Spouse name: Not on file   Number of children: Not on file   Years of education: Not on file   Highest education level: Not on file  Occupational History   Not on file  Tobacco Use   Smoking status: Every Day    Packs/day: 1.00    Years: 25.00    Pack years: 25.00    Types: Cigarettes    Smokeless tobacco: Never  Vaping Use   Vaping Use: Never used  Substance and Sexual Activity   Alcohol use: Not Currently    Alcohol/week: 0.0 standard drinks   Drug use: No   Sexual activity: Never  Other Topics Concern   Not on file  Social History Narrative   Not on file   Social Determinants of Health   Financial Resource Strain: Not on file  Food Insecurity: Not on file  Transportation Needs: Not on file  Physical Activity: Not on file  Stress: Not on file  Social Connections: Not on file  Intimate Partner Violence: Not on file    Outpatient Medications Prior to Visit  Medication Sig Dispense Refill   aspirin 81 MG tablet Take 81 mg by mouth daily.     BD PEN NEEDLE NANO 2ND GEN 32G X 4 MM MISC USE TWICE DAILY 100 each 4   glucose blood (ONETOUCH VERIO) test strip TEST TWICE DAILY DX CODE E11.9 100 each 5   Insulin Aspart FlexPen (NOVOLOG) 100 UNIT/ML INJECY 8-10 UNITS UNDER THE SKIN BEFORE MAIN MEAL DAILY 15 mL 2   INVOKAMET 726 700 6522 MG TABS TAKE 2 TABLETS BY MOUTH DAILY 180 tablet 1   Lancets (ONETOUCH ULTRASOFT) lancets Use to test blood sugar 2  times daily- Dx code E11.9 100 each 12   lisinopril (ZESTRIL) 5 MG tablet Take 1 tablet (5 mg total) by mouth daily. 90 tablet 3   simvastatin (ZOCOR) 40 MG tablet TAKE 1 TABLET(40 MG) BY MOUTH DAILY AT 6 PM 90 tablet 0   TRESIBA FLEXTOUCH 200 UNIT/ML FlexTouch Pen ADMINISTER 40 UNITS UNDER THE SKIN DAILY 15 mL 2   Vitamin D, Ergocalciferol, (DRISDOL) 1.25 MG (50000 UNIT) CAPS capsule TAKE 1 CAPSULE BY MOUTH EVERY 7 DAYS 12 capsule 1   No facility-administered medications prior to visit.    No Known Allergies  Review of Systems Review of Systems:  A fourteen system review of systems was performed and found to be positive as per HPI.    Objective:    Physical Exam General:  Well Developed, well nourished, appropriate for stated age.  Neuro:  Alert and oriented,  extra-ocular muscles intact  HEENT:  Normocephalic,  atraumatic, normal TM and external canal of left ear, excessive cerumen of right ear, negative Tragus bilaterally,  neck supple Skin:  round and scaly growth of right ear at helix. Cardiac:  RRR, S1 S2 Respiratory: CTA, Not using accessory muscles, speaking in full sentences- unlabored. Vascular:  Ext warm, no cyanosis apprec.; cap RF less 2 sec. Psych:  No HI/SI, judgement and insight good, Euthymic mood. Full Affect.  BP 120/84    Pulse 90    Temp 97.6 F (36.4 C)    Ht 5' 11"  (1.803 m)    Wt 207 lb (93.9 kg)    SpO2 97%    BMI 28.87 kg/m  Wt Readings from Last 3 Encounters:  06/02/21 207 lb (93.9 kg)  02/11/21 206 lb 3.2 oz (93.5 kg)  02/04/21 205 lb (93 kg)    Health Maintenance Due  Topic Date Due   Hepatitis C Screening  Never done   COLONOSCOPY (Pts 45-15yr Insurance coverage will need to be confirmed)  Never done   Pneumonia Vaccine 73 Years old (2 - PPSV23 if available, else PCV20) 09/09/2017   Zoster Vaccines- Shingrix (2 of 2) 07/30/2019   COVID-19 Vaccine (4 - Booster for Pfizer series) 05/07/2020    There are no preventive care reminders to display for this patient.   Lab Results  Component Value Date   TSH 1.540 02/06/2021   Lab Results  Component Value Date   WBC 8.6 02/06/2021   HGB 17.6 02/06/2021   HCT 51.3 (H) 02/06/2021   MCV 91 02/06/2021   PLT 330 02/06/2021   Lab Results  Component Value Date   NA 135 02/06/2021   K 5.0 02/06/2021   CO2 22 02/06/2021   GLUCOSE 103 (H) 02/06/2021   BUN 15 02/06/2021   CREATININE 0.75 (L) 02/06/2021   BILITOT 0.4 02/06/2021   ALKPHOS 72 02/06/2021   AST 14 02/06/2021   ALT 20 02/06/2021   PROT 7.5 02/06/2021   ALBUMIN 4.5 02/06/2021   CALCIUM 9.8 02/06/2021   EGFR 96 02/06/2021   GFR 113.09 12/02/2017   Lab Results  Component Value Date   CHOL 113 02/06/2021   Lab Results  Component Value Date   HDL 34 (L) 02/06/2021   Lab Results  Component Value Date   LDLCALC 61 02/06/2021   Lab Results   Component Value Date   TRIG 90 02/06/2021   Lab Results  Component Value Date   CHOLHDL 3.3 02/06/2021   Lab Results  Component Value Date   HGBA1C 6.4 (A) 02/04/2021  Assessment & Plan:   Problem List Items Addressed This Visit   None Visit Diagnoses     Earache on left    -  Primary   Skin lesion of right ear       Relevant Orders   Ambulatory referral to Dermatology   Impacted cerumen of right ear          Left earache: -Reassurance provided no signs of ear infection present on exam. Exam unremarkable. If symptoms worsen, become frequent or develops new symptoms such as fever then recommend re-evaluation and/or referral to ENT.  Skin lesion of right ear: -Will place referral to dermatology for further evaluation.   Indication: Cerumen impaction of the right ear(s) Medical necessity statement:  On physical examination, cerumen impairs clinically significant portions of the external auditory canal, and tympanic membrane.  Consent:  Discussed benefits and risks of procedure and verbal consent obtained Procedure:   Patient was prepped for the procedure.  Utilized an otoscope to assess and take note of the ear canal, the tympanic membrane, and the presence, amount, and placement of the cerumen. Gentle water irrigation and soft plastic curette was utilized to remove cerumen. Post procedure examination:  shows cerumen was removed, without trauma or injury to the ear canal or TM, which remains intact.   Post-Procedural Ear Care Instructions:    Patient tolerated procedure well.  Proper ear care d/c pt.   The patient is made aware that they may experience temporary vertigo, temporary hearing loss, and temporary discomfort.  If these symptom last for more than 24 hours to call the clinic or proceed to the ED/Urgent Care.  Of note, patient wants to get labs ordered by endocrinology, however, is not fasting so will not be able to collect lipid panel. Pt is aware and verbalized  understanding.  No orders of the defined types were placed in this encounter.    Lorrene Reid, PA-C

## 2021-06-02 NOTE — Patient Instructions (Signed)
Earache, Adult An earache, or ear pain, can be caused by many things, including: An infection. Ear wax buildup. Ear pressure. Something in the ear that should not be there (foreign body). A sore throat. Tooth problems. Jaw problems. Treatment of the earache will depend on the cause. If the cause is not clear or cannot be determined, you may need to watch your symptoms until your earache goes away or until a cause is found. Follow these instructions at home: Medicines Take or apply over-the-counter and prescription medicines only as told by your health care provider. If you were prescribed an antibiotic medicine, use it as told by your health care provider. Do not stop using the antibiotic even if you start to feel better. Do not put anything in your ear other than medicine that is prescribed by your health care provider. Managing pain If directed, apply heat to the affected area as often as told by your health care provider. Use the heat source that your health care provider recommends, such as a moist heat pack or a heating pad. Place a towel between your skin and the heat source. Leave the heat on for 20-30 minutes. Remove the heat if your skin turns bright red. This is especially important if you are unable to feel pain, heat, or cold. You may have a greater risk of getting burned. If directed, put ice on the affected area as often as told by your health care provider. To do this:   Put ice in a plastic bag. Place a towel between your skin and the bag. Leave the ice on for 20 minutes, 2-3 times a day. General instructions Pay attention to any changes in your symptoms. Try resting in an upright position instead of lying down. This may help to reduce pressure in your ear and relieve pain. Chew gum if it helps to relieve your ear pain. Treat any allergies as told by your health care provider. Drink enough fluid to keep your urine pale yellow. It is up to you to get the results of any  tests that were done. Ask your health care provider, or the department that is doing the tests, when your results will be ready. Keep all follow-up visits as told by your health care provider. This is important. Contact a health care provider if: Your pain does not improve within 2 days. Your earache gets worse. You have new symptoms. You have a fever. Get help right away if you: Have a severe headache. Have a stiff neck. Have trouble swallowing. Have redness or swelling behind your ear. Have fluid or blood coming from your ear. Have hearing loss. Feel dizzy. Summary An earache, or ear pain, can be caused by many things. Treatment of the earache will depend on the cause. Follow recommendations from your health care provider to treat your ear pain. If the cause is not clear or cannot be determined, you may need to watch your symptoms until your earache goes away or until a cause is found. Keep all follow-up visits as told by your health care provider. This is important. This information is not intended to replace advice given to you by your health care provider. Make sure you discuss any questions you have with your health care provider. Document Revised: 12/10/2018 Document Reviewed: 12/10/2018 Elsevier Patient Education  2022 [employer]  

## 2021-06-05 LAB — BASIC METABOLIC PANEL
BUN/Creatinine Ratio: 16 (ref 10–24)
BUN: 16 mg/dL (ref 8–27)
CO2: 16 mmol/L — ABNORMAL LOW (ref 20–29)
Calcium: 10.5 mg/dL — ABNORMAL HIGH (ref 8.6–10.2)
Chloride: 104 mmol/L (ref 96–106)
Creatinine, Ser: 0.97 mg/dL (ref 0.76–1.27)
Glucose: 174 mg/dL — ABNORMAL HIGH (ref 70–99)
Potassium: 5.4 mmol/L — ABNORMAL HIGH (ref 3.5–5.2)
Sodium: 145 mmol/L — ABNORMAL HIGH (ref 134–144)
eGFR: 83 mL/min/{1.73_m2} (ref >59)

## 2021-06-05 LAB — MICROALBUMIN / CREATININE URINE RATIO
Creatinine, Urine: 65.3 mg/dL
Microalb/Creat Ratio: 217 mg/g creat — ABNORMAL HIGH (ref 0–29)
Microalbumin, Urine: 141.4 ug/mL

## 2021-06-05 LAB — HEMOGLOBIN A1C
Est. average glucose Bld gHb Est-mCnc: 154 mg/dL
Hgb A1c MFr Bld: 7 % — ABNORMAL HIGH (ref 4.8–5.6)

## 2021-06-11 ENCOUNTER — Other Ambulatory Visit: Payer: Self-pay

## 2021-06-11 DIAGNOSIS — E875 Hyperkalemia: Secondary | ICD-10-CM

## 2021-06-12 ENCOUNTER — Other Ambulatory Visit: Payer: HMO

## 2021-06-12 ENCOUNTER — Other Ambulatory Visit: Payer: Self-pay

## 2021-06-12 DIAGNOSIS — E875 Hyperkalemia: Secondary | ICD-10-CM

## 2021-06-13 LAB — COMPREHENSIVE METABOLIC PANEL
ALT: 21 IU/L (ref 0–44)
AST: 15 IU/L (ref 0–40)
Albumin/Globulin Ratio: 1.6 (ref 1.2–2.2)
Albumin: 4.4 g/dL (ref 3.7–4.7)
Alkaline Phosphatase: 73 IU/L (ref 44–121)
BUN/Creatinine Ratio: 19 (ref 10–24)
BUN: 16 mg/dL (ref 8–27)
Bilirubin Total: 0.2 mg/dL (ref 0.0–1.2)
CO2: 23 mmol/L (ref 20–29)
Calcium: 9.2 mg/dL (ref 8.6–10.2)
Chloride: 105 mmol/L (ref 96–106)
Creatinine, Ser: 0.86 mg/dL (ref 0.76–1.27)
Globulin, Total: 2.8 g/dL (ref 1.5–4.5)
Glucose: 130 mg/dL — ABNORMAL HIGH (ref 70–99)
Potassium: 4.6 mmol/L (ref 3.5–5.2)
Sodium: 146 mmol/L — ABNORMAL HIGH (ref 134–144)
Total Protein: 7.2 g/dL (ref 6.0–8.5)
eGFR: 92 mL/min/{1.73_m2} (ref >59)

## 2021-06-15 ENCOUNTER — Other Ambulatory Visit: Payer: Self-pay | Admitting: Endocrinology

## 2021-06-16 NOTE — Progress Notes (Signed)
patient ID: Jose Munoz, male   DOB: [birth year], 71 y.o.   MRN: [id number]           Reason for Appointment:  Follow-up for Type 2 Diabetes   History of Present Illness:          Date of diagnosis of type 2 diabetes mellitus: 1998       Background history:   He thinks his diabetes was relatively easier to control and the onset even though he had initial symptoms of thirst and weight loss He had done very well with his diet and lost a significant amount of weight and was approved to control his diabetes without medications for a few years Subsequently he started with oral medications including metformin, Actos and Amaryl He thinks his blood sugars were well controlled for a few years initially but started getting higher progressively since 2016. Because of poor control he was started on basal bolus insulin regimen on his initial consultation, highest A1c was 11.9 in 01/2016  Recent history:    INSULIN regimen: TRESIBA 42 units daily at bedtime, Novolog 0-8 at lunch, 10 units at dinner  Non-insulin hypoglycemic drugs the patient is taking are:Invokamet 150/1000 twice a day  Current management, blood sugar patterns and problems identified: His A1c is 7%, previously was 6.4% done by fingerstick with his PCP He thinks his sugars are higher because of going off his diet over the holidays  Also is not playing golf and doing as much exercise Weight has gradually gone up  Despite reminders he is not willing to check his sugars after meals Also he thinks that he has repeated error messages while checking his sugars He now says that he does not take NovoLog at lunch regularly because of the cost  Once or twice a week he will have a large breakfast with grits and toast but not taking insulin for this Renal function stable with insulin, which he is taking regularly        Side effects from medications have been: None  Compliance with the medical regimen: Fair  Glucose monitoring:  done  about 1 times a day         Glucometer:   One Touch Verio  Blood sugars by download   PRE-MEAL Fasting Lunch Dinner Bedtime Overall  Glucose range: 88-155 88, 111     Mean/median: 121    120   POST-MEAL PC Breakfast PC Lunch PC Dinner  Glucose range:   ?  Mean/median:      Prior  PRE-MEAL Fasting Lunch Dinner Bedtime Overall  Glucose range: 98-130      Mean/median: 110 114 100  113   POST-MEAL PC Breakfast PC Lunch PC Dinner  Glucose range: 150  82  Mean/median:      Self-care: The diet that the patient has been following is: tries to limit sweets and drinks with sugar .      Typical meal intake: Breakfast is egg, toast occasionally, sometimes skipped or; Lunch is a sandwich, has various snacks    Dinner 5-7 P.m.            Dietician visit, most recent: 06/2016 CDE visit: 11/17              Weight history:  Wt Readings from Last 3 Encounters:  06/17/21 209 lb 3.2 oz (94.9 kg)  06/02/21 207 lb (93.9 kg)  02/11/21 206 lb 3.2 oz (93.5 kg)    Glycemic control:   Lab Results  Component Value Date  HGBA1C 7.0 (H) 06/02/2021   HGBA1C 6.4 (A) 02/04/2021   HGBA1C 7.0 (A) 09/24/2020   Lab Results  Component Value Date   MICROALBUR 80 09/24/2020   LDLCALC 61 02/06/2021   CREATININE 0.86 06/12/2021   Lab Results  Component Value Date   MICRALBCREAT 217 (H) 06/02/2021       Allergies as of 06/17/2021   No Known Allergies      Medication List        Accurate as of June 17, 2021  8:16 AM. If you have any questions, ask your nurse or doctor.          STOP taking these medications    Invokamet [phone] MG Tabs Generic drug: Canagliflozin-metFORMIN HCl Stopped by: Elayne Snare, MD       TAKE these medications    aspirin 81 MG tablet Take 81 mg by mouth daily.   BD Pen Needle Nano 2nd Gen 32G X 4 MM Misc Generic drug: Insulin Pen Needle USE TWICE DAILY   Insulin Aspart FlexPen 100 UNIT/ML Commonly known as: NOVOLOG INJECY 8-10 UNITS UNDER  THE SKIN BEFORE MAIN MEAL DAILY   lisinopril 5 MG tablet Commonly known as: ZESTRIL Take 1 tablet (5 mg total) by mouth daily.   onetouch ultrasoft lancets Use to test blood sugar 2 times daily- Dx code E11.9   OneTouch Verio test strip Generic drug: glucose blood TEST TWICE DAILY DX CODE E11.9   simvastatin 40 MG tablet Commonly known as: ZOCOR TAKE 1 TABLET(40 MG) BY MOUTH DAILY AT 6 PM   Tresiba FlexTouch 200 UNIT/ML FlexTouch Pen Generic drug: insulin degludec ADMINISTER 40 UNITS UNDER THE SKIN DAILY What changed: See the new instructions.   Vitamin D (Ergocalciferol) 1.25 MG (50000 UNIT) Caps capsule Commonly known as: DRISDOL TAKE 1 CAPSULE BY MOUTH EVERY 7 DAYS        Allergies: No Known Allergies  Past Medical History:  Diagnosis Date   Diabetes mellitus without complication ([place])    Essential hypertension 10/16/2015   History of oral surgery    Hyperlipidemia    OA (osteoarthritis) of hip 11/05/2015   Obese 10/16/2015   Smoking    Vitamin D deficiency 11/05/2015    Past Surgical History:  Procedure Laterality Date   CATARACT EXTRACTION W/PHACO Left 08/12/2020   Procedure: CATARACT EXTRACTION PHACO AND INTRAOCULAR LENS PLACEMENT ([place]) LEFT DIABETIC [place];  Surgeon: Eulogio Bear, MD;  Location: [facility];  Service: Ophthalmology;  Laterality: Left;  3.75 0:30.1   CATARACT EXTRACTION W/PHACO Right 08/26/2020   Procedure: CATARACT EXTRACTION PHACO AND INTRAOCULAR LENS PLACEMENT ([facility]) RIGHT DIABETIC PANOPTIX  LENS 2.82 00:26.1;  Surgeon: Eulogio Bear, MD;  Location: [facility];  Service: Ophthalmology;  Laterality: Right;  Diabetic    Family History  Problem Relation Age of Onset   Diabetes Mother    Congestive Heart Failure Mother    Cancer Sister        breast   Cancer Sister        cervical   Heart attack Brother     Social History:  reports that he has been smoking cigarettes. He has a 25.00 pack-year  smoking history. He has never used smokeless tobacco. He reports that he does not currently use alcohol. He reports that he does not use drugs.   Review of Systems   Lipid history: Has excellent LDL levels with taking simvastatin 40 mg prescribed by his PCP HDL low but triglycerides are normal  Lab Results  Component Value Date   CHOL 113 02/06/2021   HDL 34 (L) 02/06/2021   LDLCALC 61 02/06/2021   TRIG 90 02/06/2021   CHOLHDL 3.3 02/06/2021           Hypertension:  is being treated with lisinopril 29m prescribed by PCP Also on Invokana  Blood pressure being checked at home also: He says his readings are in the 1940Hsystolic No lightheadedness  BP Readings from Last 3 Encounters:  06/17/21 122/62  06/02/21 120/84  02/11/21 110/70   Potassium is upper normal again   Lab Results  Component Value Date   K 4.6 06/12/2021    Has eye exams regularly, was told to have mild background retinopathy  Most recent foot exam: 8/21  Taking Vitmain D from his PCP and was advised to take it only once a month   LABS:  Lab on 06/12/2021  Component Date Value Ref Range Status   Glucose 06/12/2021 130 (H)  70 - 99 mg/dL Final   BUN 06/12/2021 16  8 - 27 mg/dL Final   Creatinine, Ser 06/12/2021 0.86  0.76 - 1.27 mg/dL Final   eGFR 06/12/2021 92  >59 mL/min/1.73 Final   BUN/Creatinine Ratio 06/12/2021 19  10 - 24 Final   Sodium 06/12/2021 146 (H)  134 - 144 mmol/L Final   Potassium 06/12/2021 4.6  3.5 - 5.2 mmol/L Final   Chloride 06/12/2021 105  96 - 106 mmol/L Final   CO2 06/12/2021 23  20 - 29 mmol/L Final   Calcium 06/12/2021 9.2  8.6 - 10.2 mg/dL Final   Total Protein 06/12/2021 7.2  6.0 - 8.5 g/dL Final   Albumin 06/12/2021 4.4  3.7 - 4.7 g/dL Final   Globulin, Total 06/12/2021 2.8  1.5 - 4.5 g/dL Final   Albumin/Globulin Ratio 06/12/2021 1.6  1.2 - 2.2 Final   Bilirubin Total 06/12/2021 0.2  0.0 - 1.2 mg/dL Final   Alkaline Phosphatase 06/12/2021 73  44 - 121 IU/L  Final   AST 06/12/2021 15  0 - 40 IU/L Final   ALT 06/12/2021 21  0 - 44 IU/L Final    Physical Examination:  BP 122/62    Pulse 83    Ht 5' 10"  (1.778 m)    Wt 209 lb 3.2 oz (94.9 kg)    SpO2 96%    BMI 30.02 kg/m      ASSESSMENT:  Diabetes type 2, insulin-dependent  See history of present illness for detailed discussion of current diabetes management, blood sugar patterns and problems identified  His A1c is recently  7%  He is on Invokamet and basal bolus insulin   He likely has postprandial hyperglycemia which he does not monitor He is reluctant to try the freestyle libre despite discussing the advantages of this  Fasting readings are generally fairly good Over the last few weeks has not done well with diet and exercise and has gained back most of the weight he had lost  MICROALBUMINURIA: Relatively higher now On lisinopril 5 mg, blood pressure tends to be low normal along with high potassium Does not have a history of hypertension   LIPIDS: Well-controlled  PLAN:   He will need to start checking blood sugars consistently 2 hours after meals and less in the morning Discussed blood sugar targets after meals He will need to make sure he takes his NovoLog with every meal that has carbohydrate including lunch consistently has also breakfast Likely will not have as much expense on  his new insurance plan  He will cover breakfast also, likely need 6 to 8 units of NovoLog  Also take NovoLog with him when he is eating out at lunch  Again discussed adjusting the mealtime dose to keep readings after meals below 180 and also may reduce or increase the dose based on his meal size No change in [place] dosage as yet Switch Invokamet to Synjardy XR to reduce tendency to hyperkalemia  STOP lisinopril and start LOSARTAN 25 mg daily with less propensity for hyperkalemia  Continue to follow microalbumin, if microalbumin continues to increase may consider Kerendia  There are no  Patient Instructions on file for this visit.     Elayne Snare 06/17/2021, 8:16 AM   Note: This office note was prepared with Dragon voice recognition system technology. Any transcriptional errors that result from this process are unintentional.

## 2021-06-17 ENCOUNTER — Ambulatory Visit (INDEPENDENT_AMBULATORY_CARE_PROVIDER_SITE_OTHER): Payer: HMO | Admitting: Endocrinology

## 2021-06-17 ENCOUNTER — Telehealth: Payer: Self-pay | Admitting: Endocrinology

## 2021-06-17 ENCOUNTER — Other Ambulatory Visit: Payer: Self-pay

## 2021-06-17 ENCOUNTER — Other Ambulatory Visit (HOSPITAL_COMMUNITY): Payer: Self-pay

## 2021-06-17 VITALS — BP 122/62 | HR 83 | Ht 70.0 in | Wt 209.2 lb

## 2021-06-17 DIAGNOSIS — E1165 Type 2 diabetes mellitus with hyperglycemia: Secondary | ICD-10-CM | POA: Diagnosis not present

## 2021-06-17 DIAGNOSIS — I10 Essential (primary) hypertension: Secondary | ICD-10-CM

## 2021-06-17 DIAGNOSIS — E1129 Type 2 diabetes mellitus with other diabetic kidney complication: Secondary | ICD-10-CM

## 2021-06-17 DIAGNOSIS — Z794 Long term (current) use of insulin: Secondary | ICD-10-CM | POA: Diagnosis not present

## 2021-06-17 DIAGNOSIS — R809 Proteinuria, unspecified: Secondary | ICD-10-CM | POA: Diagnosis not present

## 2021-06-17 MED ORDER — SYNJARDY XR 12.5-1000 MG PO TB24: 2.0000 | ORAL_TABLET | Freq: Every day | ORAL | 3 refills | Status: DC

## 2021-06-17 MED ORDER — LOSARTAN POTASSIUM 25 MG PO TABS: 25.0000 mg | ORAL_TABLET | Freq: Every day | ORAL | 3 refills | Status: DC

## 2021-06-17 NOTE — Patient Instructions (Addendum)
Take Novolog before lunch unless eating only a salad  Stop Lisinopril  Check blood sugars on waking up 4-5  days a week  Also check blood sugars about 2 hours after meals and do this after different meals by rotation  Recommended blood sugar levels on waking up are 90-130 and about 2 hours after meal is 130-180  Please bring your blood sugar monitor to each visit, thank you  Walk more

## 2021-06-17 NOTE — Telephone Encounter (Signed)
Jose Munoz, Since he has had high potassium or high normal levels with lisinopril consistently this is going to be changed to losartan 25 mg daily.  Also his Theodis Sato will be changed to [place].  He will have his potassium checked again with you, thanks

## 2021-06-17 NOTE — Telephone Encounter (Signed)
Please check to see if Kirk Ruths or Davonna Belling are covered by his plan, will need to change him from [place], thanks

## 2021-06-18 ENCOUNTER — Other Ambulatory Visit: Payer: Self-pay | Admitting: Physician Assistant

## 2021-06-18 DIAGNOSIS — E781 Pure hyperglyceridemia: Secondary | ICD-10-CM

## 2021-06-18 DIAGNOSIS — E1169 Type 2 diabetes mellitus with other specified complication: Secondary | ICD-10-CM

## 2021-06-20 ENCOUNTER — Other Ambulatory Visit: Payer: HMO

## 2021-06-24 IMAGING — US US ABDOMINAL AORTA SCREENING AAA
1 series · 7 of 7 positions shown · non-contrast
Comparison: No prior.

CLINICAL DATA: Screening.

EXAM:
US ABDOMINAL AORTA MEDICARE SCREENING
TECHNIQUE: Ultrasound examination of the abdominal aorta was performed as a
screening evaluation for abdominal aortic aneurysm.

[Series 1: us abdominal aorta screening aaa · 0.38mm/px · 7 of 7 slices shown]
[im 1/7]
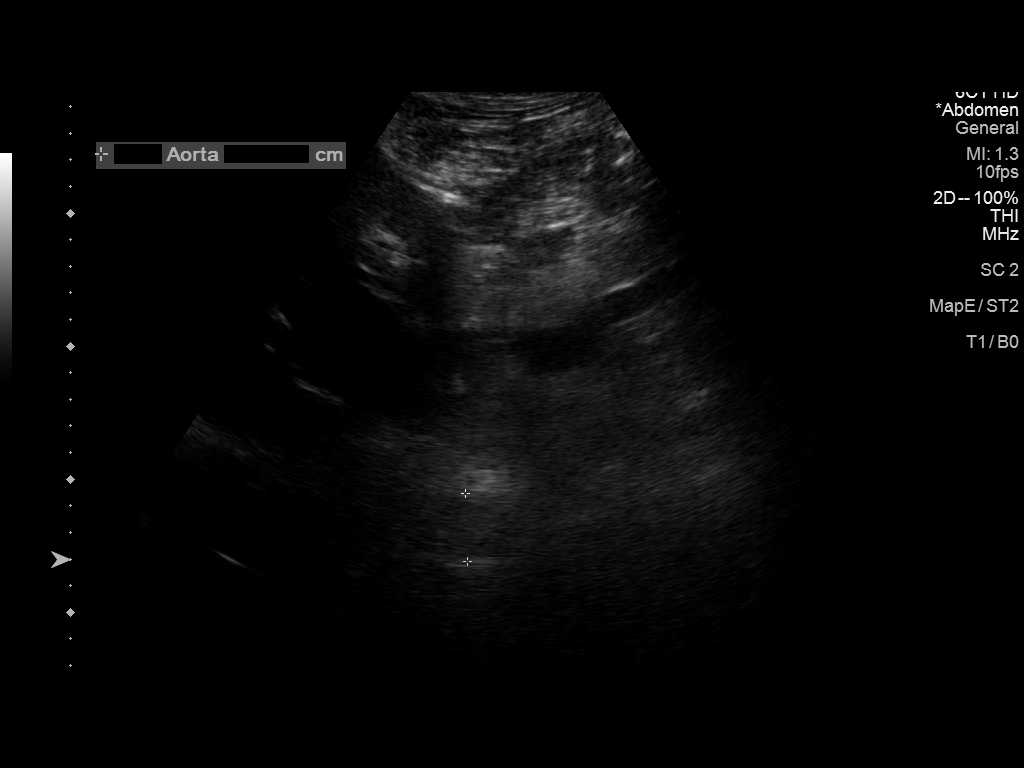
[im 2/7]
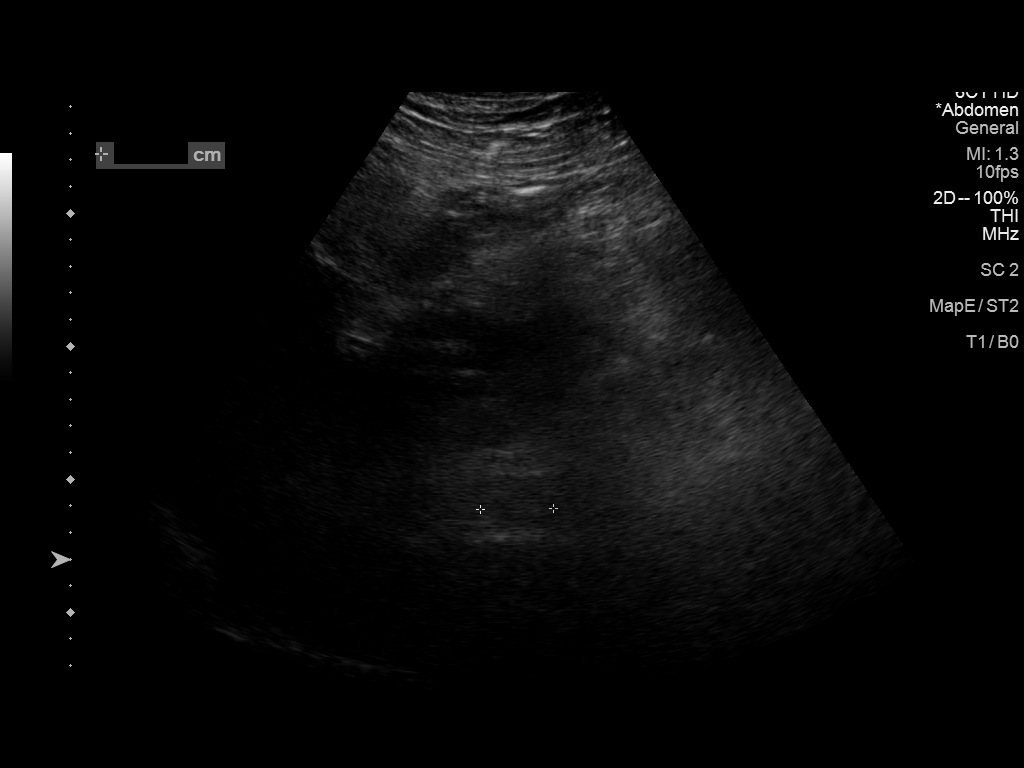
[im 3/7]
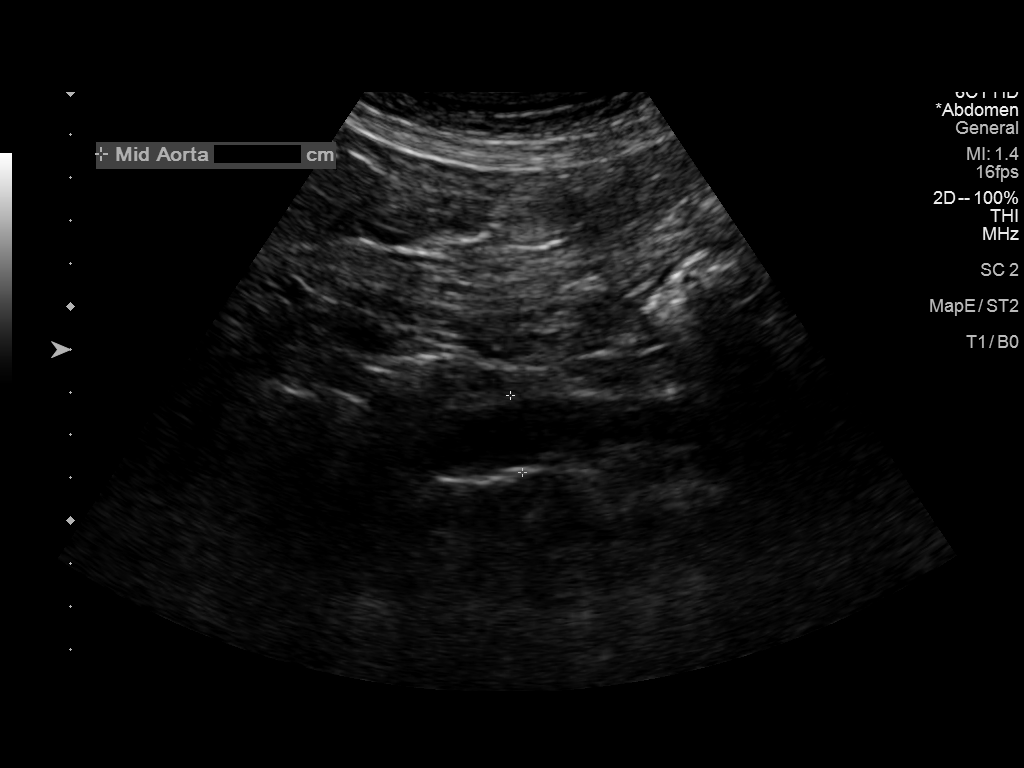
[im 4/7]
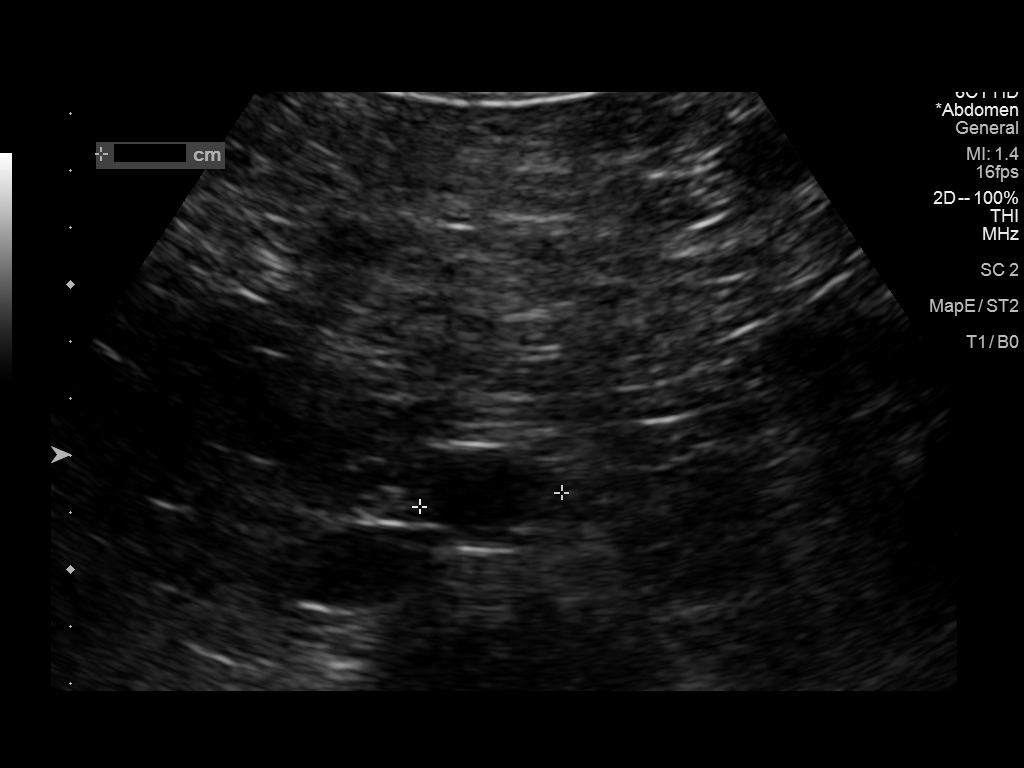
[im 5/7]
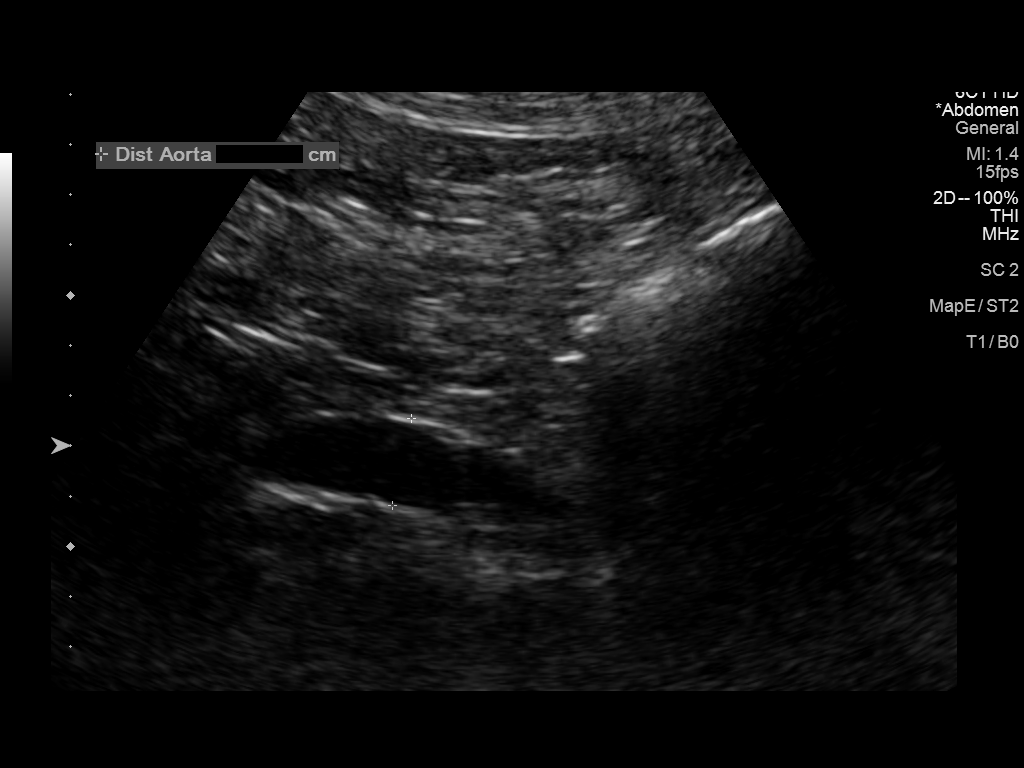
[im 6/7]
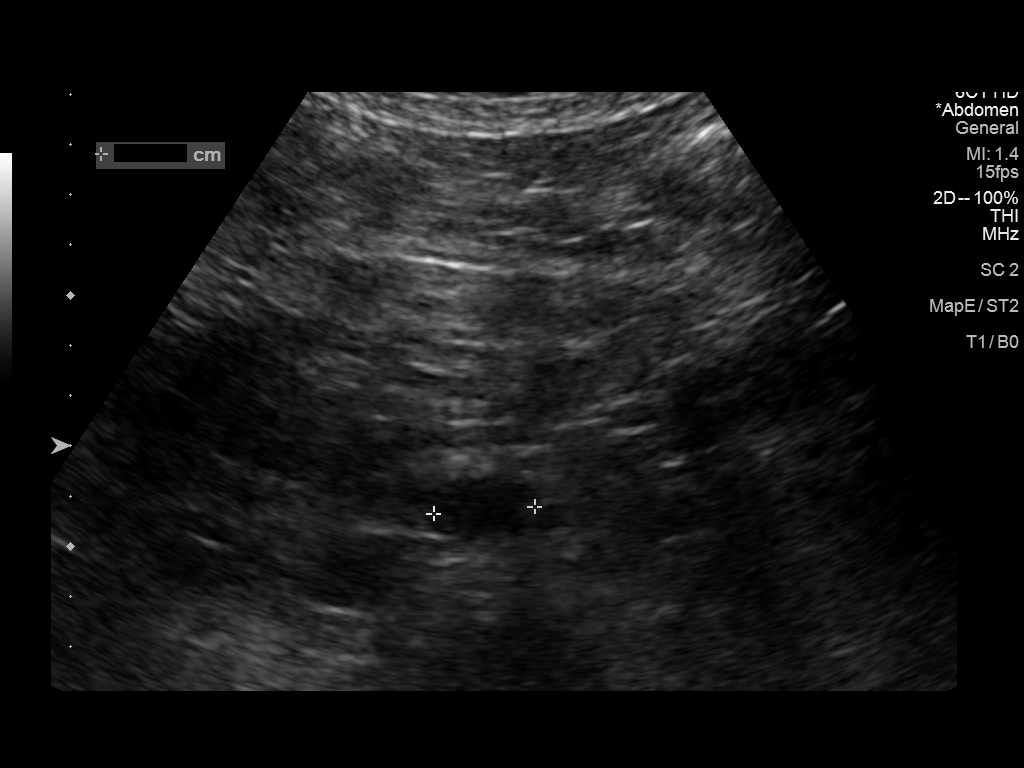
[im 7/7]
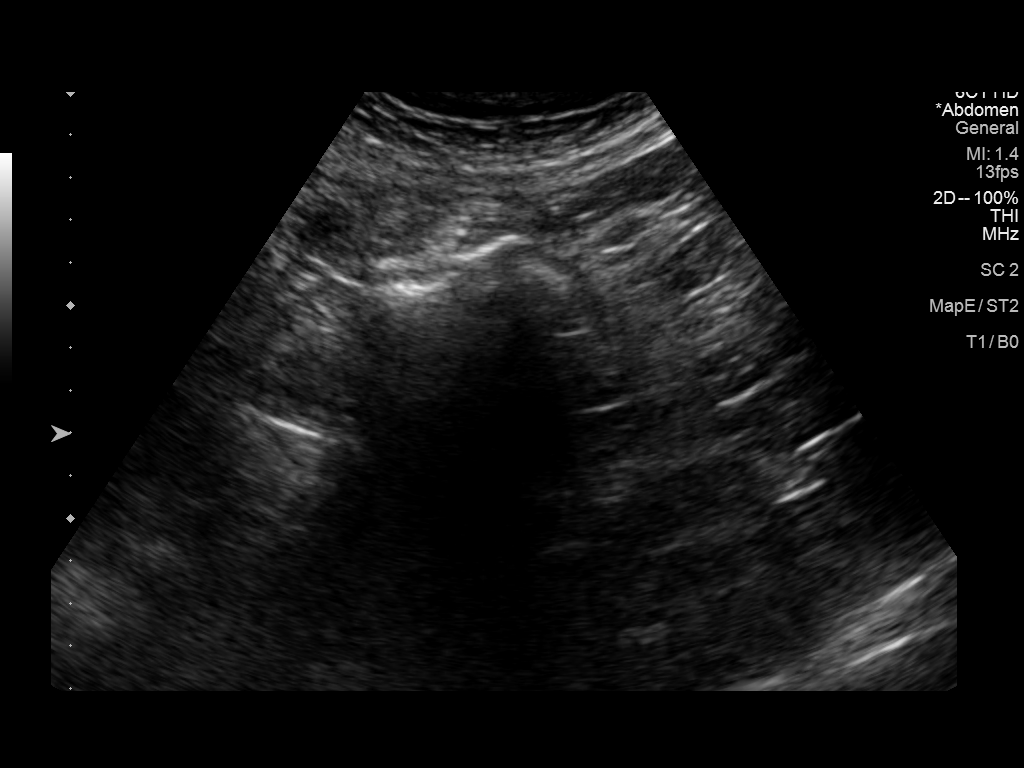

[7 of 7 positions shown; findings below may reference images not displayed]

FINDINGS: Abdominal aortic measurements as follows:

Proximal:  2.7 cm cm

Mid:  2.5 cm cm

Distal:  2.0 cm cm

Limited exam due to overlying bowel gas.
IMPRESSION: Limited exam due to overlying bowel gas. Maximum aortic diameter is
2.7 cm.

Ectatic abdominal aorta at risk for aneurysm development. Recommend
followup by ultrasound in 5 years. This recommendation follows ACR
consensus guidelines: White Paper of the ACR Incidental Findings
Committee II on Vascular Findings. [HOSPITAL] 3088;

Aortic aneurysm NOS ([id number].M)

## 2021-07-22 ENCOUNTER — Telehealth: Payer: Self-pay

## 2021-07-22 NOTE — Telephone Encounter (Signed)
[facility] called stating patient needs PA for insulin aspart. They will send form over today. ?

## 2021-07-30 NOTE — Telephone Encounter (Signed)
Was sent to Rx team a week ago and they confirmed receiving ?

## 2021-09-11 ENCOUNTER — Other Ambulatory Visit: Payer: HMO

## 2021-09-11 DIAGNOSIS — Z794 Long term (current) use of insulin: Secondary | ICD-10-CM | POA: Diagnosis not present

## 2021-09-11 DIAGNOSIS — E1165 Type 2 diabetes mellitus with hyperglycemia: Secondary | ICD-10-CM | POA: Diagnosis not present

## 2021-09-11 NOTE — Addendum Note (Signed)
Addended by: Aron Baba on: 09/11/2021 09:05 AM ? ? Modules accepted: Orders ? ?

## 2021-09-12 ENCOUNTER — Other Ambulatory Visit: Payer: Self-pay

## 2021-09-12 LAB — BASIC METABOLIC PANEL
BUN/Creatinine Ratio: 24 (ref 10–24)
BUN: 17 mg/dL (ref 8–27)
CO2: 18 mmol/L — ABNORMAL LOW (ref 20–29)
Calcium: 9.4 mg/dL (ref 8.6–10.2)
Chloride: 101 mmol/L (ref 96–106)
Creatinine, Ser: 0.71 mg/dL — ABNORMAL LOW (ref 0.76–1.27)
Glucose: 140 mg/dL — ABNORMAL HIGH (ref 70–99)
Potassium: 4.8 mmol/L (ref 3.5–5.2)
Sodium: 138 mmol/L (ref 134–144)
eGFR: 97 mL/min/{1.73_m2} (ref >59)

## 2021-09-12 LAB — HEMOGLOBIN A1C
Est. average glucose Bld gHb Est-mCnc: 171 mg/dL
Hgb A1c MFr Bld: 7.6 % — ABNORMAL HIGH (ref 4.8–5.6)

## 2021-09-18 ENCOUNTER — Encounter: Payer: Self-pay | Admitting: Endocrinology

## 2021-09-18 ENCOUNTER — Ambulatory Visit: Payer: HMO | Admitting: Endocrinology

## 2021-09-18 VITALS — BP 138/74 | HR 74 | Ht 71.5 in | Wt 209.4 lb

## 2021-09-18 DIAGNOSIS — R809 Proteinuria, unspecified: Secondary | ICD-10-CM

## 2021-09-18 DIAGNOSIS — E1165 Type 2 diabetes mellitus with hyperglycemia: Secondary | ICD-10-CM | POA: Diagnosis not present

## 2021-09-18 DIAGNOSIS — I10 Essential (primary) hypertension: Secondary | ICD-10-CM | POA: Diagnosis not present

## 2021-09-18 DIAGNOSIS — Z794 Long term (current) use of insulin: Secondary | ICD-10-CM

## 2021-09-18 DIAGNOSIS — E1129 Type 2 diabetes mellitus with other diabetic kidney complication: Secondary | ICD-10-CM

## 2021-09-18 MED ORDER — FREESTYLE LIBRE 2 SENSOR MISC: 2.0000 | 3 refills | Status: DC

## 2021-09-18 MED ORDER — BD PEN NEEDLE NANO 2ND GEN 32G X 4 MM MISC: 4 refills | Status: DC

## 2021-09-18 MED ORDER — INSULIN LISPRO (1 UNIT DIAL) 100 UNIT/ML (KWIKPEN): PEN_INJECTOR | SUBCUTANEOUS | 2 refills | Status: DC

## 2021-09-18 NOTE — Patient Instructions (Signed)
Check blood sugars on waking up 3 days a week ? ?Also check blood sugars about 2 hours after meals and do this after different meals by rotation ? ?Recommended blood sugar levels on waking up are 90-130 and about 2 hours after meal is 130-180 ? ?Please bring your blood sugar monitor to each visit, thank you ? ?If am sugar stays over 130 go up 2 U onTresiba ? ?Always take insulin for lunch ?

## 2021-09-18 NOTE — Progress Notes (Signed)
patient ID: Jose Munoz, male   DOB: 29-Mar-1951, 71 y.o.   MRN: [id number] ? ?       ? ? ?Reason for Appointment:  Follow-up for Type 2 Diabetes ? ? ?History of Present Illness:  ?        ?Date of diagnosis of type 2 diabetes mellitus: 1998      ? ?Background history:  ? ?He thinks his diabetes was relatively easier to control and the onset even though he had initial symptoms of thirst and weight loss ?He had done very well with his diet and lost a significant amount of weight and was approved to control his diabetes without medications for a few years ?Subsequently he started with oral medications including metformin, Actos and Amaryl ?He thinks his blood sugars were well controlled for a few years initially but started getting higher progressively since 2016. ?Because of poor control he was started on basal bolus insulin regimen on his initial consultation, highest A1c was 11.9 in 01/2016 ? ?Recent history:  ? ? ?INSULIN regimen: TRESIBA 42 units daily at bedtime, Novolog 0-8 at lunch, 10 units at dinner ? ?Non-insulin hypoglycemic drugs the patient is taking are: Synjardy XR 12.09/998, 2 tablets ? ?Current management, blood sugar patterns and problems identified: ?His A1c is 7.6 % and higher ? ?  ?Despite reminders he is not remembering to check his sugars after meals ?He also feels that he does not want to use the freestyle libre sensor and have something stuck on his arm ?He feels that his A1c is higher because of not taking cinnamon ?Although his weight is not any different he is not exercising and only occasionally playing golf ?He has been advised to take NovoLog at lunchtime but he forgets to take it when eating out ?Usually he is getting some carbohydrate at lunch regularly although not usually eating breakfast ?He will take 12 units of NovoLog at dinnertime when eating pasta but not clear what his sugars are after eating ?No hypoglycemia ?Fasting readings are variable and he thinks they may be higher if  he does not watch his diet the night before ?Once or twice a week he will have a large breakfast with grits and toast but not taking insulin for coverage also ?Renal function stable with insulin, which he is taking regularly ? ?      ?Side effects from medications have been: None ? ?Compliance with the medical regimen: Fair ? ?Glucose monitoring:  done about 1 times a day         Glucometer:   One Touch Verio ? ?Blood sugars by download ? ? ?PRE-MEAL Fasting Lunch Dinner Bedtime Overall  ?Glucose range: 108-169      ?Mean/median:     132  ? ?POST-MEAL PC Breakfast PC Lunch PC Dinner  ?Glucose range:   ?  ?Mean/median:     ? ?Previously: ? ?PRE-MEAL Fasting Lunch Dinner Bedtime Overall  ?Glucose range: 88-155 88, 111     ?Mean/median: 121    120  ? ?POST-MEAL PC Breakfast PC Lunch PC Dinner  ?Glucose range:   ?  ?Mean/median:     ? ? ?Self-care: The diet that the patient has been following is: tries to limit sweets and drinks with sugar .     ? ?Typical meal intake: Breakfast is egg, toast occasionally, sometimes skipped or; Lunch is a sandwich, has various snacks    ?Dinner 5-7 P.m. ?           ?Dietician visit,  most recent: 06/2016 ?CDE visit: 11/17 ?             ?Weight history: ? ?Wt Readings from Last 3 Encounters:  ?09/18/21 209 lb 6.4 oz (95 kg)  ?06/17/21 209 lb 3.2 oz (94.9 kg)  ?06/02/21 207 lb (93.9 kg)  ? ? ?Glycemic control: ?  ?Lab Results  ?Component Value Date  ? HGBA1C 7.6 (H) 09/11/2021  ? HGBA1C 7.0 (H) 06/02/2021  ? HGBA1C 6.4 (A) 02/04/2021  ? ?Lab Results  ?Component Value Date  ? MICROALBUR 80 09/24/2020  ? Jose Munoz 61 02/06/2021  ? CREATININE 0.71 (L) 09/11/2021  ? ?Lab Results  ?Component Value Date  ? MICRALBCREAT 217 (H) 06/02/2021  ? ? ? ? ? ?Allergies as of 09/18/2021   ?No Known Allergies ?  ? ?  ?Medication List  ?  ? ?  ? Accurate as of Sep 18, 2021  8:33 AM. If you have any questions, ask your nurse or doctor.  ?  ?  ? ?  ? ?aspirin 81 MG tablet ?Take 81 mg by mouth daily. ?  ?BD Pen  Needle Nano 2nd Gen 32G X 4 MM Misc ?Generic drug: Insulin Pen Needle ?USE TWICE DAILY ?  ?Insulin Aspart FlexPen 100 UNIT/ML ?Commonly known as: NOVOLOG ?INJECY 8-10 UNITS UNDER THE SKIN BEFORE MAIN MEAL DAILY ?  ?losartan 25 MG tablet ?Commonly known as: COZAAR ?Take 1 tablet (25 mg total) by mouth daily. ?  ?onetouch ultrasoft lancets ?Use to test blood sugar 2 times daily- Dx code E11.9 ?  ?OneTouch Verio test strip ?Generic drug: glucose blood ?TEST TWICE DAILY DX CODE E11.9 ?  ?simvastatin 40 MG tablet ?Commonly known as: ZOCOR ?TAKE 1 TABLET(40 MG) BY MOUTH DAILY AT 6 PM ?  ?Synjardy XR 12.09-998 MG Tb24 ?Generic drug: Empagliflozin-metFORMIN HCl ER ?Take 2 tablets by mouth daily. ?  ?Tyler Aas FlexTouch 200 UNIT/ML FlexTouch Pen ?Generic drug: insulin degludec ?ADMINISTER 40 UNITS UNDER THE SKIN DAILY ?What changed: See the new instructions. ?  ?Vitamin D (Ergocalciferol) 1.25 MG (50000 UNIT) Caps capsule ?Commonly known as: DRISDOL ?TAKE 1 CAPSULE BY MOUTH EVERY 7 DAYS ?  ? ?  ? ? ?Allergies: No Known Allergies ? ?Past Medical History:  ?Diagnosis Date  ? Diabetes mellitus without complication ([place])   ? Essential hypertension 10/16/2015  ? History of oral surgery   ? Hyperlipidemia   ? OA (osteoarthritis) of hip 11/05/2015  ? Obese 10/16/2015  ? Smoking   ? Vitamin D deficiency 11/05/2015  ? ? ?Past Surgical History:  ?Procedure Laterality Date  ? CATARACT EXTRACTION W/PHACO Left 08/12/2020  ? Procedure: CATARACT EXTRACTION PHACO AND INTRAOCULAR LENS PLACEMENT ([place]) LEFT DIABETIC [place];  Surgeon: Eulogio Bear, MD;  Location: [facility];  Service: Ophthalmology;  Laterality: Left;  3.75 ?0:30.1  ? CATARACT EXTRACTION W/PHACO Right 08/26/2020  ? Procedure: CATARACT EXTRACTION PHACO AND INTRAOCULAR LENS PLACEMENT ([place]) RIGHT DIABETIC PANOPTIX  LENS 2.82 00:26.1;  Surgeon: Eulogio Bear, MD;  Location: [facility];  Service: Ophthalmology;  Laterality: Right;  Diabetic   ? ? ?Family History  ?Problem Relation Age of Onset  ? Diabetes Mother   ? Congestive Heart Failure Mother   ? Cancer Sister   ?     breast  ? Cancer Sister   ?     cervical  ? Heart attack Brother   ? ? ?Social History:  reports that he has been smoking cigarettes. He has a 25.00 pack-year smoking  history. He has never used smokeless tobacco. He reports that he does not currently use alcohol. He reports that he does not use drugs. ? ? ?Review of Systems ? ? ?Lipid history: Has excellent LDL levels with taking simvastatin 40 mg prescribed by his PCP ?HDL low but triglycerides are normal ? ?  ?Lab Results  ?Component Value Date  ? CHOL 113 02/06/2021  ? HDL 34 (L) 02/06/2021  ? [place] 61 02/06/2021  ? TRIG 90 02/06/2021  ? CHOLHDL 3.3 02/06/2021  ?     ?    ?Hypertension:  is being treated with losartan 25 mg prescribed by PCP ?Also on Jardiance ?He will periodically check blood pressure at home ? ?BP Readings from Last 3 Encounters:  ?09/18/21 138/74  ?06/17/21 122/62  ?06/02/21 120/84  ? ?Potassium is improved with switching to losartan ? ? ?Lab Results  ?Component Value Date  ? K 4.8 09/11/2021  ? ? ?Has eye exams regularly, was told to have mild background retinopathy ? ?Most recent foot exam: 9/22 ? ?Taking Vitmain D from his PCP and was advised to take it only once a month ? ? ?LABS: ? ?No visits with results within 1 Week(s) from this visit.  ?Latest known visit with results is:  ?Lab on 09/11/2021  ?Component Date Value Ref Range Status  ? Glucose 09/11/2021 140 (H)  70 - 99 mg/dL Final  ? BUN 09/11/2021 17  8 - 27 mg/dL Final  ? Creatinine, Ser 09/11/2021 0.71 (L)  0.76 - 1.27 mg/dL Final  ? eGFR 09/11/2021 97  >59 mL/min/1.73 Final  ? BUN/Creatinine Ratio 09/11/2021 24  10 - 24 Final  ? Sodium 09/11/2021 138  134 - 144 mmol/L Final  ? Potassium 09/11/2021 4.8  3.5 - 5.2 mmol/L Final  ? Chloride 09/11/2021 101  96 - 106 mmol/L Final  ? CO2 09/11/2021 18 (L)  20 - 29 mmol/L Final  ? Calcium 09/11/2021 9.4   8.6 - 10.2 mg/dL Final  ? Hgb A1c MFr Bld 09/11/2021 7.6 (H)  4.8 - 5.6 % Final  ? Comment:          Prediabetes: 5.7 - 6.4 ?         Diabetes: >6.4 ?         Glycemic control for adults with diabetes: <7.0 ?  ?

## 2021-10-07 LAB — HM DIABETES EYE EXAM

## 2021-10-09 DIAGNOSIS — E119 Type 2 diabetes mellitus without complications: Secondary | ICD-10-CM | POA: Diagnosis not present

## 2021-10-20 ENCOUNTER — Other Ambulatory Visit: Payer: Self-pay | Admitting: Endocrinology

## 2021-10-20 DIAGNOSIS — E1165 Type 2 diabetes mellitus with hyperglycemia: Secondary | ICD-10-CM

## 2021-10-20 DIAGNOSIS — I10 Essential (primary) hypertension: Secondary | ICD-10-CM

## 2021-10-28 ENCOUNTER — Other Ambulatory Visit: Payer: Self-pay | Admitting: Endocrinology

## 2021-11-19 ENCOUNTER — Other Ambulatory Visit: Payer: Self-pay | Admitting: Endocrinology

## 2021-11-20 ENCOUNTER — Ambulatory Visit (INDEPENDENT_AMBULATORY_CARE_PROVIDER_SITE_OTHER): Payer: HMO | Admitting: Nurse Practitioner

## 2021-11-20 ENCOUNTER — Encounter: Payer: Self-pay | Admitting: Nurse Practitioner

## 2021-11-20 VITALS — BP 108/65 | HR 85 | Ht 71.0 in | Wt 208.8 lb

## 2021-11-20 DIAGNOSIS — M545 Low back pain, unspecified: Secondary | ICD-10-CM

## 2021-11-20 MED ORDER — METHOCARBAMOL 500 MG PO TABS: 500.0000 mg | ORAL_TABLET | Freq: Three times a day (TID) | ORAL | 1 refills | Status: DC | PRN

## 2021-11-20 NOTE — Progress Notes (Signed)
Established patient visit   Patient: Jose Munoz   DOB: 1951/04/01   71 y.o. Male  MRN: [id number] Visit Date: 11/20/2021  Chief Complaint  Patient presents with   Back Pain   Subjective    Back Pain Pertinent negatives include no chest pain, dysuria, fever, headaches or weakness.    Lower back pain and muscle spasms. -pain is mostly on the right side of the back but can radiate to the left  -there is no radiation of pain to he hips or the legs and feet . -going on for past two or three weeks.  -hurt it playing golf.  -has been putting ice and then heat on the area. Not really helping. -taking ibuprofen which has not helped at all.  -denies weakness in the legs. No gait interruption  -he states that he has been able to play golf since pain started. States that sometimes the lower back bothers him and sometimes it does not.   Medications: Outpatient Medications Prior to Visit  Medication Sig   aspirin 81 MG tablet Take 81 mg by mouth daily.   Continuous Blood Gluc Sensor (FREESTYLE LIBRE 2 SENSOR) MISC 2 Devices by Does not apply route every 14 (fourteen) days.   glucose blood (ONETOUCH VERIO) test strip TEST TWICE DAILY DX CODE E11.9   insulin lispro (HUMALOG KWIKPEN) 100 UNIT/ML KwikPen INJECT 8-10 UNITS UNDER THE SKIN BEFORE each MEAL DAILY   Insulin Pen Needle (BD PEN NEEDLE NANO 2ND GEN) 32G X 4 MM MISC Use TID with insulin pens   Lancets (ONETOUCH ULTRASOFT) lancets Use to test blood sugar 2 times daily- Dx code E11.9   losartan (COZAAR) 25 MG tablet TAKE 1 TABLET(25 MG) BY MOUTH DAILY   simvastatin (ZOCOR) 40 MG tablet TAKE 1 TABLET(40 MG) BY MOUTH DAILY AT 6 PM   SYNJARDY XR 12.09-998 MG TB24 TAKE 2 TABLETS BY MOUTH DAILY   TRESIBA FLEXTOUCH 200 UNIT/ML FlexTouch Pen ADMINISTER 40 UNITS UNDER THE SKIN DAILY   Vitamin D, Ergocalciferol, (DRISDOL) 1.25 MG (50000 UNIT) CAPS capsule TAKE 1 CAPSULE BY MOUTH EVERY 7 DAYS (Patient not taking: Reported on 06/17/2021)   No  facility-administered medications prior to visit.    Review of Systems  Constitutional:  Negative for activity change, chills, fatigue and fever.  HENT:  Negative for congestion, postnasal drip, rhinorrhea, sinus pressure, sinus pain, sneezing and sore throat.   Eyes: Negative.   Respiratory:  Negative for cough, shortness of breath and wheezing.   Cardiovascular:  Negative for chest pain and palpitations.  Gastrointestinal:  Negative for constipation, diarrhea, nausea and vomiting.  Endocrine: Negative for cold intolerance, heat intolerance, polydipsia and polyuria.       Patient is insulin dependant diabetic and sees endocrinology provider.   Genitourinary:  Negative for dysuria, frequency and urgency.  Musculoskeletal:  Positive for back pain and myalgias.  Skin:  Negative for rash.  Allergic/Immunologic: Negative for environmental allergies.  Neurological:  Negative for dizziness, weakness and headaches.  Psychiatric/Behavioral:  The patient is not nervous/anxious.      Objective     Today's Vitals   11/20/21 1049  BP: 108/65  Pulse: 85  SpO2: 96%  Weight: 208 lb 12.8 oz (94.7 kg)  Height: 5\' 11"  (1.803 m)   Body mass index is 29.12 kg/m.   Physical Exam Vitals and nursing note reviewed.  Constitutional:      Appearance: Normal appearance. He is well-developed.  HENT:     Head: Normocephalic and atraumatic.  Eyes:  Pupils: Pupils are equal, round, and reactive to light.  Cardiovascular:     Rate and Rhythm: Normal rate and regular rhythm.     Pulses: Normal pulses.     Heart sounds: Normal heart sounds.  Pulmonary:     Effort: Pulmonary effort is normal.     Breath sounds: Normal breath sounds.  Abdominal:     Palpations: Abdomen is soft.  Musculoskeletal:        General: Normal range of motion.     Cervical back: Normal range of motion and neck supple.       Back:  Lymphadenopathy:     Cervical: No cervical adenopathy.  Skin:    General: Skin is warm  and dry.     Capillary Refill: Capillary refill takes less than 2 seconds.  Neurological:     General: No focal deficit present.     Mental Status: He is alert and oriented to person, place, and time.  Psychiatric:        Mood and Affect: Mood normal.        Behavior: Behavior normal.        Thought Content: Thought content normal.        Judgment: Judgment normal.       Assessment & Plan     1. Acute bilateral low back pain without sciatica Advised patient to continue applying heat to effected area when needed. Take ibuprofen as needed and as indicated. Add robaxin 500 mg up to three times daily as needed for muscle pain and spasms. Advised him to take this with caution as it may cause him to have dizziness or drowsiness. He voiced understanding.  - methocarbamol (ROBAXIN) 500 MG tablet; Take 1 tablet (500 mg total) by mouth every 8 (eight) hours as needed for muscle spasms.  Dispense: 30 tablet; Refill: 1    Return for prn worsening or persistent symptoms.        Carlean Jews, NP  [facility] [phone] (phone) [phone] (fax)  [facility]

## 2021-12-15 ENCOUNTER — Other Ambulatory Visit: Payer: Self-pay | Admitting: Physician Assistant

## 2021-12-15 DIAGNOSIS — E1169 Type 2 diabetes mellitus with other specified complication: Secondary | ICD-10-CM

## 2021-12-18 ENCOUNTER — Other Ambulatory Visit: Payer: HMO

## 2021-12-18 ENCOUNTER — Other Ambulatory Visit: Payer: Self-pay | Admitting: Endocrinology

## 2021-12-18 DIAGNOSIS — R809 Proteinuria, unspecified: Secondary | ICD-10-CM | POA: Diagnosis not present

## 2021-12-18 DIAGNOSIS — E1165 Type 2 diabetes mellitus with hyperglycemia: Secondary | ICD-10-CM | POA: Diagnosis not present

## 2021-12-18 DIAGNOSIS — Z794 Long term (current) use of insulin: Secondary | ICD-10-CM | POA: Diagnosis not present

## 2021-12-18 DIAGNOSIS — E1129 Type 2 diabetes mellitus with other diabetic kidney complication: Secondary | ICD-10-CM | POA: Diagnosis not present

## 2021-12-19 LAB — BASIC METABOLIC PANEL
BUN/Creatinine Ratio: 19 (ref 10–24)
BUN: 13 mg/dL (ref 8–27)
CO2: 22 mmol/L (ref 20–29)
Calcium: 9.3 mg/dL (ref 8.6–10.2)
Chloride: 101 mmol/L (ref 96–106)
Creatinine, Ser: 0.7 mg/dL — ABNORMAL LOW (ref 0.76–1.27)
Glucose: 120 mg/dL — ABNORMAL HIGH (ref 70–99)
Potassium: 4.4 mmol/L (ref 3.5–5.2)
Sodium: 138 mmol/L (ref 134–144)
eGFR: 97 mL/min/{1.73_m2} (ref >59)

## 2021-12-19 LAB — HEMOGLOBIN A1C
Est. average glucose Bld gHb Est-mCnc: 151 mg/dL
Hgb A1c MFr Bld: 6.9 % — ABNORMAL HIGH (ref 4.8–5.6)

## 2021-12-19 LAB — MICROALBUMIN / CREATININE URINE RATIO
Creatinine, Urine: 25.6 mg/dL
Microalb/Creat Ratio: 327 mg/g creat — ABNORMAL HIGH (ref 0–29)
Microalbumin, Urine: 83.7 ug/mL

## 2021-12-24 NOTE — Progress Notes (Unsigned)
patient ID: Jose Munoz, male   DOB: April 30, 1951, 70 y.o.   MRN: [id number]           Reason for Appointment:  Follow-up for Type 2 Diabetes   History of Present Illness:          Date of diagnosis of type 2 diabetes mellitus: 1998       Background history:   He thinks his diabetes was relatively easier to control and the onset even though he had initial symptoms of thirst and weight loss He had done very well with his diet and lost a significant amount of weight and was approved to control his diabetes without medications for a few years Subsequently he started with oral medications including metformin, Actos and Amaryl He thinks his blood sugars were well controlled for a few years initially but started getting higher progressively since 2016. Because of poor control he was started on basal bolus insulin regimen on his initial consultation, highest A1c was 11.9 in 01/2016  Recent history:    INSULIN regimen: TRESIBA 42 units daily at bedtime, Novolog 0-8 at lunch, 10 units at dinner  Non-insulin hypoglycemic drugs the patient is taking are: Synjardy XR 12.09/998, 2 tablets  Current management, blood sugar patterns and problems identified: His A1c is back down to 6.9 % and previously 7.6  He has finally started using the freestyle libre sensor and checking multiple times a day now He does think that with monitoring more frequently he is able to adjust his insulin based on what he is eating  Still has periodic postprandial readings over 200  Some of this is related to forgetting his insulin when he is going out to eat one of his meals although he is trying to do better  Also not covering the dawn phenomenon where he is having high sugars early morning which may be also increased by coffee  Because of hip and back pain he is not able to do much formal exercise but is trying to play more golf  Overnight blood sugars fairly good with current dose of Tresiba No hypoglycemia also   Weight is about the same  He is concerned about the cost of Synjardy in the donut hole  However is also asking about Ozempic        Side effects from medications have been: None  CONTINUOUS GLUCOSE MONITORING RECORD INTERPRETATION    Dates of Recording: Last 2 weeks  Sensor description: Libre 2  Results statistics:   CGM use % of time   Average and GV 149/20  Time in range       86 %  % Time Above 180 14  % Time above 250   % Time Below target 0    PRE-MEAL 4-6 AM Lunch Dinner Bedtime Overall  Glucose range:       Mean/median: 128       POST-MEAL PC Breakfast PC Lunch PC Dinner  Glucose range:     Mean/median: 155 160 175    Glycemic patterns summary: Blood sugars are well-controlled with only mild variability and highest readings after dinner and no hypoglycemia  Hyperglycemic episodes are occurring intermittently midday, early afternoon and late evening  Hypoglycemic episodes have not occurred   Overnight periods: Glucose is mildly increased at midnight averaging 140 and then declining till 4 AM and subsequently slightly higher, no hypoglycemia  Preprandial periods: Blood sugars are in the low 100s at all times especially dinnertime but slightly higher at lunch target  Postprandial  periods:   After breakfast:   Blood sugars are mostly on an average within the target range with only sporadic readings above 180 After lunch:   Overall again blood sugars are fairly well-controlled with sporadic high readings over upper limit After dinner: Blood sugars are rising the most compared to other meals and on average rising about 40-50 mg with averages as below  Prior   PRE-MEAL Fasting Lunch Dinner Bedtime Overall  Glucose range: 108-169      Mean/median:     132   POST-MEAL PC Breakfast PC Lunch PC Dinner  Glucose range:   ?  Mean/median:        Self-care: The diet that the patient has been following is: tries to limit sweets and drinks with sugar .       Typical meal intake: Breakfast is egg, toast occasionally, sometimes skipped or; Lunch is a sandwich, has various snacks    Dinner 5-7 P.m.            Dietician visit, most recent: 06/2016 CDE visit: 11/17              Weight history:  Wt Readings from Last 3 Encounters:  12/25/21 211 lb 6.4 oz (95.9 kg)  11/20/21 208 lb 12.8 oz (94.7 kg)  09/18/21 209 lb 6.4 oz (95 kg)    Glycemic control:   Lab Results  Component Value Date   HGBA1C 6.9 (H) 12/18/2021   HGBA1C 7.6 (H) 09/11/2021   HGBA1C 7.0 (H) 06/02/2021   Lab Results  Component Value Date   MICROALBUR 80 09/24/2020   LDLCALC 61 02/06/2021   CREATININE 0.70 (L) 12/18/2021   Lab Results  Component Value Date   MICRALBCREAT 327 (H) 12/18/2021       Allergies as of 12/25/2021   No Known Allergies      Medication List        Accurate as of December 25, 2021  8:40 AM. If you have any questions, ask your nurse or doctor.          aspirin 81 MG tablet Take 81 mg by mouth daily.   BD Pen Needle Nano 2nd Gen 32G X 4 MM Misc Generic drug: Insulin Pen Needle Use TID with insulin pens   FreeStyle Libre 2 Sensor Misc 2 Devices by Does not apply route every 14 (fourteen) days.   insulin lispro 100 UNIT/ML KwikPen Commonly known as: HumaLOG KwikPen INJECT 8-10 UNITS UNDER THE SKIN BEFORE each MEAL DAILY   losartan 25 MG tablet Commonly known as: COZAAR TAKE 1 TABLET(25 MG) BY MOUTH DAILY   methocarbamol 500 MG tablet Commonly known as: ROBAXIN Take 1 tablet (500 mg total) by mouth every 8 (eight) hours as needed for muscle spasms.   onetouch ultrasoft lancets Use to test blood sugar 2 times daily- Dx code E11.9   OneTouch Verio test strip Generic drug: glucose blood TEST TWICE DAILY DX CODE E11.9   simvastatin 40 MG tablet Commonly known as: ZOCOR TAKE 1 TABLET(40 MG) BY MOUTH DAILY AT 6 PM   Synjardy XR 12.09-998 MG Tb24 Generic drug: Empagliflozin-metFORMIN HCl ER TAKE 2 TABLETS BY MOUTH  DAILY   Tresiba FlexTouch 200 UNIT/ML FlexTouch Pen Generic drug: insulin degludec ADMINISTER 40 UNITS UNDER THE SKIN DAILY   Vitamin D (Ergocalciferol) 1.25 MG (50000 UNIT) Caps capsule Commonly known as: DRISDOL TAKE 1 CAPSULE BY MOUTH EVERY 7 DAYS        Allergies: No Known Allergies  Past Medical History:  Diagnosis Date   Diabetes mellitus without complication ([place])    Essential hypertension 10/16/2015   History of oral surgery    Hyperlipidemia    OA (osteoarthritis) of hip 11/05/2015   Obese 10/16/2015   Smoking    Vitamin D deficiency 11/05/2015    Past Surgical History:  Procedure Laterality Date   CATARACT EXTRACTION W/PHACO Left 08/12/2020   Procedure: CATARACT EXTRACTION PHACO AND INTRAOCULAR LENS PLACEMENT ([place]) LEFT DIABETIC [place];  Surgeon: Eulogio Bear, MD;  Location: [place];  Service: Ophthalmology;  Laterality: Left;  3.75 0:30.1   CATARACT EXTRACTION W/PHACO Right 08/26/2020   Procedure: CATARACT EXTRACTION PHACO AND INTRAOCULAR LENS PLACEMENT ([facility]) RIGHT DIABETIC [place]  LENS 2.82 00:26.1;  Surgeon: Eulogio Bear, MD;  Location: [place];  Service: Ophthalmology;  Laterality: Right;  Diabetic    Family History  Problem Relation Age of Onset   Diabetes Mother    Congestive Heart Failure Mother    Cancer Sister        breast   Cancer Sister        cervical   Heart attack Brother     Social History:  reports that he has been smoking cigarettes. He has a 25.00 pack-year smoking history. He has never used smokeless tobacco. He reports that he does not currently use alcohol. He reports that he does not use drugs.   Review of Systems   Lipid history: Has excellent LDL levels with taking simvastatin 40 mg prescribed by his PCP HDL low but triglycerides are normal    Lab Results  Component Value Date   CHOL 113 02/06/2021   HDL 34 (L) 02/06/2021   LDLCALC 61 02/06/2021   TRIG 90 02/06/2021    CHOLHDL 3.3 02/06/2021           Hypertension:  is being treated with losartan 25 mg prescribed by PCP Also on Jardiance   BP Readings from Last 3 Encounters:  12/25/21 130/70  11/20/21 108/65  09/18/21 138/74   No recurrence of hyperkalemia  Lab Results  Component Value Date   K 4.4 12/18/2021    Has eye exams regularly, was told to have mild background retinopathy, last exam 5/23  Most recent foot exam: 9/22  Taking vitamin D from his PCP and will need periodic levels   LABS:  No visits with results within 1 Week(s) from this visit.  Latest known visit with results is:  Lab on 12/18/2021  Component Date Value Ref Range Status   Creatinine, Urine 12/18/2021 25.6  Not Estab. mg/dL Final   Microalbumin, Urine 12/18/2021 83.7  Not Estab. ug/mL Final   Microalb/Creat Ratio 12/18/2021 327 (H)  0 - 29 mg/g creat Final   Comment:                        Normal:                0 -  29                        Moderately increased: 30 - 300                        Severely increased:       >300    Hgb A1c MFr Bld 12/18/2021 6.9 (H)  4.8 - 5.6 % Final   Comment:  Prediabetes: 5.7 - 6.4          Diabetes: >6.4          Glycemic control for adults with diabetes: <7.0    Est. average glucose Bld gHb Est-m* 12/18/2021 151  mg/dL Final   Glucose 12/18/2021 120 (H)  70 - 99 mg/dL Final   BUN 12/18/2021 13  8 - 27 mg/dL Final   Creatinine, Ser 12/18/2021 0.70 (L)  0.76 - 1.27 mg/dL Final   eGFR 12/18/2021 97  >59 mL/min/1.73 Final   BUN/Creatinine Ratio 12/18/2021 19  10 - 24 Final   Sodium 12/18/2021 138  134 - 144 mmol/L Final   Potassium 12/18/2021 4.4  3.5 - 5.2 mmol/L Final   Chloride 12/18/2021 101  96 - 106 mmol/L Final   CO2 12/18/2021 22  20 - 29 mmol/L Final   Calcium 12/18/2021 9.3  8.6 - 10.2 mg/dL Final    Physical Examination:  BP 130/70   Pulse 80   Ht 5' 10.5" (1.791 m)   Wt 211 lb 6.4 oz (95.9 kg)   SpO2 92%   BMI 29.90 kg/m       ASSESSMENT:  Diabetes type 2, insulin-dependent  See history of present illness for detailed discussion of current diabetes management, blood sugar patterns and problems identified  His A1c is 6.9, was 7.6   He is on Synjardy XR and basal bolus insulin   A1c is improving  With using the sensor he is doing better with his mealtime insulin although still likely not taking enough insulin to cover dinner  Hypertension: Well-controlled  Microalbuminuria: This is progressively higher despite taking Jardiance and losartan and better diabetes control Renal function so far normal  PLAN:   He will continue the freestyle libre which will help him adjust his insulin and help with compliance.   He will try to take his mealtime insulin device consistently when eating out  Discussed benefits of taking Jardiance and he agrees to continue despite high cost  discussed above and he will let us know if he needs help with starting Given him the option of using the V-go pump for simplicity and especially with compliance with mealtime insulin when eating out but he prefers to do injections.  Also may be a candidate for the CeQur Simplcity device if affordable  Continue to follow microalbumin, if microalbumin continues to increase may consider [place] Clamp  To follow-up with PCP regarding lipid management  There are no Patient Instructions on file for this visit.     Elayne Snare 12/25/2021, 8:40 AM   Note: This office note was prepared with Dragon voice recognition system technology. Any transcriptional errors that result from this process are unintentional.

## 2021-12-25 ENCOUNTER — Ambulatory Visit: Payer: HMO | Admitting: Endocrinology

## 2021-12-25 ENCOUNTER — Encounter: Payer: Self-pay | Admitting: Endocrinology

## 2021-12-25 VITALS — BP 130/70 | HR 80 | Ht 70.5 in | Wt 211.4 lb

## 2021-12-25 DIAGNOSIS — E1165 Type 2 diabetes mellitus with hyperglycemia: Secondary | ICD-10-CM | POA: Diagnosis not present

## 2021-12-25 DIAGNOSIS — Z794 Long term (current) use of insulin: Secondary | ICD-10-CM

## 2021-12-25 DIAGNOSIS — E1129 Type 2 diabetes mellitus with other diabetic kidney complication: Secondary | ICD-10-CM

## 2021-12-25 DIAGNOSIS — R809 Proteinuria, unspecified: Secondary | ICD-10-CM | POA: Diagnosis not present

## 2021-12-25 NOTE — Patient Instructions (Addendum)
Take Humalog at all meals  Check blood sugars on waking up   Also check blood sugars about 2 hours after meals and do this after different meals by rotation  Recommended blood sugar levels on waking up are 90-130 and about 2 hours after meal is 130-180  Please bring your blood sugar monitor to each visit, thank you  Check into V-go pump

## 2021-12-29 ENCOUNTER — Encounter: Payer: Self-pay | Admitting: Intensive Care

## 2021-12-29 ENCOUNTER — Emergency Department: Payer: PPO

## 2021-12-29 ENCOUNTER — Other Ambulatory Visit: Payer: Self-pay

## 2021-12-29 ENCOUNTER — Emergency Department
Admission: EM | Admit: 2021-12-29 | Discharge: 2021-12-29 | Disposition: A | Discharge status: Home / Self Care | Payer: PPO | Attending: Emergency Medicine | Admitting: Emergency Medicine

## 2021-12-29 DIAGNOSIS — W01198A Fall on same level from slipping, tripping and stumbling with subsequent striking against other object, initial encounter: Secondary | ICD-10-CM | POA: Diagnosis not present

## 2021-12-29 DIAGNOSIS — F17200 Nicotine dependence, unspecified, uncomplicated: Secondary | ICD-10-CM | POA: Insufficient documentation

## 2021-12-29 DIAGNOSIS — R0781 Pleurodynia: Secondary | ICD-10-CM | POA: Diagnosis not present

## 2021-12-29 DIAGNOSIS — I10 Essential (primary) hypertension: Secondary | ICD-10-CM | POA: Diagnosis not present

## 2021-12-29 DIAGNOSIS — S20212A Contusion of left front wall of thorax, initial encounter: Secondary | ICD-10-CM | POA: Insufficient documentation

## 2021-12-29 DIAGNOSIS — E119 Type 2 diabetes mellitus without complications: Secondary | ICD-10-CM | POA: Insufficient documentation

## 2021-12-29 DIAGNOSIS — S299XXA Unspecified injury of thorax, initial encounter: Secondary | ICD-10-CM | POA: Diagnosis present

## 2021-12-29 MED ORDER — HYDROCODONE-ACETAMINOPHEN 5-325 MG PO TABS: 1.0000 | ORAL_TABLET | Freq: Four times a day (QID) | ORAL | 0 refills | Status: DC | PRN

## 2021-12-29 NOTE — ED Triage Notes (Signed)
Patient had fall Saturday and wants xray of left rib cage

## 2021-12-29 NOTE — ED Notes (Signed)
See triage note  Presents with pain to left posterior rib area   States he fell on Saturday  hitting his ribs on step

## 2021-12-29 NOTE — ED Provider Notes (Signed)
[facility] Provider Note    None    (approximate)   History   Rib Injury   HPI  Jose Munoz is a 71 y.o. male   presents to the ED with complaint of left rib pain.  Patient states he was walking his dog when his dog started chasing another animal.  Patient states he twisted his ankle causing him to fall backwards and his ribs hit directly on a step.  Later that day patient was able to play golf but stopped at 13 holes.  Patient has been taking anti-inflammatories and states it is slightly better than it has been but was concerned that he may have some fractured ribs.  She has a history of hypertension, diabetes mellitus, osteoarthritis and smoking.      Physical Exam   Triage Vital Signs: ED Triage Vitals  Enc Vitals Group     BP 12/29/21 0815 (!) 170/78     Pulse Rate 12/29/21 0815 87     Resp 12/29/21 0815 16     Temp 12/29/21 0815 97.6 F (36.4 C)     Temp Source 12/29/21 0815 Oral     SpO2 12/29/21 0815 96 %     Weight 12/29/21 0809 208 lb (94.3 kg)     Height 12/29/21 0809 5' 10.5" (1.791 m)     Head Circumference --      Peak Flow --      Pain Score 12/29/21 0808 4     Pain Loc --      Pain Edu? --      Excl. in GC? --     Most recent vital signs: Vitals:   12/29/21 0815 12/29/21 0914  BP: (!) 170/78 (!) 168/70  Pulse: 87 80  Resp: 16 16  Temp: 97.6 F (36.4 C)   SpO2: 96% 97%     General: Awake, no distress.  CV:  Good peripheral perfusion.  Resp:  Normal effort.  Tenderness on palpation of the left lateral ribs at approximately 7, eighth and ninth.  No skin discoloration or abrasions are noted.  Lung's are clear bilaterally. Abd:  No distention.  Other:  Amatory without any assistance.   ED Results / Procedures / Treatments   Labs (all labs ordered are listed, but only abnormal results are displayed) Labs Reviewed - No data to display   RADIOLOGY Left rib x-ray images were reviewed by myself and no acute  fracture was noted.  Radiology report confirms that no fracture is identified.    PROCEDURES:  Critical Care performed:   Procedures   MEDICATIONS ORDERED IN ED: Medications - No data to display   IMPRESSION / MDM / ASSESSMENT AND PLAN / ED COURSE  I reviewed the triage vital signs and the nursing notes.   Differential diagnosis includes, but is not limited to, fractured left ribs, contusion, fall injury, muscle skeletal pain.  71 year old male presents to the ED after a fall that occurred while walking his dog.  Patient is here concerned of possible rib fracture.  X-rays were reassuring and patient was made aware.  He will continue taking the anti-inflammatory however for his difficulty with sleeping a prescription for hydrocodone was sent to the pharmacy.  He is aware that he cannot drive or operate machinery while taking this medication.  He is to follow-up with his PCP if any continued problems.      Patient's presentation is most consistent with acute complicated illness / injury requiring diagnostic workup.  FINAL CLINICAL IMPRESSION(S) / ED DIAGNOSES   Final diagnoses:  Contusion of ribs, left, initial encounter     Rx / DC Orders   ED Discharge Orders          Ordered    HYDROcodone-acetaminophen (NORCO/VICODIN) 5-325 MG tablet  Every 6 hours PRN        12/29/21 0908             Note:  This document was prepared using Dragon voice recognition software and may include unintentional dictation errors.   Tommi Rumps, PA-C 12/29/21 [id number]    Sharman Cheek, MD 12/29/21 1335

## 2021-12-29 NOTE — Discharge Instructions (Signed)
Follow-up with your primary care provider if any continued problems.  You may apply ice to ribs as needed for discomfort.  May also apply a pillow against your ribs if you cough or sneeze which will help protect your ribs from hurting as much.  Continue with the anti-inflammatory with food.  Hydrocodone was sent to the pharmacy to take as needed for moderate to severe pain or if you are sleeping.  Do not drive or operate machinery while taking this medication as it could cause drowsiness.

## 2022-01-01 ENCOUNTER — Encounter: Payer: Self-pay | Admitting: Endocrinology

## 2022-02-06 ENCOUNTER — Other Ambulatory Visit: Payer: Self-pay | Admitting: Endocrinology

## 2022-02-17 ENCOUNTER — Other Ambulatory Visit: Payer: Self-pay | Admitting: Endocrinology

## 2022-02-17 DIAGNOSIS — I10 Essential (primary) hypertension: Secondary | ICD-10-CM

## 2022-02-25 ENCOUNTER — Other Ambulatory Visit: Payer: Self-pay | Admitting: Endocrinology

## 2022-03-17 ENCOUNTER — Other Ambulatory Visit: Payer: Self-pay | Admitting: Endocrinology

## 2022-03-17 DIAGNOSIS — E1165 Type 2 diabetes mellitus with hyperglycemia: Secondary | ICD-10-CM

## 2022-03-19 ENCOUNTER — Ambulatory Visit (INDEPENDENT_AMBULATORY_CARE_PROVIDER_SITE_OTHER): Payer: HMO | Admitting: Physician Assistant

## 2022-03-19 ENCOUNTER — Encounter: Payer: Self-pay | Admitting: Physician Assistant

## 2022-03-19 VITALS — BP 132/75 | HR 93 | Temp 97.8°F | Resp 18 | Ht 70.08 in | Wt 210.9 lb

## 2022-03-19 DIAGNOSIS — Z23 Encounter for immunization: Secondary | ICD-10-CM

## 2022-03-19 DIAGNOSIS — Z0000 Encounter for general adult medical examination without abnormal findings: Secondary | ICD-10-CM

## 2022-03-19 NOTE — Addendum Note (Signed)
Addended by: Lorrene Reid on: 03/19/2022 12:14 PM   Modules accepted: Level of Service

## 2022-03-19 NOTE — Progress Notes (Addendum)
Subjective:   Timothey Dahlstrom is a 71 y.o. male who presents for Medicare Annual/Subsequent preventive examination.  Review of Systems    General:   No F/C, wt loss Pulm:   No DIB, SOB, pleuritic chest pain Card:  No CP, palpitations Abd:  No n/v/d or pain Ext:  No edema        Objective:    Today's Vitals   03/19/22 1001  BP: 132/75  Pulse: 93  Resp: 18  Temp: 97.8 F (36.6 C)  TempSrc: Temporal  SpO2: 95%  Weight: 210 lb 14.4 oz (95.7 kg)  Height: 5' 10.08" (1.78 m)  PainSc: 0-No pain   Body mass index is 30.19 kg/m.     12/29/2021    8:09 AM 08/26/2020    6:26 AM 08/12/2020    8:57 AM 07/02/2016    2:20 PM 10/16/2015    2:30 PM  Advanced Directives  Does Patient Have a Medical Advance Directive? No Yes Yes Yes Yes  Type of Special educational needs teacher of [place];Living will Healthcare Power of [place];Living will  Healthcare Power of [place];Living will  Does patient want to make changes to medical advance directive?  No - Patient declined No - Patient declined  No - Patient declined  Copy of Healthcare Power of Attorney in Chart?  Yes - validated most recent copy scanned in chart (See row information) Yes - validated most recent copy scanned in chart (See row information)    Would patient like information on creating a medical advance directive? No - Patient declined        Current Medications (verified) Outpatient Encounter Medications as of 03/19/2022  Medication Sig   aspirin 81 MG tablet Take 81 mg by mouth daily.   Continuous Blood Gluc Sensor (FREESTYLE LIBRE 2 SENSOR) MISC REPLACE EVERY 14 DAYS   glucose blood (ONETOUCH VERIO) test strip TEST TWICE DAILY DX CODE E11.9   HYDROcodone-acetaminophen (NORCO/VICODIN) 5-325 MG tablet Take 1 tablet by mouth every 6 (six) hours as needed.   insulin lispro (HUMALOG KWIKPEN) 100 UNIT/ML KwikPen INJECT 8-10 UNITS SUBCUTANEOUS BEFORE EACH MEAL DAILY   Insulin Pen Needle (BD PEN NEEDLE NANO 2ND GEN) 32G X 4  MM MISC Use TID with insulin pens   Lancets (ONETOUCH ULTRASOFT) lancets Use to test blood sugar 2 times daily- Dx code E11.9   losartan (COZAAR) 25 MG tablet TAKE 1 TABLET(25 MG) BY MOUTH DAILY   methocarbamol (ROBAXIN) 500 MG tablet Take 1 tablet (500 mg total) by mouth every 8 (eight) hours as needed for muscle spasms.   simvastatin (ZOCOR) 40 MG tablet TAKE 1 TABLET(40 MG) BY MOUTH DAILY AT 6 PM   SYNJARDY XR 12.09-998 MG TB24 TAKE 2 TABLETS BY MOUTH DAILY   TRESIBA FLEXTOUCH 200 UNIT/ML FlexTouch Pen ADMINISTER 40 UNITS UNDER THE SKIN DAILY   Vitamin D, Ergocalciferol, (DRISDOL) 1.25 MG (50000 UNIT) CAPS capsule TAKE 1 CAPSULE BY MOUTH EVERY 7 DAYS (Patient not taking: Reported on 06/17/2021)   No facility-administered encounter medications on file as of 03/19/2022.    Allergies (verified) Patient has no known allergies.   History: Past Medical History:  Diagnosis Date   Diabetes mellitus without complication (HCC)    Essential hypertension 10/16/2015   History of oral surgery    Hyperlipidemia    OA (osteoarthritis) of hip 11/05/2015   Obese 10/16/2015   Smoking    Vitamin D deficiency 11/05/2015   Past Surgical History:  Procedure Laterality Date   CATARACT EXTRACTION W/PHACO  Left 08/12/2020   Procedure: CATARACT EXTRACTION PHACO AND INTRAOCULAR LENS PLACEMENT (IOC) LEFT DIABETIC PANOPTIX  TORIC LENS;  Surgeon: Nevada Crane, MD;  Location: [facility];  Service: Ophthalmology;  Laterality: Left;  3.75 0:30.1   CATARACT EXTRACTION W/PHACO Right 08/26/2020   Procedure: CATARACT EXTRACTION PHACO AND INTRAOCULAR LENS PLACEMENT (IOC) RIGHT DIABETIC PANOPTIX  LENS 2.82 00:26.1;  Surgeon: Nevada Crane, MD;  Location: [facility];  Service: Ophthalmology;  Laterality: Right;  Diabetic   Family History  Problem Relation Age of Onset   Diabetes Mother    Congestive Heart Failure Mother    Cancer Sister        breast   Cancer Sister        cervical   Heart  attack Brother    Social History   Socioeconomic History   Marital status: Widowed    Spouse name: Not on file   Number of children: Not on file   Years of education: Not on file   Highest education level: Not on file  Occupational History   Not on file  Tobacco Use   Smoking status: Every Day    Packs/day: 1.00    Years: 25.00    Total pack years: 25.00    Types: Cigarettes   Smokeless tobacco: Current    Types: Chew  Vaping Use   Vaping Use: Never used  Substance and Sexual Activity   Alcohol use: Not Currently    Alcohol/week: 0.0 standard drinks of alcohol   Drug use: No   Sexual activity: Never  Other Topics Concern   Not on file  Social History Narrative   Not on file   Social Determinants of Health   Financial Resource Strain: Low Risk  (03/19/2022)   Overall Financial Resource Strain (CARDIA)    Difficulty of Paying Living Expenses: Not hard at all  Food Insecurity: No Food Insecurity (03/19/2022)   Hunger Vital Sign    Worried About Running Out of Food in the Last Year: Never true    Ran Out of Food in the Last Year: Never true  Transportation Needs: No Transportation Needs (03/19/2022)   PRAPARE - Administrator, Civil Service (Medical): No    Lack of Transportation (Non-Medical): No  Physical Activity: Sufficiently Active (03/19/2022)   Exercise Vital Sign    Days of Exercise per Week: 7 days    Minutes of Exercise per Session: 60 min  Stress: No Stress Concern Present (03/19/2022)   [employer] of Occupational Health - Occupational Stress Questionnaire    Feeling of Stress : Not at all  Social Connections: Socially Isolated (03/19/2022)   Social Connection and Isolation Panel [NHANES]    Frequency of Communication with Friends and Family: More than three times a week    Frequency of Social Gatherings with Friends and Family: More than three times a week    Attends Religious Services: Never    Database administrator or Organizations: No     Attends Banker Meetings: Never    Marital Status: Widowed    Tobacco Counseling Ready to quit: Not Answered Counseling given: Not Answered   Clinical Intake:     Pain Score: 0-No pain           Diabetic? Type 2 DM         Activities of Daily Living    03/19/2022   10:05 AM 11/20/2021   10:51 AM  In your present state of health,  do you have any difficulty performing the following activities:  Hearing? 0 0  Vision? 0 0  Difficulty concentrating or making decisions? 0 0  Walking or climbing stairs? 0 0  Dressing or bathing? 0 0  Doing errands, shopping? 0 0    Patient Care Team: Lorrene Reid, PA-C as PCP - General Dingeldein, Remo Lipps, MD (Ophthalmology)  Indicate any recent Medical Services you may have received from other than [facility] providers in the past year (date may be approximate).     Assessment:   This is a routine wellness examination for [place].  Hearing/Vision screen No results found.  Dietary issues and exercise activities discussed: -A heart healthy diet low in fat and carbohydrates. Stays as active as possible.    Goals Addressed   None   Depression Screen    03/19/2022   10:04 AM 11/20/2021   10:51 AM 06/02/2021    8:11 AM 02/04/2021    1:56 PM 09/24/2020    8:54 AM 01/11/2020    4:42 PM 05/26/2019    9:17 AM  PHQ 2/9 Scores  PHQ - 2 Score 0 0 0 0 0 0 0  PHQ- 9 Score 0 0 0 0 0 0 1    Fall Risk    03/19/2022   10:04 AM 06/02/2021    8:10 AM 02/04/2021    1:53 PM 09/24/2020    8:54 AM 01/11/2020    4:42 PM  Fall Risk   Falls in the past year? 1 0 0 0 0  Number falls in past yr: 0 0 0 0   Injury with Fall? 0 0 0 0   Risk for fall due to :  No Fall Risks No Fall Risks No Fall Risks   Follow up  Falls evaluation completed Falls evaluation completed Falls evaluation completed Falls evaluation completed    [place]:  Any stairs in or around the home? Yes  If so, are there any without  handrails? No  Home free of loose throw rugs in walkways, pet beds, electrical cords, etc? Yes  Adequate lighting in your home to reduce risk of falls? Yes   ASSISTIVE DEVICES UTILIZED TO PREVENT FALLS:  Life alert? No  Use of a cane, walker or w/c? No  Grab bars in the bathroom? No  Shower chair or bench in shower? No  Elevated toilet seat or a handicapped toilet? Yes   TIMED UP AND GO:  Was the test performed? Yes .  Length of time to ambulate 10 feet: 10-15 sec.   Gait steady and fast without use of assistive device  Cognitive Function: normal      03/19/2022   10:03 AM 02/04/2021    1:37 PM 05/26/2019    9:15 AM 05/17/2017    8:28 AM  6CIT Screen  What Year? 0 points 0 points 0 points 0 points  What month? 0 points 0 points 0 points 0 points  What time? 0 points 0 points 0 points 0 points  Count back from 20 0 points 0 points 0 points 0 points  Months in reverse 0 points 0 points 0 points 0 points  Repeat phrase 0 points 0 points 0 points 0 points  Total Score 0 points 0 points 0 points 0 points    Immunizations Immunization History  Administered Date(s) Administered   Fluad Quad(high Dose 65+) 02/04/2021, 03/19/2022   Influenza, High Dose Seasonal PF 02/21/2016, 05/17/2017, 04/12/2018, 04/02/2020   Influenza-Unspecified 02/27/2010, 03/23/2011, 02/10/2012, 04/12/2013,  05/04/2014   PFIZER(Purple Top)SARS-COV-2 Vaccination 06/24/2019, 07/19/2019, 03/05/2020   Pneumococcal Conjugate-13 09/09/2016   Pneumococcal-Unspecified 05/04/2014   Tdap 01/03/2009, 06/13/2019   Zoster Recombinat (Shingrix) 06/04/2019   Zoster, Live 03/23/2011    TDAP status: Up to date  Flu Vaccine status: Completed at today's visit  Pneumococcal vaccine status: Due, Education has been provided regarding the importance of this vaccine. Advised may receive this vaccine at local pharmacy or Health Dept. Aware to provide a copy of the vaccination record if obtained from local pharmacy or Health  Dept. Verbalized acceptance and understanding.  Covid-19 vaccine status: Completed vaccines  Qualifies for Shingles Vaccine? No   Zostavax completed No   Shingrix Completed?: Yes  Screening Tests Health Maintenance  Topic Date Due   Hepatitis C Screening  Never done   COLONOSCOPY (Pts 45-6765yrs Insurance coverage will need to be confirmed)  Never done   Lung Cancer Screening  Never done   Pneumonia Vaccine 7365+ Years old (2 - PPSV23 or PCV20) 11/04/2016   Zoster Vaccines- Shingrix (2 of 2) 07/30/2019   COVID-19 Vaccine (4 - Pfizer series) 04/30/2020   OPHTHALMOLOGY EXAM  06/26/2021   FOOT EXAM  02/04/2022   Medicare Annual Wellness (AWV)  02/04/2022   HEMOGLOBIN A1C  06/20/2022   Diabetic kidney evaluation - GFR measurement  12/19/2022   Diabetic kidney evaluation - Urine ACR  12/19/2022   TETANUS/TDAP  06/12/2029   INFLUENZA VACCINE  Completed   HPV VACCINES  Aged Out    Health Maintenance  Health Maintenance Due  Topic Date Due   Hepatitis C Screening  Never done   COLONOSCOPY (Pts 45-5665yrs Insurance coverage will need to be confirmed)  Never done   Lung Cancer Screening  Never done   Pneumonia Vaccine 8665+ Years old (2 - PPSV23 or PCV20) 11/04/2016   Zoster Vaccines- Shingrix (2 of 2) 07/30/2019   COVID-19 Vaccine (4 - Pfizer series) 04/30/2020   OPHTHALMOLOGY EXAM  06/26/2021   FOOT EXAM  02/04/2022   Medicare Annual Wellness (AWV)  02/04/2022    Colorectal cancer screening: Referral to GI placed pt deferred.Marland Kitchen. Pt aware the office will call re: appt.  Lung Cancer Screening: (Low Dose CT Chest recommended if Age 51-80 years, 30 pack-year currently smoking OR have quit w/in 15years.) does not qualify.   Lung Cancer Screening Referral: n/a  Additional Screening:  Hepatitis C Screening: does qualify; Completed deferred.  Vision Screening: Recommended annual ophthalmology exams for early detection of glaucoma and other disorders of the eye. Is the patient up to date  with their annual eye exam?  Yes  Who is the provider or what is the name of the office in which the patient attends annual eye exams? [facility] If pt is not established with a provider, would they like to be referred to a provider to establish care? No .   Dental Screening: Recommended annual dental exams for proper oral hygiene  Community Resource Referral / Chronic Care Management: CRR required this visit?  No   CCM required this visit?  No      Plan:  -Patient is followed by endocrinology and wants to get his routine fasting labs 1 wk before his appointment 04/14/22. Advised to schedule lab visit. -Pending Vitamin D results, recommend to provide refills of vit D 50,000 units once a week if indicated, otherwise recommend OTC Vit D3 2000 units daily. -BP stable. -Medication hydrocodone-acetaminophen prescribed by ED for acute rib contusion. -Follow-up in 4 months for reg OV-  DM, HTN, HLD  I have personally reviewed and noted the following in the patient's chart:   Medical and social history Use of alcohol, tobacco or illicit drugs  Current medications and supplements including opioid prescriptions. Patient is not currently taking opioid prescriptions. Functional ability and status Nutritional status Physical activity Advanced directives List of other physicians Hospitalizations, surgeries, and ER visits in previous 12 months Vitals Screenings to include cognitive, depression, and falls Referrals and appointments  In addition, I have reviewed and discussed with patient certain preventive protocols, quality metrics, and best practice recommendations. A written personalized care plan for preventive services as well as general preventive health recommendations were provided to patient.     Mayer Masker, PA-C   03/19/2022   Nurse Notes: Face to face 25 minutes

## 2022-03-19 NOTE — Patient Instructions (Signed)
Preventive Care 65 Years and Older, Male Preventive care refers to lifestyle choices and visits with your health care provider that can promote health and wellness. Preventive care visits are also called wellness exams. What can I expect for my preventive care visit? Counseling During your preventive care visit, your health care provider may ask about your: Medical history, including: Past medical problems. Family medical history. History of falls. Current health, including: Emotional well-being. Home life and relationship well-being. Sexual activity. Memory and ability to understand (cognition). Lifestyle, including: Alcohol, nicotine or tobacco, and drug use. Access to firearms. Diet, exercise, and sleep habits. Work and work environment. Sunscreen use. Safety issues such as seatbelt and bike helmet use. Physical exam Your health care provider will check your: Height and weight. These may be used to calculate your BMI (body mass index). BMI is a measurement that tells if you are at a healthy weight. Waist circumference. This measures the distance around your waistline. This measurement also tells if you are at a healthy weight and may help predict your risk of certain diseases, such as type 2 diabetes and high blood pressure. Heart rate and blood pressure. Body temperature. Skin for abnormal spots. What immunizations do I need?  Vaccines are usually given at various ages, according to a schedule. Your health care provider will recommend vaccines for you based on your age, medical history, and lifestyle or other factors, such as travel or where you work. What tests do I need? Screening Your health care provider may recommend screening tests for certain conditions. This may include: Lipid and cholesterol levels. Diabetes screening. This is done by checking your blood sugar (glucose) after you have not eaten for a while (fasting). Hepatitis C test. Hepatitis B test. HIV (human  immunodeficiency virus) test. STI (sexually transmitted infection) testing, if you are at risk. Lung cancer screening. Colorectal cancer screening. Prostate cancer screening. Abdominal aortic aneurysm (AAA) screening. You may need this if you are a current or former smoker. Talk with your health care provider about your test results, treatment options, and if necessary, the need for more tests. Follow these instructions at home: Eating and drinking  Eat a diet that includes fresh fruits and vegetables, whole grains, lean protein, and low-fat dairy products. Limit your intake of foods with high amounts of sugar, saturated fats, and salt. Take vitamin and mineral supplements as recommended by your health care provider. Do not drink alcohol if your health care provider tells you not to drink. If you drink alcohol: Limit how much you have to 0-2 drinks a day. Know how much alcohol is in your drink. In the [place] one drink equals one 12 oz bottle of beer (355 mL), one 5 oz glass of wine (148 mL), or one 1 oz glass of hard liquor (44 mL). Lifestyle Brush your teeth every morning and night with fluoride toothpaste. Floss one time each day. Exercise for at least 30 minutes 5 or more days each week. Do not use any products that contain nicotine or tobacco. These products include cigarettes, chewing tobacco, and vaping devices, such as e-cigarettes. If you need help quitting, ask your health care provider. Do not use drugs. If you are sexually active, practice safe sex. Use a condom or other form of protection to prevent STIs. Take aspirin only as told by your health care provider. Make sure that you understand how much to take and what form to take. Work with your health care provider to find out whether it is safe   and beneficial for you to take aspirin daily. Ask your health care provider if you need to take a cholesterol-lowering medicine (statin). Find healthy ways to manage stress, such  as: Meditation, yoga, or listening to music. Journaling. Talking to a trusted person. Spending time with friends and family. Safety Always wear your seat belt while driving or riding in a vehicle. Do not drive: If you have been drinking alcohol. Do not ride with someone who has been drinking. When you are tired or distracted. While texting. If you have been using any mind-altering substances or drugs. Wear a helmet and other protective equipment during sports activities. If you have firearms in your house, make sure you follow all gun safety procedures. Minimize exposure to UV radiation to reduce your risk of skin cancer. What's next? Visit your health care provider once a year for an annual wellness visit. Ask your health care provider how often you should have your eyes and teeth checked. Stay up to date on all vaccines. This information is not intended to replace advice given to you by your health care provider. Make sure you discuss any questions you have with your health care provider. Document Revised: 10/30/2020 Document Reviewed: 10/30/2020 Elsevier Patient Education  2023 [employer] Inc[employer]  

## 2022-04-07 ENCOUNTER — Other Ambulatory Visit: Payer: HMO

## 2022-04-07 DIAGNOSIS — E781 Pure hyperglyceridemia: Secondary | ICD-10-CM

## 2022-04-07 DIAGNOSIS — I152 Hypertension secondary to endocrine disorders: Secondary | ICD-10-CM

## 2022-04-07 DIAGNOSIS — E119 Type 2 diabetes mellitus without complications: Secondary | ICD-10-CM

## 2022-04-07 DIAGNOSIS — Z0000 Encounter for general adult medical examination without abnormal findings: Secondary | ICD-10-CM

## 2022-04-08 LAB — LIPID PANEL
Chol/HDL Ratio: 3.8 ratio (ref 0.0–5.0)
Cholesterol, Total: 126 mg/dL (ref 100–199)
HDL: 33 mg/dL — ABNORMAL LOW (ref >39)
LDL Chol Calc (NIH): 70 mg/dL (ref 0–99)
Triglycerides: 125 mg/dL (ref 0–149)
VLDL Cholesterol Cal: 23 mg/dL (ref 5–40)

## 2022-04-08 LAB — CBC WITH DIFFERENTIAL/PLATELET
Basophils Absolute: 0.1 10*3/uL (ref 0.0–0.2)
Basos: 1 %
EOS (ABSOLUTE): 0.5 10*3/uL — ABNORMAL HIGH (ref 0.0–0.4)
Eos: 6 %
Hematocrit: 53.5 % — ABNORMAL HIGH (ref 37.5–51.0)
Hemoglobin: 17.7 g/dL (ref 13.0–17.7)
Immature Grans (Abs): 0 10*3/uL (ref 0.0–0.1)
Immature Granulocytes: 0 %
Lymphocytes Absolute: 3 10*3/uL (ref 0.7–3.1)
Lymphs: 36 %
MCH: 30.9 pg (ref 26.6–33.0)
MCHC: 33.1 g/dL (ref 31.5–35.7)
MCV: 93 fL (ref 79–97)
Monocytes Absolute: 0.7 10*3/uL (ref 0.1–0.9)
Monocytes: 8 %
Neutrophils Absolute: 4.1 10*3/uL (ref 1.4–7.0)
Neutrophils: 49 %
Platelets: 315 10*3/uL (ref 150–450)
RBC: 5.73 x10E6/uL (ref 4.14–5.80)
RDW: 12.6 % (ref 11.6–15.4)
WBC: 8.3 10*3/uL (ref 3.4–10.8)

## 2022-04-08 LAB — COMPREHENSIVE METABOLIC PANEL
ALT: 19 IU/L (ref 0–44)
AST: 13 IU/L (ref 0–40)
Albumin/Globulin Ratio: 1.5 (ref 1.2–2.2)
Albumin: 4.3 g/dL (ref 3.8–4.8)
Alkaline Phosphatase: 75 IU/L (ref 44–121)
BUN/Creatinine Ratio: 19 (ref 10–24)
BUN: 16 mg/dL (ref 8–27)
Bilirubin Total: 0.3 mg/dL (ref 0.0–1.2)
CO2: 25 mmol/L (ref 20–29)
Calcium: 9.1 mg/dL (ref 8.6–10.2)
Chloride: 98 mmol/L (ref 96–106)
Creatinine, Ser: 0.83 mg/dL (ref 0.76–1.27)
Globulin, Total: 2.9 g/dL (ref 1.5–4.5)
Glucose: 133 mg/dL — ABNORMAL HIGH (ref 70–99)
Potassium: 4.3 mmol/L (ref 3.5–5.2)
Sodium: 135 mmol/L (ref 134–144)
Total Protein: 7.2 g/dL (ref 6.0–8.5)
eGFR: 92 mL/min/{1.73_m2} (ref >59)

## 2022-04-08 LAB — HEMOGLOBIN A1C
Est. average glucose Bld gHb Est-mCnc: 146 mg/dL
Hgb A1c MFr Bld: 6.7 % — ABNORMAL HIGH (ref 4.8–5.6)

## 2022-04-12 NOTE — Progress Notes (Signed)
Please let the patient know that, in general, his labs look ok. HgbA1c was 6.7 which has improved a little since last check. Cholesterol and other labs are stable. No changes need to be made to medications.  Thanks so much.   -HB

## 2022-04-14 ENCOUNTER — Ambulatory Visit: Payer: HMO | Admitting: Endocrinology

## 2022-04-24 ENCOUNTER — Ambulatory Visit (INDEPENDENT_AMBULATORY_CARE_PROVIDER_SITE_OTHER): Payer: PPO | Admitting: Endocrinology

## 2022-04-24 ENCOUNTER — Encounter: Payer: Self-pay | Admitting: Endocrinology

## 2022-04-24 VITALS — BP 138/82 | HR 87 | Ht 70.5 in | Wt 215.2 lb

## 2022-04-24 DIAGNOSIS — E782 Mixed hyperlipidemia: Secondary | ICD-10-CM

## 2022-04-24 DIAGNOSIS — E1129 Type 2 diabetes mellitus with other diabetic kidney complication: Secondary | ICD-10-CM | POA: Diagnosis not present

## 2022-04-24 DIAGNOSIS — I10 Essential (primary) hypertension: Secondary | ICD-10-CM | POA: Diagnosis not present

## 2022-04-24 DIAGNOSIS — R809 Proteinuria, unspecified: Secondary | ICD-10-CM

## 2022-04-24 DIAGNOSIS — Z794 Long term (current) use of insulin: Secondary | ICD-10-CM

## 2022-04-24 DIAGNOSIS — E1165 Type 2 diabetes mellitus with hyperglycemia: Secondary | ICD-10-CM

## 2022-04-24 NOTE — Progress Notes (Signed)
patient ID: Jose Munoz, male   DOB: Dec 14, 1950, 71 y.o.   MRN: [id number]           Reason for Appointment:  Follow-up for Type 2 Diabetes   History of Present Illness:          Date of diagnosis of type 2 diabetes mellitus: 1998       Background history:   He thinks his diabetes was relatively easier to control and the onset even though he had initial symptoms of thirst and weight loss He had done very well with his diet and lost a significant amount of weight and was approved to control his diabetes without medications for a few years Subsequently he started with oral medications including metformin, Actos and Amaryl He thinks his blood sugars were well controlled for a few years initially but started getting higher progressively since 2016. Because of poor control he was started on basal bolus insulin regimen on his initial consultation, highest A1c was 11.9 in 01/2016  Recent history:    INSULIN regimen: TRESIBA 42/44 units daily at bedtime, Novolog 0-10 at lunch, 10 units at dinner  Non-insulin hypoglycemic drugs the patient is taking are: Synjardy XR 12.09/998, 2 tablets  Current management, blood sugar patterns and problems identified: His A1c is back down to 6.7 He does monitor blood sugars fairly consistently now with 87% active CGM time Still does not appear to understand and blood sugar targets after meals He is still not taking more than 10 to 12 units for high carbohydrate meals like pasta and not achieving target  Although he is doing better with taking his insulin with him for lunch when he is eating out this is not consistent  Does not always eat 3 meals a day Because of hip and back pain he is not able to do much formal exercise such as on the treadmill, less playing golf and winter Overnight blood sugars are somewhat variable but probably a little higher than target although the lowest readings overnight are 128 average Weight is going up likely from lack of  exercise He is concerned about the cost of Synjardy in the donut hole          Side effects from medications have been: None  CONTINUOUS GLUCOSE MONITORING RECORD INTERPRETATION    Dates of Recording: Last 2 weeks  Sensor description: Libre 2  Glycemic patterns summary: Blood sugars are on an average within the target range at all times with highest readings in the afternoons and late evenings and lowest readings around 5 AM, no hypoglycemia  Hyperglycemic episodes are occurring periodically in the early afternoon and occasionally late evenings and only rarely late morning   Hypoglycemic episodes have not been documented  Overnight periods: Glucose is nearly 180 average at midnight and then progressively decreasing until about 5 AM and then slowly rising without hypoglycemia  Preprandial periods: Blood sugars are in the low 100s at breakfast but relatively higher at lunch, back again to 136 at dinner  Postprandial periods:   After breakfast:   Blood sugars are usually not rising excessively with some variability   After lunch:   Blood sugars fluctuate more significantly he is not at times are in the low 200 range with average as below Similar patterns are seen after dinner  Results statistics:  CGM use % of time 87  2-week average/GV 150  Time in range      79  %  % Time Above 180 21  % Time  above 250   % Time Below 70 0     PRE-MEAL Fasting Lunch Dinner Bedtime Overall  Glucose range:       Averages: 137 159 136     POST-MEAL PC Breakfast PC Lunch PC Dinner  Glucose range:     Averages:  171 170   Previously:    CGM use % of time   Average and GV 149/20  Time in range       86 %  % Time Above 180 14  % Time above 250   % Time Below target 0    PRE-MEAL 4-6 AM Lunch Dinner Bedtime Overall  Glucose range:       Mean/median: 128       POST-MEAL PC Breakfast PC Lunch PC Dinner  Glucose range:     Mean/median: 155 160 175      Self-care: The diet that  the patient has been following is: tries to limit sweets and drinks with sugar .      Typical meal intake: Breakfast is egg, toast occasionally, sometimes skipped or; Lunch is a sandwich, has various snacks    Dinner 5-7 P.m.            Dietician visit, most recent: 06/2016 CDE visit: 11/17              Weight history:  Wt Readings from Last 3 Encounters:  04/24/22 215 lb 3.2 oz (97.6 kg)  03/19/22 210 lb 14.4 oz (95.7 kg)  12/29/21 208 lb (94.3 kg)    Glycemic control:   Lab Results  Component Value Date   HGBA1C 6.7 (H) 04/07/2022   HGBA1C 6.9 (H) 12/18/2021   HGBA1C 7.6 (H) 09/11/2021   Lab Results  Component Value Date   MICROALBUR 80 09/24/2020   LDLCALC 70 04/07/2022   CREATININE 0.83 04/07/2022   Lab Results  Component Value Date   MICRALBCREAT 327 (H) 12/18/2021       Allergies as of 04/24/2022   No Known Allergies      Medication List        Accurate as of April 24, 2022  4:12 PM. If you have any questions, ask your nurse or doctor.          aspirin 81 MG tablet Take 81 mg by mouth daily.   BD Pen Needle Nano 2nd Gen 32G X 4 MM Misc Generic drug: Insulin Pen Needle Use TID with insulin pens   FreeStyle Libre 2 Sensor Misc REPLACE EVERY 14 DAYS   HYDROcodone-acetaminophen 5-325 MG tablet Commonly known as: NORCO/VICODIN Take 1 tablet by mouth every 6 (six) hours as needed.   insulin lispro 100 UNIT/ML KwikPen Commonly known as: HumaLOG KwikPen INJECT 8-10 UNITS SUBCUTANEOUS BEFORE EACH MEAL DAILY   losartan 25 MG tablet Commonly known as: COZAAR TAKE 1 TABLET(25 MG) BY MOUTH DAILY   methocarbamol 500 MG tablet Commonly known as: ROBAXIN Take 1 tablet (500 mg total) by mouth every 8 (eight) hours as needed for muscle spasms.   onetouch ultrasoft lancets Use to test blood sugar 2 times daily- Dx code E11.9   OneTouch Verio test strip Generic drug: glucose blood TEST TWICE DAILY DX CODE E11.9   simvastatin 40 MG  tablet Commonly known as: ZOCOR TAKE 1 TABLET(40 MG) BY MOUTH DAILY AT 6 PM   Synjardy XR 12.09-998 MG Tb24 Generic drug: Empagliflozin-metFORMIN HCl ER TAKE 2 TABLETS BY MOUTH DAILY   Tresiba FlexTouch 200 UNIT/ML FlexTouch Pen Generic drug: insulin degludec  ADMINISTER 40 UNITS UNDER THE SKIN DAILY   Vitamin D (Ergocalciferol) 1.25 MG (50000 UNIT) Caps capsule Commonly known as: DRISDOL TAKE 1 CAPSULE BY MOUTH EVERY 7 DAYS        Allergies: No Known Allergies  Past Medical History:  Diagnosis Date   Diabetes mellitus without complication ([place])    Essential hypertension 10/16/2015   History of oral surgery    Hyperlipidemia    OA (osteoarthritis) of hip 11/05/2015   Obese 10/16/2015   Smoking    Vitamin D deficiency 11/05/2015    Past Surgical History:  Procedure Laterality Date   CATARACT EXTRACTION W/PHACO Left 08/12/2020   Procedure: CATARACT EXTRACTION PHACO AND INTRAOCULAR LENS PLACEMENT ([place]) LEFT DIABETIC [place];  Surgeon: Eulogio Bear, MD;  Location: [facility];  Service: Ophthalmology;  Laterality: Left;  3.75 0:30.1   CATARACT EXTRACTION W/PHACO Right 08/26/2020   Procedure: CATARACT EXTRACTION PHACO AND INTRAOCULAR LENS PLACEMENT ([facility]) RIGHT DIABETIC [place]  LENS 2.82 00:26.1;  Surgeon: Eulogio Bear, MD;  Location: [facility];  Service: Ophthalmology;  Laterality: Right;  Diabetic    Family History  Problem Relation Age of Onset   Diabetes Mother    Congestive Heart Failure Mother    Cancer Sister        breast   Cancer Sister        cervical   Heart attack Brother     Social History:  reports that he has been smoking cigarettes. He has a 25.00 pack-year smoking history. His smokeless tobacco use includes chew. He reports that he does not currently use alcohol. He reports that he does not use drugs.   Review of Systems   Lipid history: Has excellent LDL levels with taking simvastatin 40 mg prescribed by his  PCP HDL low, again triglycerides are normal    Lab Results  Component Value Date   CHOL 126 04/07/2022   HDL 33 (L) 04/07/2022   LDLCALC 70 04/07/2022   TRIG 125 04/07/2022   CHOLHDL 3.8 04/07/2022           Hypertension:  is being treated with losartan 25 mg Previously had mild hyperkalemia with lisinopril  Also on [place] BP 123/78 recently, higher in the office  BP Readings from Last 3 Encounters:  04/24/22 138/82  03/19/22 132/75  12/29/21 (!) 168/70   Lab Results  Component Value Date   K 4.3 04/07/2022    Has eye exams regularly, was told to have mild background retinopathy, last exam 5/23  Most recent foot exam: 9/22   LABS:  No visits with results within 1 Week(s) from this visit.  Latest known visit with results is:  Lab on 04/07/2022  Component Date Value Ref Range Status   Cholesterol, Total 04/07/2022 126  100 - 199 mg/dL Final   Triglycerides 04/07/2022 125  0 - 149 mg/dL Final   HDL 04/07/2022 33 (L)  >39 mg/dL Final   VLDL Cholesterol Cal 04/07/2022 23  5 - 40 mg/dL Final   LDL Chol Calc (NIH) 04/07/2022 70  0 - 99 mg/dL Final   Chol/HDL Ratio 04/07/2022 3.8  0.0 - 5.0 ratio Final   Comment:                                   T. Chol/HDL Ratio  Men  Women                               1/2 Avg.Risk  3.4    3.3                                   Avg.Risk  5.0    4.4                                2X Avg.Risk  9.6    7.1                                3X Avg.Risk 23.4   11.0    WBC 04/07/2022 8.3  3.4 - 10.8 x10E3/uL Final   RBC 04/07/2022 5.73  4.14 - 5.80 x10E6/uL Final   Hemoglobin 04/07/2022 17.7  13.0 - 17.7 g/dL Final   Hematocrit 04/07/2022 53.5 (H)  37.5 - 51.0 % Final   MCV 04/07/2022 93  79 - 97 fL Final   MCH 04/07/2022 30.9  26.6 - 33.0 pg Final   MCHC 04/07/2022 33.1  31.5 - 35.7 g/dL Final   RDW 04/07/2022 12.6  11.6 - 15.4 % Final   Platelets 04/07/2022 315  150 - 450 x10E3/uL Final    Neutrophils 04/07/2022 49  Not Estab. % Final   Lymphs 04/07/2022 36  Not Estab. % Final   Monocytes 04/07/2022 8  Not Estab. % Final   Eos 04/07/2022 6  Not Estab. % Final   Basos 04/07/2022 1  Not Estab. % Final   Neutrophils Absolute 04/07/2022 4.1  1.4 - 7.0 x10E3/uL Final   Lymphocytes Absolute 04/07/2022 3.0  0.7 - 3.1 x10E3/uL Final   Monocytes Absolute 04/07/2022 0.7  0.1 - 0.9 x10E3/uL Final   EOS (ABSOLUTE) 04/07/2022 0.5 (H)  0.0 - 0.4 x10E3/uL Final   Basophils Absolute 04/07/2022 0.1  0.0 - 0.2 x10E3/uL Final   Immature Granulocytes 04/07/2022 0  Not Estab. % Final   Immature Grans (Abs) 04/07/2022 0.0  0.0 - 0.1 x10E3/uL Final   Glucose 04/07/2022 133 (H)  70 - 99 mg/dL Final   BUN 04/07/2022 16  8 - 27 mg/dL Final   Creatinine, Ser 04/07/2022 0.83  0.76 - 1.27 mg/dL Final   eGFR 04/07/2022 92  >59 mL/min/1.73 Final   BUN/Creatinine Ratio 04/07/2022 19  10 - 24 Final   Sodium 04/07/2022 135  134 - 144 mmol/L Final   Potassium 04/07/2022 4.3  3.5 - 5.2 mmol/L Final   Chloride 04/07/2022 98  96 - 106 mmol/L Final   CO2 04/07/2022 25  20 - 29 mmol/L Final   Calcium 04/07/2022 9.1  8.6 - 10.2 mg/dL Final   Total Protein 04/07/2022 7.2  6.0 - 8.5 g/dL Final   Albumin 04/07/2022 4.3  3.8 - 4.8 g/dL Final   Globulin, Total 04/07/2022 2.9  1.5 - 4.5 g/dL Final   Albumin/Globulin Ratio 04/07/2022 1.5  1.2 - 2.2 Final   Bilirubin Total 04/07/2022 0.3  0.0 - 1.2 mg/dL Final   Alkaline Phosphatase 04/07/2022 75  44 - 121 IU/L Final   AST 04/07/2022 13  0 - 40 IU/L Final   ALT 04/07/2022 19  0 - 44 IU/L Final   Hgb  A1c MFr Bld 04/07/2022 6.7 (H)  4.8 - 5.6 % Final   Comment:          Prediabetes: 5.7 - 6.4          Diabetes: >6.4          Glycemic control for adults with diabetes: <7.0    Est. average glucose Bld gHb Est-m* 04/07/2022 146  mg/dL Final    Physical Examination:  BP 138/82   Pulse 87   Ht 5' 10.5" (1.791 m)   Wt 215 lb 3.2 oz (97.6 kg)   SpO2 95%   BMI  30.44 kg/m      ASSESSMENT:  Diabetes type 2, insulin-dependent  See history of present illness for detailed discussion of current diabetes management, blood sugar patterns and problems identified  His A1c is 6.7  He is on Synjardy XR twice daily and basal bolus insulin   A1c is improving  Again has periodic postprandial hyperglycemia as discussed in detail above   Hypertension: Fairly well-controlled  Microalbuminuria: This will need to will need to be rechecked on the next visit, may consider increasing losartan Also [place] Clamp may be a option on the cost will likely be a factor as well as potassium level Also may improve with better diabetes control  Renal function normal  LIPIDS: Well-controlled  PLAN:   He will take as much is 14 units of coverage for meals with high carbohydrate content Again discussed postprandial target of under 180 He will try to take Humalog consistently before starting to eat and try to take it with him when going out If morning sugars are consistently over 130 will go up 2 units on the [place] Consider libre 3 when he is eligible for upgrade  There are no Patient Instructions on file for this visit.     Elayne Snare 04/24/2022, 4:12 PM   Note: This office note was prepared with Dragon voice recognition system technology. Any transcriptional errors that result from this process are unintentional.

## 2022-04-28 ENCOUNTER — Encounter: Payer: Self-pay | Admitting: Endocrinology

## 2022-05-20 ENCOUNTER — Other Ambulatory Visit: Payer: Self-pay | Admitting: Endocrinology

## 2022-05-20 DIAGNOSIS — I10 Essential (primary) hypertension: Secondary | ICD-10-CM

## 2022-05-23 ENCOUNTER — Other Ambulatory Visit: Payer: Self-pay | Admitting: Endocrinology

## 2022-06-15 ENCOUNTER — Other Ambulatory Visit: Payer: Self-pay

## 2022-06-15 DIAGNOSIS — E781 Pure hyperglyceridemia: Secondary | ICD-10-CM

## 2022-06-15 DIAGNOSIS — E1169 Type 2 diabetes mellitus with other specified complication: Secondary | ICD-10-CM

## 2022-06-15 MED ORDER — SIMVASTATIN 40 MG PO TABS: ORAL_TABLET | ORAL | 0 refills | Status: DC

## 2022-06-24 ENCOUNTER — Other Ambulatory Visit: Payer: Self-pay | Admitting: Endocrinology

## 2022-07-08 ENCOUNTER — Other Ambulatory Visit: Payer: Self-pay | Admitting: Endocrinology

## 2022-07-19 NOTE — Progress Notes (Unsigned)
Established patient visit   Patient: Jose Munoz   DOB: [birth year]   72 y.o. Male  MRN: [id number] Visit Date: 07/20/2022   No chief complaint on file.  Subjective    HPI  Follow up  -hypertension  --generally well controlled -type 2 diabetes  --improved control  --due for check HgbA1c today  --last check HgbA1c 03/2022 - 6.7 -hyperlipidemia --most recent lipid panel normal with normal LFTs -?colon cancer screening.     Medications: Outpatient Medications Prior to Visit  Medication Sig   aspirin 81 MG tablet Take 81 mg by mouth daily.   Continuous Blood Gluc Sensor (FREESTYLE LIBRE 2 SENSOR) MISC REPLACE EVERY 14 DAYS   glucose blood (ONETOUCH VERIO) test strip TEST TWICE DAILY DX CODE E11.9   HYDROcodone-acetaminophen (NORCO/VICODIN) 5-325 MG tablet Take 1 tablet by mouth every 6 (six) hours as needed. (Patient not taking: Reported on 04/24/2022)   insulin lispro (HUMALOG KWIKPEN) 100 UNIT/ML KwikPen INJECT 8-10 UNITS SUBCUTANEOUS BEFORE EACH MEAL DAILY   Insulin Pen Needle (BD PEN NEEDLE NANO 2ND GEN) 32G X 4 MM MISC Use TID with insulin pens   Lancets (ONETOUCH ULTRASOFT) lancets Use to test blood sugar 2 times daily- Dx code E11.9   losartan (COZAAR) 25 MG tablet TAKE 1 TABLET(25 MG) BY MOUTH DAILY   methocarbamol (ROBAXIN) 500 MG tablet Take 1 tablet (500 mg total) by mouth every 8 (eight) hours as needed for muscle spasms. (Patient not taking: Reported on 04/24/2022)   simvastatin (ZOCOR) 40 MG tablet TAKE 1 TABLET(40 MG) BY MOUTH DAILY AT 6 PM   SYNJARDY XR 12.09-998 MG TB24 TAKE 2 TABLETS BY MOUTH DAILY   TRESIBA FLEXTOUCH 200 UNIT/ML FlexTouch Pen ADMINISTER 40 UNITS UNDER THE SKIN DAILY   Vitamin D, Ergocalciferol, (DRISDOL) 1.25 MG (50000 UNIT) CAPS capsule TAKE 1 CAPSULE BY MOUTH EVERY 7 DAYS (Patient not taking: Reported on 06/17/2021)   No facility-administered medications prior to visit.    Review of Systems See HPI    Last CBC Lab Results  Component  Value Date   WBC 8.3 04/07/2022   HGB 17.7 04/07/2022   HCT 53.5 (H) 04/07/2022   MCV 93 04/07/2022   MCH 30.9 04/07/2022   RDW 12.6 04/07/2022   PLT 315 [id number]   Last metabolic panel Lab Results  Component Value Date   GLUCOSE 133 (H) 04/07/2022   NA 135 04/07/2022   K 4.3 04/07/2022   CL 98 04/07/2022   CO2 25 04/07/2022   BUN 16 04/07/2022   CREATININE 0.83 04/07/2022   EGFR 92 04/07/2022   CALCIUM 9.1 04/07/2022   PROT 7.2 04/07/2022   ALBUMIN 4.3 04/07/2022   LABGLOB 2.9 04/07/2022   AGRATIO 1.5 04/07/2022   BILITOT 0.3 04/07/2022   ALKPHOS 75 04/07/2022   AST 13 04/07/2022   ALT 19 04/07/2022   Last lipids Lab Results  Component Value Date   CHOL 126 04/07/2022   HDL 33 (L) 04/07/2022   LDLCALC 70 04/07/2022   TRIG 125 04/07/2022   CHOLHDL 3.8 04/07/2022   Last hemoglobin A1c Lab Results  Component Value Date   HGBA1C 6.7 (H) 04/07/2022   Last thyroid functions Lab Results  Component Value Date   TSH 1.540 02/06/2021   Last vitamin D Lab Results  Component Value Date   VD25OH 37.8 09/24/2020       Objective    There were no vitals filed for this visit. There is no height or weight on file to calculate BMI.  BP Readings from Last 3 Encounters:  04/24/22 138/82  03/19/22 132/75  12/29/21 (Abnormal) 168/70    Wt Readings from Last 3 Encounters:  04/24/22 215 lb 3.2 oz (97.6 kg)  03/19/22 210 lb 14.4 oz (95.7 kg)  12/29/21 208 lb (94.3 kg)    Physical Exam  ***  No results found for any visits on 07/20/22.  Assessment & Plan     Problem List Items Addressed This Visit   None    No follow-ups on file.         Ronnell Freshwater, NP  [facility] [phone] (phone) [phone] (fax)  [place]

## 2022-07-20 ENCOUNTER — Encounter: Payer: Self-pay | Admitting: Nurse Practitioner

## 2022-07-20 ENCOUNTER — Ambulatory Visit (INDEPENDENT_AMBULATORY_CARE_PROVIDER_SITE_OTHER): Payer: PPO | Admitting: Nurse Practitioner

## 2022-07-20 VITALS — BP 125/76 | HR 85 | Ht 70.5 in | Wt 213.2 lb

## 2022-07-20 DIAGNOSIS — E1169 Type 2 diabetes mellitus with other specified complication: Secondary | ICD-10-CM

## 2022-07-20 DIAGNOSIS — E782 Mixed hyperlipidemia: Secondary | ICD-10-CM | POA: Diagnosis not present

## 2022-07-20 DIAGNOSIS — E119 Type 2 diabetes mellitus without complications: Secondary | ICD-10-CM | POA: Diagnosis not present

## 2022-07-20 DIAGNOSIS — E1159 Type 2 diabetes mellitus with other circulatory complications: Secondary | ICD-10-CM | POA: Diagnosis not present

## 2022-07-20 DIAGNOSIS — I152 Hypertension secondary to endocrine disorders: Secondary | ICD-10-CM | POA: Diagnosis not present

## 2022-07-20 DIAGNOSIS — F17200 Nicotine dependence, unspecified, uncomplicated: Secondary | ICD-10-CM

## 2022-07-20 LAB — POCT GLYCOSYLATED HEMOGLOBIN (HGB A1C): HbA1c POC (<> result, manual entry): 6.6 % (ref 4.0–5.6)

## 2022-08-21 ENCOUNTER — Other Ambulatory Visit: Payer: PPO

## 2022-08-21 DIAGNOSIS — E1165 Type 2 diabetes mellitus with hyperglycemia: Secondary | ICD-10-CM

## 2022-08-21 DIAGNOSIS — R809 Proteinuria, unspecified: Secondary | ICD-10-CM | POA: Diagnosis not present

## 2022-08-21 DIAGNOSIS — Z794 Long term (current) use of insulin: Secondary | ICD-10-CM | POA: Diagnosis not present

## 2022-08-21 DIAGNOSIS — E1129 Type 2 diabetes mellitus with other diabetic kidney complication: Secondary | ICD-10-CM | POA: Diagnosis not present

## 2022-08-22 ENCOUNTER — Other Ambulatory Visit: Payer: Self-pay | Admitting: Endocrinology

## 2022-08-22 DIAGNOSIS — E1165 Type 2 diabetes mellitus with hyperglycemia: Secondary | ICD-10-CM

## 2022-08-23 LAB — MICROALBUMIN / CREATININE URINE RATIO
Creatinine, Urine: 57.1 mg/dL
Microalb/Creat Ratio: 516 mg/g creat — ABNORMAL HIGH (ref 0–29)
Microalbumin, Urine: 294.4 ug/mL

## 2022-08-23 LAB — URINALYSIS, ROUTINE W REFLEX MICROSCOPIC
Bilirubin, UA: NEGATIVE
Ketones, UA: NEGATIVE
Leukocytes,UA: NEGATIVE
Nitrite, UA: NEGATIVE
RBC, UA: NEGATIVE
Specific Gravity, UA: >=1.03 — AB (ref 1.005–1.030)
Urobilinogen, Ur: 0.2 mg/dL (ref 0.2–1.0)
pH, UA: 5.5 (ref 5.0–7.5)

## 2022-08-23 LAB — MICROSCOPIC EXAMINATION
Bacteria, UA: NONE SEEN
Casts: NONE SEEN /lpf
Epithelial Cells (non renal): NONE SEEN /hpf (ref 0–10)
RBC, Urine: NONE SEEN /hpf (ref 0–2)
WBC, UA: NONE SEEN /hpf (ref 0–5)

## 2022-08-23 LAB — HEMOGLOBIN A1C
Est. average glucose Bld gHb Est-mCnc: 157 mg/dL
Hgb A1c MFr Bld: 7.1 % — ABNORMAL HIGH (ref 4.8–5.6)

## 2022-08-26 ENCOUNTER — Ambulatory Visit (INDEPENDENT_AMBULATORY_CARE_PROVIDER_SITE_OTHER): Payer: PPO | Admitting: Endocrinology

## 2022-08-26 ENCOUNTER — Encounter: Payer: Self-pay | Admitting: Endocrinology

## 2022-08-26 VITALS — BP 124/86 | HR 86 | Ht 70.5 in | Wt 216.0 lb

## 2022-08-26 DIAGNOSIS — I10 Essential (primary) hypertension: Secondary | ICD-10-CM | POA: Diagnosis not present

## 2022-08-26 DIAGNOSIS — E1165 Type 2 diabetes mellitus with hyperglycemia: Secondary | ICD-10-CM | POA: Diagnosis not present

## 2022-08-26 DIAGNOSIS — R809 Proteinuria, unspecified: Secondary | ICD-10-CM | POA: Diagnosis not present

## 2022-08-26 DIAGNOSIS — E1129 Type 2 diabetes mellitus with other diabetic kidney complication: Secondary | ICD-10-CM | POA: Diagnosis not present

## 2022-08-26 DIAGNOSIS — Z794 Long term (current) use of insulin: Secondary | ICD-10-CM

## 2022-08-26 LAB — COMPREHENSIVE METABOLIC PANEL
ALT: 22 U/L (ref 0–53)
AST: 17 U/L (ref 0–37)
Albumin: 4.5 g/dL (ref 3.5–5.2)
Alkaline Phosphatase: 66 U/L (ref 39–117)
BUN: 18 mg/dL (ref 6–23)
CO2: 27 mEq/L (ref 19–32)
Calcium: 9.5 mg/dL (ref 8.4–10.5)
Chloride: 100 mEq/L (ref 96–112)
Creatinine, Ser: 0.76 mg/dL (ref 0.40–1.50)
GFR: 88.76 mL/min (ref >60.00)
Glucose, Bld: 125 mg/dL — ABNORMAL HIGH (ref 70–99)
Potassium: 4.3 mEq/L (ref 3.5–5.1)
Sodium: 136 mEq/L (ref 135–145)
Total Bilirubin: 0.5 mg/dL (ref 0.2–1.2)
Total Protein: 8.1 g/dL (ref 6.0–8.3)

## 2022-08-26 LAB — MICROALBUMIN / CREATININE URINE RATIO
Creatinine,U: 60 mg/dL
Microalb Creat Ratio: 74.8 mg/g — ABNORMAL HIGH (ref 0.0–30.0)
Microalb, Ur: 44.9 mg/dL — ABNORMAL HIGH (ref 0.0–1.9)

## 2022-08-26 NOTE — Progress Notes (Signed)
patient ID: Jose Munoz, male   DOB: 09/16/1950, 72 y.o.   MRN: [id number]           Reason for Appointment:  Follow-up for Type 2 Diabetes   History of Present Illness:          Date of diagnosis of type 2 diabetes mellitus: 1998       Background history:   He thinks his diabetes was relatively easier to control and the onset even though he had initial symptoms of thirst and weight loss He had done very well with his diet and lost a significant amount of weight and was approved to control his diabetes without medications for a few years Subsequently he started with oral medications including metformin, Actos and Amaryl He thinks his blood sugars were well controlled for a few years initially but started getting higher progressively since 2016. Because of poor control he was started on basal bolus insulin regimen on his initial consultation, highest A1c was 11.9 in 01/2016  Recent history:    INSULIN regimen: TRESIBA 42/44 units daily at bedtime, Novolog 0-10 at lunch, 10-12 units at dinner  Non-insulin hypoglycemic drugs the patient is taking are: Synjardy XR 12.09/998, 2 tablets  Current management, blood sugar patterns and problems identified: His A1c is 7.1 but his recent GMI is 6.6 He is trying to be better with taking insulin before meals but occasionally will take an inadequate amount of insulin Occasionally may forget insulin when he is eating out He was told to take as much is 14 units at suppertime for higher carbohydrate meals such as rice and pasta but he is not comfortable to bring extra doses Overnight blood sugars are excellent and rarely low normal However he will sometimes takes 44 units if he is eating more carbohydrate in the evening He is having difficulty with exercise but weight is overall about the same Usually not eating breakfast but may have some dawn phenomenon rise in blood sugar with coffee         Side effects from medications have been:  None  CONTINUOUS GLUCOSE MONITORING RECORD INTERPRETATION    Dates of Recording: Last 2 weeks  Sensor description: Libre 2  Glycemic patterns summary: Blood sugars are on an average during the day are between 140-150 overnight as low as 113 Sporadic postprandial hyperglycemia present in the afternoons and evenings  Hyperglycemic episodes are occurring mostly in the second week and either at lunch or dinner time but no overall consistent pattern  Hypoglycemic episodes have occurred except for rarely low normal readings around 2 AM  Overnight periods: Glucose is very well-controlled with low 100+ range with some rise early morning  Preprandial periods: Blood sugars are in the low 100s at breakfast but relatively higher at lunch, back again to 136 at dinner  Postprandial periods:   After morning call:   Blood sugars rising modestly  After lunch:   Blood sugars are inconsistently higher but on an average blood sugar not rising much blood sugars may rise more often in the late afternoon Sporadic high readings at night and after dinnertime above the target range  Results statistics:  CGM use % of time 78  2-week average/GV 139/27  Time in range 86     %  % Time Above 180 13+1  % Time above 250   % Time Below 70    Previously:  CGM use % of time 87  2-week average/GV 150  Time in range  79  %  % Time Above 180 21  % Time above 250   % Time Below 70 0     PRE-MEAL Fasting Lunch Dinner Bedtime Overall  Glucose range:       Averages: 137 159 136     POST-MEAL PC Breakfast PC Lunch PC Dinner  Glucose range:     Averages:  171 170      Self-care: The diet that the patient has been following is: tries to limit sweets and drinks with sugar .      Typical meal intake: Breakfast is egg, toast occasionally, sometimes skipped or; Lunch is a sandwich, has various snacks    Dinner 5-7 P.m.            Dietician visit, most recent: 06/2016 CDE visit: 11/17               Weight history:  Wt Readings from Last 3 Encounters:  08/26/22 216 lb (98 kg)  07/20/22 213 lb 3.2 oz (96.7 kg)  04/24/22 215 lb 3.2 oz (97.6 kg)    Glycemic control:   Lab Results  Component Value Date   HGBA1C 7.1 (H) 08/21/2022   HGBA1C 6.6 07/20/2022   HGBA1C 6.7 (H) 04/07/2022   Lab Results  Component Value Date   MICROALBUR 80 09/24/2020   LDLCALC 70 04/07/2022   CREATININE 0.83 04/07/2022   Lab Results  Component Value Date   MICRALBCREAT 516 (H) 08/21/2022       Allergies as of 08/26/2022   No Known Allergies      Medication List        Accurate as of August 26, 2022  8:20 AM. If you have any questions, ask your nurse or doctor.          aspirin 81 MG tablet Take 81 mg by mouth daily.   BD Pen Needle Nano 2nd Gen 32G X 4 MM Misc Generic drug: Insulin Pen Needle Use TID with insulin pens   FreeStyle Libre 2 Sensor Misc REPLACE EVERY 14 DAYS   insulin lispro 100 UNIT/ML KwikPen Commonly known as: HumaLOG KwikPen INJECT 8-14 UNITS SUBCUTANEOUS BEFORE EACH MEAL DAILY   losartan 25 MG tablet Commonly known as: COZAAR TAKE 1 TABLET(25 MG) BY MOUTH DAILY   simvastatin 40 MG tablet Commonly known as: ZOCOR TAKE 1 TABLET(40 MG) BY MOUTH DAILY AT 6 PM   Synjardy XR 12.09-998 MG Tb24 Generic drug: Empagliflozin-metFORMIN HCl ER TAKE 2 TABLETS BY MOUTH DAILY   Tresiba FlexTouch 200 UNIT/ML FlexTouch Pen Generic drug: insulin degludec ADMINISTER 40 UNITS UNDER THE SKIN DAILY What changed: See the new instructions.        Allergies: No Known Allergies  Past Medical History:  Diagnosis Date   Diabetes mellitus without complication    Essential hypertension 10/16/2015   History of oral surgery    Hyperlipidemia    OA (osteoarthritis) of hip 11/05/2015   Obese 10/16/2015   Smoking    Vitamin D deficiency 11/05/2015    Past Surgical History:  Procedure Laterality Date   CATARACT EXTRACTION W/PHACO Left 08/12/2020   Procedure: CATARACT  EXTRACTION PHACO AND INTRAOCULAR LENS PLACEMENT (IOC) LEFT DIABETIC PANOPTIX  TORIC LENS;  Surgeon: [place] CraneKing, Bradley Mark, MD;  [facility];  Service: Ophthalmology;  Laterality: Left;  3.75 0:30.1   CATARACT EXTRACTION W/PHACO Right 08/26/2020   Procedure: CATARACT EXTRACTION PHACO AND INTRAOCULAR LENS PLACEMENT (IOC) RIGHT DIABETIC PANOPTIX  LENS 2.82 00:26.1;  Surgeon: [place] CraneKing, Bradley Mark, MD;  Location: [facility];  Service: Ophthalmology;  Laterality: Right;  Diabetic    Family History  Problem Relation Age of Onset   Diabetes Mother    Congestive Heart Failure Mother    Cancer Sister        breast   Cancer Sister        cervical   Heart attack Brother     Social History:  reports that he has been smoking cigarettes. He has a 25.00 pack-year smoking history. His smokeless tobacco use includes chew. He reports that he does not currently use alcohol. He reports that he does not use drugs.   Review of Systems   Lipid history: Has excellent LDL levels with taking simvastatin 40 mg prescribed by his PCP HDL low, again triglycerides are normal    Lab Results  Component Value Date   CHOL 126 04/07/2022   HDL 33 (L) 04/07/2022   LDLCALC 70 04/07/2022   TRIG 125 04/07/2022   CHOLHDL 3.8 04/07/2022           Hypertension:  is being treated with losartan 25 mg Previously had mild hyperkalemia with lisinopril  Also on Jardiance Home BP 120s/70s recently  BP Readings from Last 3 Encounters:  08/26/22 124/86  07/20/22 125/76  04/24/22 138/82   Lab Results  Component Value Date   K 4.3 04/07/2022    Has eye exams regularly, was told to have mild background retinopathy, last exam 5/23  Most recent foot exam: 9/22   LABS:  Lab on 08/21/2022  Component Date Value Ref Range Status   Specific Gravity, UA 08/21/2022      >=1.030 (A)  1.005 - 1.030 Final   pH, UA 08/21/2022 5.5  5.0 - 7.5 Final   Color, UA 08/21/2022 Yellow  Yellow Final    Appearance Ur 08/21/2022 Clear  Clear Final   Leukocytes,UA 08/21/2022 Negative  Negative Final   Protein,UA 08/21/2022 2+ (A)  Negative/Trace Final   Glucose, UA 08/21/2022 3+ (A)  Negative Final   Ketones, UA 08/21/2022 Negative  Negative Final   RBC, UA 08/21/2022 Negative  Negative Final   Bilirubin, UA 08/21/2022 Negative  Negative Final   Urobilinogen, Ur 08/21/2022 0.2  0.2 - 1.0 mg/dL Final   Nitrite, UA 13/24/4010 Negative  Negative Final   Microscopic Examination 08/21/2022 See below:   Final   Microscopic was indicated and was performed.   Creatinine, Urine 08/21/2022 57.1  Not Estab. mg/dL Final   Microalbumin, Urine 08/21/2022 294.4  Not Estab. ug/mL Final   Microalb/Creat Ratio 08/21/2022 516 (H)  0 - 29 mg/g creat Final   Comment:                        Normal:                0 -  29                        Moderately increased: 30 - 300                        Severely increased:       >300    Hgb A1c MFr Bld 08/21/2022 7.1 (H)  4.8 - 5.6 % Final   Comment:          Prediabetes: 5.7 - 6.4          Diabetes: >6.4  Glycemic control for adults with diabetes: <7.0    Est. average glucose Bld gHb Est-m* 08/21/2022 157  mg/dL Final   WBC, UA 16/02/9603 None seen  0 - 5 /hpf Final   RBC, Urine 08/21/2022 None seen  0 - 2 /hpf Final   Epithelial Cells (non renal) 08/21/2022 None seen  0 - 10 /hpf Final   Casts 08/21/2022 None seen  None seen /lpf Final   Bacteria, UA 08/21/2022 None seen  None seen/Few Final    Physical Examination:  BP 124/86 (BP Location: Left Arm, Patient Position: Sitting, Cuff Size: Small)   Pulse 86   Ht 5' 10.5" (1.791 m)   Wt 216 lb (98 kg)   SpO2 97%   BMI 30.55 kg/m      ASSESSMENT:  Diabetes type 2, insulin-dependent  See history of present illness for detailed discussion of current diabetes management, blood sugar patterns and problems identified  His A1c is 7.1  He is on Synjardy XR twice daily and basal bolus insulin     Again has periodic postprandial hyperglycemia but less frequently recently   Hypertension: well-controlled especially with better readings at home, will not attempt to increase losartan at this time  Microalbuminuria: This will need to will need to be rechecked since his recent urine was highly concentrated and may be cause falsely high proteinuria Discussed that Chauncey Mann may be a option for microalbuminuria but will have to watch potassium  Renal function to be checked  LIPIDS: Well-controlled  PLAN:    Again reminded him to take only take 42 units of Tresiba and not higher doses unless eating large carbohydrate meals Mealtime insulin: With higher carbohydrate meals such as pasta and rice he will take 14 to 15 units of Humalog To also take his Humalog with him when eating out consistently  Exercise as much as tolerated Check on chemistry panel He will try to take Humalog consistently before starting to eat and try to take it with him when going out If morning sugars are consistently over 130 will go up 2 units on the Tresiba Will check on the libre 3 when this is covered on his insurance  There are no Patient Instructions on file for this visit.     Reather Littler 08/26/2022, 8:20 AM   Note: This office note was prepared with Dragon voice recognition system technology. Any transcriptional errors that result from this process are unintentional.  Addendum: His repeat microalbumin ratio is 75, slightly better than last year and will continue to monitor without change in treatment Other chemistry okay  Reather Littler

## 2022-08-27 ENCOUNTER — Encounter: Payer: Self-pay | Admitting: Endocrinology

## 2022-09-15 ENCOUNTER — Other Ambulatory Visit: Payer: Self-pay

## 2022-09-15 DIAGNOSIS — E1169 Type 2 diabetes mellitus with other specified complication: Secondary | ICD-10-CM

## 2022-09-15 MED ORDER — SIMVASTATIN 40 MG PO TABS
ORAL_TABLET | ORAL | 1 refills | Status: DC
Start: 2022-09-15 — End: 2023-03-10

## 2022-09-22 ENCOUNTER — Other Ambulatory Visit: Payer: Self-pay

## 2022-09-22 DIAGNOSIS — E1165 Type 2 diabetes mellitus with hyperglycemia: Secondary | ICD-10-CM

## 2022-09-22 MED ORDER — FREESTYLE LIBRE 2 SENSOR MISC
11 refills | Status: DC
Start: 2022-09-22 — End: 2023-01-19

## 2022-09-23 ENCOUNTER — Encounter: Payer: Self-pay | Admitting: *Deleted

## 2022-09-23 NOTE — Progress Notes (Signed)
[facility] Quality Team Note  Name: [name] Date of Birth: 1950/09/08 MRN: [id number] Date: 09/23/2022  [facility] Quality Team has reviewed this patient's chart, please see recommendations below:  [facility] Quality Other; Pt has open gap for colonoscopy/cologuard/and/or Ifobt.  Pt has ov on 11/24/22.  Could you see if pt is interested in pursuing a colon screening?

## 2022-09-29 ENCOUNTER — Other Ambulatory Visit: Payer: Self-pay

## 2022-09-29 MED ORDER — BD PEN NEEDLE NANO 2ND GEN 32G X 4 MM MISC: 4 refills | Status: DC

## 2022-10-08 ENCOUNTER — Other Ambulatory Visit: Payer: Self-pay

## 2022-10-08 DIAGNOSIS — I10 Essential (primary) hypertension: Secondary | ICD-10-CM

## 2022-10-08 MED ORDER — LOSARTAN POTASSIUM 25 MG PO TABS
ORAL_TABLET | ORAL | 3 refills | Status: DC
Start: 2022-10-08 — End: 2023-01-19

## 2022-10-15 DIAGNOSIS — H26492 Other secondary cataract, left eye: Secondary | ICD-10-CM | POA: Diagnosis not present

## 2022-10-15 DIAGNOSIS — E119 Type 2 diabetes mellitus without complications: Secondary | ICD-10-CM | POA: Diagnosis not present

## 2022-10-15 DIAGNOSIS — Z961 Presence of intraocular lens: Secondary | ICD-10-CM | POA: Diagnosis not present

## 2022-10-15 LAB — HM DIABETES EYE EXAM

## 2022-10-20 ENCOUNTER — Encounter: Payer: Self-pay | Admitting: Family Medicine

## 2022-10-21 DIAGNOSIS — M25462 Effusion, left knee: Secondary | ICD-10-CM | POA: Diagnosis not present

## 2022-10-21 DIAGNOSIS — M1712 Unilateral primary osteoarthritis, left knee: Secondary | ICD-10-CM | POA: Diagnosis not present

## 2022-10-26 ENCOUNTER — Telehealth: Payer: Self-pay | Admitting: Endocrinology

## 2022-10-26 MED ORDER — SYNJARDY XR 12.5-1000 MG PO TB24: 2.0000 | ORAL_TABLET | Freq: Every day | ORAL | 3 refills | Status: DC

## 2022-10-26 NOTE — Telephone Encounter (Addendum)
MEDICATION:  Synjardy XR SYNJARDY XR 12.09-998 MG TB24  PHARMACY:    [id number] - [place], [place] - [address] (Ph: [phone])    HAS THE PATIENT CONTACTED THEIR PHARMACY?  Yes  IS THIS A 90 DAY SUPPLY : Yes  IS PATIENT OUT OF MEDICATION: No  IF NOT; HOW MUCH IS LEFT: 1 day left  LAST APPOINTMENT DATE: @4 /02/2023  NEXT APPOINTMENT DATE:@8 /13/2024  DO WE HAVE YOUR PERMISSION TO LEAVE A DETAILED MESSAGE?: Yes  OTHER COMMENTS: Patient states he has left 2 voice mails about refills and states that pharmacy says that they have too.   **Let patient know to contact pharmacy at the end of the day to make sure medication is ready. **  ** Please notify patient to allow 48-72 hours to process**  **Encourage patient to contact the pharmacy for refills or they can request refills through [facility]**

## 2022-10-26 NOTE — Telephone Encounter (Signed)
Done

## 2022-11-24 ENCOUNTER — Ambulatory Visit: Payer: PPO | Admitting: Family Medicine

## 2022-12-02 DIAGNOSIS — M1712 Unilateral primary osteoarthritis, left knee: Secondary | ICD-10-CM | POA: Diagnosis not present

## 2022-12-03 ENCOUNTER — Ambulatory Visit: Payer: PPO | Admitting: Family Medicine

## 2022-12-11 DIAGNOSIS — M1712 Unilateral primary osteoarthritis, left knee: Secondary | ICD-10-CM | POA: Diagnosis not present

## 2022-12-18 ENCOUNTER — Ambulatory Visit (INDEPENDENT_AMBULATORY_CARE_PROVIDER_SITE_OTHER): Payer: PPO | Admitting: Family Medicine

## 2022-12-18 ENCOUNTER — Other Ambulatory Visit: Payer: Self-pay

## 2022-12-18 ENCOUNTER — Encounter: Payer: Self-pay | Admitting: Family Medicine

## 2022-12-18 VITALS — BP 129/80 | HR 77 | Ht 70.5 in | Wt 212.4 lb

## 2022-12-18 DIAGNOSIS — I152 Hypertension secondary to endocrine disorders: Secondary | ICD-10-CM

## 2022-12-18 DIAGNOSIS — M1712 Unilateral primary osteoarthritis, left knee: Secondary | ICD-10-CM

## 2022-12-18 DIAGNOSIS — E782 Mixed hyperlipidemia: Secondary | ICD-10-CM | POA: Diagnosis not present

## 2022-12-18 DIAGNOSIS — Z7984 Long term (current) use of oral hypoglycemic drugs: Secondary | ICD-10-CM | POA: Diagnosis not present

## 2022-12-18 DIAGNOSIS — E1169 Type 2 diabetes mellitus with other specified complication: Secondary | ICD-10-CM

## 2022-12-18 DIAGNOSIS — E119 Type 2 diabetes mellitus without complications: Secondary | ICD-10-CM

## 2022-12-18 DIAGNOSIS — E1165 Type 2 diabetes mellitus with hyperglycemia: Secondary | ICD-10-CM

## 2022-12-18 DIAGNOSIS — E1159 Type 2 diabetes mellitus with other circulatory complications: Secondary | ICD-10-CM

## 2022-12-18 DIAGNOSIS — Z794 Long term (current) use of insulin: Secondary | ICD-10-CM | POA: Diagnosis not present

## 2022-12-18 LAB — POCT GLYCOSYLATED HEMOGLOBIN (HGB A1C): HbA1c POC (<> result, manual entry): 6.4 % (ref 4.0–5.6)

## 2022-12-18 MED ORDER — IBUPROFEN 800 MG PO TABS: 800.0000 mg | ORAL_TABLET | Freq: Three times a day (TID) | ORAL | 1 refills | Status: AC | PRN

## 2022-12-18 NOTE — Patient Instructions (Signed)
It was nice to see you today,  We addressed the following topics today: - I have prescribed ibuprofen to use before you play golf.  - you can also use topical treatments as needed for your knee pain - we will let you know your lab results when we get them.    Have a great day,  Frederic Jericho, MD

## 2022-12-18 NOTE — Progress Notes (Unsigned)
   Established Patient Office Visit  Subjective   Patient ID: Jose Munoz, male    DOB: [birth year]  Age: 72 y.o. MRN: [id number]  No chief complaint on file.   HPI Diabetes-endocrinology.  Jose Munoz.  Foot exam.  1 year.  Libre.    HTN-losartan  HLD-simvastatin  Smoking - widiwer. Grown   Ibuprofen? - [facility].    2 months ago left knee.  [facility]. Xray - arthritis.  Cortisone shot.  About 10 days.  Bothers more when he plays golf.  Before plays golf.  3 days a week.  Went to pt.  Voltaren gel - 7 days.  Helped a bit.    6.4.    The ASCVD Risk score (Arnett DK, et al., 2019) failed to calculate for the following reasons:   The valid total cholesterol range is 130 to 320 mg/dL   {History ([id number]):47829}  ROS    Objective:     There were no vitals taken for this visit. {Vitals History (Optional):23777}  Physical Exam   No results found for any visits on 12/18/22.  {Labs (Optional):23779}      Assessment & Plan:   There are no diagnoses linked to this encounter.   No follow-ups on file.    Jose Kitty, MD

## 2022-12-19 DIAGNOSIS — M1712 Unilateral primary osteoarthritis, left knee: Secondary | ICD-10-CM | POA: Insufficient documentation

## 2022-12-19 NOTE — Assessment & Plan Note (Signed)
Well-controlled blood pressure.  Continue current treatment.  No side effects.

## 2022-12-19 NOTE — Assessment & Plan Note (Signed)
Patient accepting of the risks of NSAID use.  Has not had good results with meloxicam or naproxen in the past.  Advised patient he can also use over-the-counter topicals.  Recommended knee compression sleeve for swelling which patient is already wearing when he plays golf.

## 2022-12-19 NOTE — Assessment & Plan Note (Signed)
Well-controlled.  On statin.  Continue current medication.

## 2022-12-19 NOTE — Assessment & Plan Note (Signed)
Advised patient we can manage his diabetes going forward.  He is well-controlled.  Continue his current regimen.

## 2022-12-21 DIAGNOSIS — M1712 Unilateral primary osteoarthritis, left knee: Secondary | ICD-10-CM | POA: Diagnosis not present

## 2022-12-29 ENCOUNTER — Ambulatory Visit (INDEPENDENT_AMBULATORY_CARE_PROVIDER_SITE_OTHER): Payer: PPO | Admitting: Endocrinology

## 2022-12-29 ENCOUNTER — Encounter: Payer: Self-pay | Admitting: Endocrinology

## 2022-12-29 VITALS — BP 130/75 | HR 83 | Ht 70.0 in | Wt 213.6 lb

## 2022-12-29 DIAGNOSIS — E1129 Type 2 diabetes mellitus with other diabetic kidney complication: Secondary | ICD-10-CM | POA: Diagnosis not present

## 2022-12-29 DIAGNOSIS — E1165 Type 2 diabetes mellitus with hyperglycemia: Secondary | ICD-10-CM | POA: Diagnosis not present

## 2022-12-29 DIAGNOSIS — R809 Proteinuria, unspecified: Secondary | ICD-10-CM | POA: Diagnosis not present

## 2022-12-29 DIAGNOSIS — I10 Essential (primary) hypertension: Secondary | ICD-10-CM | POA: Diagnosis not present

## 2022-12-29 DIAGNOSIS — Z794 Long term (current) use of insulin: Secondary | ICD-10-CM | POA: Diagnosis not present

## 2022-12-29 NOTE — Progress Notes (Signed)
Patient ID: Jose Munoz, male   DOB: 1951-04-09, 72 y.o.   MRN: [id number]           Reason for Appointment:  Follow-up for Type 2 Diabetes   History of Present Illness:          Date of diagnosis of type 2 diabetes mellitus: 1998       Background history:   He thinks his diabetes was relatively easier to control and the onset even though he had initial symptoms of thirst and weight loss He had done very well with his diet and lost a significant amount of weight and was approved to control his diabetes without medications for a few years Subsequently he started with oral medications including metformin, Actos and Amaryl He thinks his blood sugars were well controlled for a few years initially but started getting higher progressively since 2016. Because of poor control he was started on basal bolus insulin regimen on his initial consultation, highest A1c was 11.9 in 01/2016  Recent history:    INSULIN regimen: TRESIBA 40 units daily at bedtime, HUMALOG 8 to 12 units at meals  Non-insulin hypoglycemic drugs the patient is taking are: Synjardy XR 12.09/998, 2 tablets  Current management, blood sugar patterns and problems identified: His A1c is 6.4 done by his PCP recently by fingerstick and his recent GMI is 6.5  He is more consistent with taking his Humalog before meals even when eating out Even though he is adjusting it somewhat based on the how many carbohydrates he is eating his usual are not adding more when he is eating higher fat meals like biscuits Currently highest blood sugars are either at breakfast or dinner based on his diet Also that he has lost a little weight likely from better eating Currently his fasting readings are slightly better although he does have a dawn phenomenon and also has some rise in blood sugar in the mornings when he drinks 3 cups of coffee Because of knee pain he cannot exercise as before except for playing some golf   Usually not eating breakfast  but may have morning rise in blood sugar with coffee which she does not cover with insulin No hypoglycemia including overnight although occasionally may have low normal readings in the afternoon without symptoms         Side effects from medications have been: None  CONTINUOUS GLUCOSE MONITORING RECORD INTERPRETATION    Dates of Recording: Last 2 weeks through 8/13  Sensor description: Libre 2  Glycemic patterns summary: Blood sugars are on an average Lowest around 3 AM and generally the highest around 11 PM  Overnight periods: He has the lowest blood sugars around 3 AM with no hypoglycemia and a gradual increase after the trough at 3 AM Preprandial periods: Blood sugars are mildly increased at lunch and dinner with some variability No hypoglycemia during the day  Postprandial periods:   After morning call:   Blood sugars rising variably with occasional peaks over 180 around 11 AM  After lunch:   Blood sugars are in general levels with the [place]al blood sugars with some variability After dinner: Blood sugars are mostly in the 1 50-1 60 average with occasional spikes above 180 but not consistently  Results statistics:  CGM use % of time   2-week average/GV 134  Time in range       90%  % Time Above 180 10  % Time above 250   % Time Below 70  PRE-MEAL Fasting Lunch Dinner Bedtime Overall  Glucose range:       Averages: 130       POST-MEAL PC Breakfast PC Lunch PC Dinner  Glucose range:     Averages: 159 151 168   Previously  CGM use % of time 78  2-week average/GV 139/27  Time in range 86     %  % Time Above 180 13+1  % Time above 250   % Time Below 70        Self-care: The diet that the patient has been following is: tries to limit sweets and drinks with sugar .      Typical meal intake: Breakfast is egg, toast occasionally, sometimes skipped or; Lunch is a sandwich, has various snacks    Dinner 5-7 P.m.            Dietician visit, most recent:  06/2016 CDE visit: 11/17              Weight history:  Wt Readings from Last 3 Encounters:  12/29/22 213 lb 9.6 oz (96.9 kg)  12/18/22 212 lb 6.4 oz (96.3 kg)  08/26/22 216 lb (98 kg)    Glycemic control:   Lab Results  Component Value Date   HGBA1C 6.4 12/18/2022   HGBA1C 7.1 (H) 08/21/2022   HGBA1C 6.6 07/20/2022   Lab Results  Component Value Date   MICROALBUR 44.9 (H) 08/26/2022   LDLCALC 54 12/18/2022   CREATININE 0.72 (L) 12/18/2022   Lab Results  Component Value Date   MICRALBCREAT 74.8 (H) 08/26/2022       Allergies as of 12/29/2022   No Known Allergies      Medication List        Accurate as of December 29, 2022  8:31 AM. If you have any questions, ask your nurse or doctor.          aspirin 81 MG tablet Take 81 mg by mouth daily.   BD Pen Needle Nano 2nd Gen 32G X 4 MM Misc Generic drug: Insulin Pen Needle Use TID with insulin pens   FreeStyle Libre 2 Sensor Misc REPLACE EVERY 14 DAYS   ibuprofen 800 MG tablet Commonly known as: ADVIL Take 1 tablet (800 mg total) by mouth every 8 (eight) hours as needed.   insulin lispro 100 UNIT/ML KwikPen Commonly known as: HumaLOG KwikPen INJECT 8-14 UNITS SUBCUTANEOUS BEFORE EACH MEAL DAILY   losartan 25 MG tablet Commonly known as: COZAAR TAKE 1 TABLET(25 MG) BY MOUTH DAILY   meloxicam 15 MG tablet Commonly known as: MOBIC Take 15 mg by mouth daily.   simvastatin 40 MG tablet Commonly known as: ZOCOR TAKE 1 TABLET(40 MG) BY MOUTH DAILY AT 6 PM   Synjardy XR 12.09-998 MG Tb24 Generic drug: Empagliflozin-metFORMIN HCl ER Take 2 tablets by mouth daily.   [name] FlexTouch 200 UNIT/ML FlexTouch Pen Generic drug: insulin degludec ADMINISTER 40 UNITS UNDER THE SKIN DAILY What changed: See the new instructions.        Allergies: No Known Allergies  Past Medical History:  Diagnosis Date   Diabetes mellitus without complication (HCC)    Essential hypertension 10/16/2015   History of oral  surgery    Hyperlipidemia    OA (osteoarthritis) of hip 11/05/2015   Obese 10/16/2015   Smoking    Vitamin D deficiency 11/05/2015    Past Surgical History:  Procedure Laterality Date   CATARACT EXTRACTION W/PHACO Left 08/12/2020   Procedure: CATARACT EXTRACTION PHACO AND INTRAOCULAR LENS  PLACEMENT (IOC) LEFT DIABETIC PANOPTIX  TORIC LENS;  Surgeon: Nevada Crane, MD;  Location: [facility];  Service: Ophthalmology;  Laterality: Left;  3.75 0:30.1   CATARACT EXTRACTION W/PHACO Right 08/26/2020   Procedure: CATARACT EXTRACTION PHACO AND INTRAOCULAR LENS PLACEMENT (IOC) RIGHT DIABETIC PANOPTIX  LENS 2.82 00:26.1;  Surgeon: Nevada Crane, MD;  Location: [facility];  Service: Ophthalmology;  Laterality: Right;  Diabetic    Family History  Problem Relation Age of Onset   Diabetes Mother    Congestive Heart Failure Mother    Cancer Sister        breast   Cancer Sister        cervical   Heart attack Brother     Social History:  reports that he has been smoking cigarettes. He has a 25 pack-year smoking history. His smokeless tobacco use includes chew. He reports that he does not currently use alcohol. He reports that he does not use drugs.   Review of Systems   Lipid history: Has excellent LDL levels with taking simvastatin 40 mg prescribed by his PCP HDL low, again triglycerides are normal    Lab Results  Component Value Date   CHOL 114 12/18/2022   HDL 40 12/18/2022   LDLCALC 54 12/18/2022   TRIG 110 12/18/2022   CHOLHDL 2.9 12/18/2022           Hypertension:  is being treated with losartan 25 mg Previously had mild hyperkalemia with lisinopril  Also on Jardiance He does monitor at home   BP Readings from Last 3 Encounters:  12/29/22 130/75  12/18/22 129/80  08/26/22 124/86   Lab Results  Component Value Date   K 4.4 12/18/2022    Has eye exams regularly, was told to have mild background retinopathy, last exam 5/23    LABS:  No  visits with results within 1 Week(s) from this visit.  Latest known visit with results is:  Office Visit on 12/18/2022  Component Date Value Ref Range Status   HbA1c POC (<> result, manual entry) 12/18/2022 6.4  4.0 - 5.6 % Final   Glucose 12/18/2022 110 (H)  70 - 99 mg/dL Final   BUN 11/91/4782 15  8 - 27 mg/dL Final   Creatinine, Ser 12/18/2022 0.72 (L)  0.76 - 1.27 mg/dL Final   eGFR 95/62/1308 96  >59 mL/min/1.73 Final   BUN/Creatinine Ratio 12/18/2022 21  10 - 24 Final   Sodium 12/18/2022 138  134 - 144 mmol/L Final   Potassium 12/18/2022 4.4  3.5 - 5.2 mmol/L Final   Chloride 12/18/2022 100  96 - 106 mmol/L Final   CO2 12/18/2022 23  20 - 29 mmol/L Final   Calcium 12/18/2022 9.3  8.6 - 10.2 mg/dL Final   Cholesterol, Total 12/18/2022 114  100 - 199 mg/dL Final   Triglycerides 65/78/4696 110  0 - 149 mg/dL Final   HDL 29/52/8413 40  >39 mg/dL Final   VLDL Cholesterol Cal 12/18/2022 20  5 - 40 mg/dL Final   LDL Chol Calc (NIH) 12/18/2022 54  0 - 99 mg/dL Final   Chol/HDL Ratio 12/18/2022 2.9  0.0 - 5.0 ratio Final   Comment:                                   T. Chol/HDL Ratio  Men  Women                               1/2 Avg.Risk  3.4    3.3                                   Avg.Risk  5.0    4.4                                2X Avg.Risk  9.6    7.1                                3X Avg.Risk 23.4   11.0     Physical Examination:  BP 130/75   Pulse 83   Ht 5\' 10"  (1.778 m)   Wt 213 lb 9.6 oz (96.9 kg)   SpO2 98%   BMI 30.65 kg/m      ASSESSMENT:  Diabetes type 2, insulin-dependent  See history of present illness for detailed discussion of current diabetes management, blood sugar patterns and problems identified  His A1c is 6.4 compared to  He is on Synjardy XR twice daily and basal bolus insulin   He is doing a little better with diet and losing a little weight Also better consistent with his insulin at mealtimes Also  needing slightly less basal insulin As above he has higher fat intake at times causing readings over 200 but this is not frequent  Hypertension: well-controlled   Renal function normal but hopefully can avoid taking long-term meloxicam which she is doing for his knee pain  Microalbuminuria: This will need to will need to be rechecked on his follow-up and if it is increasing he may either need a nephrology consultation or plan to add Chauncey Mann which appears to be be a good option for microalbuminuria as long as he can afford it  Up-to-date with foot and eye exams  PLAN:   Again reminded him to adjust his mealtime insulin based on what he is eating and may need to take another 3 to 4 units for higher fat meals Also try to avoid high-fat meals as much as possible Also continue to try and take Humalog before starting to eat consistently Need to continue with same doses of Tresiba unless fasting readings are consistently higher and not adjusted based on what he is eating He will need to go to the [place] [facility] when this is covered on his insurance Resume exercise when able to  There are no Patient Instructions on file for this visit.     Reather Littler 12/29/2022, 8:31 AM   Note: This office note was prepared with Dragon voice recognition system technology. Any transcriptional errors that result from this process are unintentional.

## 2022-12-30 ENCOUNTER — Encounter: Payer: Self-pay | Admitting: Endocrinology

## 2022-12-31 DIAGNOSIS — M1712 Unilateral primary osteoarthritis, left knee: Secondary | ICD-10-CM | POA: Diagnosis not present

## 2023-01-07 DIAGNOSIS — M1712 Unilateral primary osteoarthritis, left knee: Secondary | ICD-10-CM | POA: Diagnosis not present

## 2023-01-14 DIAGNOSIS — M25562 Pain in left knee: Secondary | ICD-10-CM | POA: Diagnosis not present

## 2023-01-14 DIAGNOSIS — M1712 Unilateral primary osteoarthritis, left knee: Secondary | ICD-10-CM | POA: Diagnosis not present

## 2023-01-19 ENCOUNTER — Telehealth: Payer: Self-pay

## 2023-01-19 DIAGNOSIS — I10 Essential (primary) hypertension: Secondary | ICD-10-CM

## 2023-01-19 DIAGNOSIS — E1165 Type 2 diabetes mellitus with hyperglycemia: Secondary | ICD-10-CM

## 2023-01-19 MED ORDER — FREESTYLE LIBRE 2 SENSOR MISC
11 refills | Status: DC
Start: 2023-01-19 — End: 2023-10-20

## 2023-01-19 MED ORDER — SYNJARDY XR 12.5-1000 MG PO TB24
2.0000 | ORAL_TABLET | Freq: Every day | ORAL | 3 refills | Status: DC
Start: 2023-01-19 — End: 2023-06-16

## 2023-01-19 MED ORDER — TRESIBA FLEXTOUCH 200 UNIT/ML ~~LOC~~ SOPN
42.0000 [IU] | PEN_INJECTOR | Freq: Every day | SUBCUTANEOUS | 10 refills | Status: DC
Start: 2023-01-19 — End: 2023-07-19

## 2023-01-19 MED ORDER — LOSARTAN POTASSIUM 25 MG PO TABS
ORAL_TABLET | ORAL | 3 refills | Status: DC
Start: 2023-01-19 — End: 2023-06-02

## 2023-01-19 MED ORDER — BD PEN NEEDLE NANO 2ND GEN 32G X 4 MM MISC
4 refills | Status: DC
Start: 2023-01-19 — End: 2024-02-10

## 2023-01-19 MED ORDER — INSULIN LISPRO (1 UNIT DIAL) 100 UNIT/ML (KWIKPEN)
PEN_INJECTOR | SUBCUTANEOUS | 2 refills | Status: DC
Start: 2023-01-19 — End: 2023-06-17

## 2023-01-19 NOTE — Telephone Encounter (Signed)
Meds ordered this encounter  Medications   insulin degludec (TRESIBA FLEXTOUCH) 200 UNIT/ML FlexTouch Pen    Sig: Inject 42 Units into the skin daily at 6 (six) AM.    Dispense:  3 mL    Refill:  10    Order Specific Question:   Supervising Provider    Answer:   Nani Gasser D [[id number]]   Insulin Pen Needle (BD PEN NEEDLE NANO 2ND GEN) 32G X 4 MM MISC    Sig: Use TID with insulin pens    Dispense:  300 each    Refill:  4    Order Specific Question:   Supervising Provider    Answer:   Nani Gasser D [[id number]]   insulin lispro (HUMALOG KWIKPEN) 100 UNIT/ML KwikPen    Sig: INJECT 8-14 UNITS SUBCUTANEOUS BEFORE EACH MEAL DAILY    Dispense:  15 mL    Refill:  2    Order Specific Question:   Supervising Provider    Answer:   Nani Gasser D [[id number]]   Empagliflozin-metFORMIN HCl ER (SYNJARDY XR) 12.09-998 MG TB24    Sig: Take 2 tablets by mouth daily.    Dispense:  60 tablet    Refill:  3    Order Specific Question:   Supervising Provider    Answer:   Nani Gasser D [[id number]]   Continuous Glucose Sensor (FREESTYLE LIBRE 2 SENSOR) MISC    Sig: REPLACE EVERY 14 DAYS    Dispense:  2 each    Refill:  11    Order Specific Question:   Supervising Provider    Answer:   Nani Gasser D [[id number]]   losartan (COZAAR) 25 MG tablet    Sig: TAKE 1 TABLET(25 MG) BY MOUTH DAILY    Dispense:  30 tablet    Refill:  3    Order Specific Question:   Supervising Provider    Answer:   Nani Gasser D [[id number]]

## 2023-01-19 NOTE — Telephone Encounter (Signed)
Prescription Request  PT ENDOCRINOLOGIST IS RETIRING AND DR. Constance Goltz HAS AGREED TO MANAGE DM AT THIS POINT.  01/19/2023  LOV: 12/18/22  What is the name of the medication or equipment?  TRESIBA FLEXTOUCH 200 UNIT/ML FlexTouch Pen With NEEDLES Empagliflozin-metFORMIN HCl ER (SYNJARDY XR) 12.09-998 MG TB24  losartan (COZAAR) 25 MG tablet  insulin lispro (HUMALOG KWIKPEN) 100 UNIT/ML KwikPen  Insulin Pen Needle (BD PEN NEEDLE NANO 2ND GEN) 32G X 4 MM MISC Continuous Glucose Sensor (FREESTYLE LIBRE 2 SENSOR) MISC    Have you contacted your pharmacy to request a refill? No   Which pharmacy would you like this sent to?  [id number] - [place], [place] - [address] AT [address] [place] [address] Phone: [phone] Fax: [phone]    Patient notified that their request is being sent to the clinical staff for review and that they should receive a response within 2 business days.   Please advise at Mobile [phone] (mobile)

## 2023-01-19 NOTE — Addendum Note (Signed)
Addended by: Saralyn Pilar on: 01/19/2023 03:07 PM   Modules accepted: Orders

## 2023-02-03 DIAGNOSIS — M25562 Pain in left knee: Secondary | ICD-10-CM | POA: Diagnosis not present

## 2023-02-10 DIAGNOSIS — M25562 Pain in left knee: Secondary | ICD-10-CM | POA: Diagnosis not present

## 2023-02-12 DIAGNOSIS — M1712 Unilateral primary osteoarthritis, left knee: Secondary | ICD-10-CM | POA: Diagnosis not present

## 2023-02-18 ENCOUNTER — Encounter: Payer: Self-pay | Admitting: Family Medicine

## 2023-02-18 ENCOUNTER — Ambulatory Visit (INDEPENDENT_AMBULATORY_CARE_PROVIDER_SITE_OTHER): Payer: PPO | Admitting: Family Medicine

## 2023-02-18 VITALS — BP 100/63 | HR 80 | Ht 70.0 in | Wt 212.1 lb

## 2023-02-18 DIAGNOSIS — H6122 Impacted cerumen, left ear: Secondary | ICD-10-CM | POA: Diagnosis not present

## 2023-02-18 DIAGNOSIS — Z23 Encounter for immunization: Secondary | ICD-10-CM

## 2023-02-18 DIAGNOSIS — M1712 Unilateral primary osteoarthritis, left knee: Secondary | ICD-10-CM

## 2023-02-18 NOTE — Patient Instructions (Addendum)
It was nice to see you today,  We addressed the following topics today: -If you need your earwax treated again in the future just let us know.  After removing the earwax your ear canals look normal.  Follow-up as needed.    Have a great day,  Frederic Jericho, MD

## 2023-02-18 NOTE — Assessment & Plan Note (Signed)
Patient has seen orthopedist at [facility] and tried steroid knee injection and then hyaluronic knee injections.  Patient also had MRI and was advised he would likely need a knee replacement.  Patient considering knee replacement in the future.

## 2023-02-18 NOTE — Progress Notes (Signed)
Acute Office Visit  Subjective:     Patient ID: Jose Munoz, male    DOB: 11-12-1950, 72 y.o.   MRN: [id number]  Chief Complaint  Patient presents with   Ear Fullness    HPI Patient is in today for ear complaint.  He periodically has wax buildup in his ears and feels like the left ear is worse than the right right now but noticed that in the last couple days the right seems to be getting stuffed up to.  No changes in hearing.  No ear pain just feeling of clogging.  Patient recently went to [facility] for his left knee osteoarthritis.  Had a steroid injection that did not help, had hyaluronic acid injections at helped somewhat.  He had an MRI that showed severe osteoarthritis and the orthopedist recommended knee replacement.  ROS      Objective:    BP 100/63   Pulse 80   Ht 5\' 10"  (1.778 m)   Wt 212 lb 1.9 oz (96.2 kg)   SpO2 96%   BMI 30.44 kg/m    Physical Exam General: Alert, oriented HEENT: Left ear with cerumen impaction.  Right ear canal shows presence of some cerumen but tympanic membrane is visualized.  No results found for any visits on 02/18/23.      Assessment & Plan:   Impacted cerumen of left ear -     Ear wax removal  Hearing loss of left ear due to cerumen impaction Assessment & Plan: Ears were examined bilaterally with otoscope.  Left ear had complete obstruction, right ear showed partial obstruction of the tympanic membrane.  Initial temp to remove with lavage was unsuccessful.  With the aid of the MA,-removed the obstructing cerumen from the left ear with curette.  Earwax was removed from right ear as well.  Post removal visualization showed normal tympanic membranes and normal ear canals bilaterally.  Patient noted hearing had improved.   Need for influenza vaccination -     Flu Vaccine Trivalent High Dose (Fluad)  Primary osteoarthritis of left knee Assessment & Plan: Patient has seen orthopedist at [facility] and tried steroid knee  injection and then hyaluronic knee injections.  Patient also had MRI and was advised he would likely need a knee replacement.  Patient considering knee replacement in the future.      Return if symptoms worsen or fail to improve.  [name], MD

## 2023-02-18 NOTE — Assessment & Plan Note (Signed)
Ears were examined bilaterally with otoscope.  Left ear had complete obstruction, right ear showed partial obstruction of the tympanic membrane.  Initial temp to remove with lavage was unsuccessful.  With the aid of the MA,-removed the obstructing cerumen from the left ear with curette.  Earwax was removed from right ear as well.  Post removal visualization showed normal tympanic membranes and normal ear canals bilaterally.  Patient noted hearing had improved.

## 2023-03-05 DIAGNOSIS — E1165 Type 2 diabetes mellitus with hyperglycemia: Secondary | ICD-10-CM

## 2023-03-10 ENCOUNTER — Other Ambulatory Visit: Payer: Self-pay | Admitting: Family Medicine

## 2023-03-10 DIAGNOSIS — E1169 Type 2 diabetes mellitus with other specified complication: Secondary | ICD-10-CM

## 2023-03-10 NOTE — Telephone Encounter (Signed)
Okay, the only lab I need is an A1c so if he wants to come in for a lab visit for that he can.

## 2023-03-16 ENCOUNTER — Other Ambulatory Visit: Payer: PPO

## 2023-03-16 DIAGNOSIS — E1165 Type 2 diabetes mellitus with hyperglycemia: Secondary | ICD-10-CM

## 2023-03-16 DIAGNOSIS — Z794 Long term (current) use of insulin: Secondary | ICD-10-CM | POA: Diagnosis not present

## 2023-03-16 DIAGNOSIS — M1712 Unilateral primary osteoarthritis, left knee: Secondary | ICD-10-CM | POA: Diagnosis not present

## 2023-03-17 LAB — HEMOGLOBIN A1C
Est. average glucose Bld gHb Est-mCnc: 151 mg/dL
Hgb A1c MFr Bld: 6.9 % — ABNORMAL HIGH (ref 4.8–5.6)

## 2023-03-23 ENCOUNTER — Ambulatory Visit (INDEPENDENT_AMBULATORY_CARE_PROVIDER_SITE_OTHER): Payer: PPO

## 2023-03-23 ENCOUNTER — Encounter: Payer: PPO | Admitting: Family Medicine

## 2023-03-23 DIAGNOSIS — Z0000 Encounter for general adult medical examination without abnormal findings: Secondary | ICD-10-CM

## 2023-03-23 NOTE — Progress Notes (Signed)
Subjective:   Jose Munoz is a 72 y.o. male who presents for Medicare Annual/Subsequent preventive examination.  Visit Complete: Virtual I connected with  Delorise Royals on 03/23/23 by a audio enabled telemedicine application and verified that I am speaking with the correct person using two identifiers.  Patient Location: Home  Provider Location: Office/Clinic  I discussed the limitations of evaluation and management by telemedicine. The patient expressed understanding and agreed to proceed.  Vital Signs: Because this visit was a virtual/telehealth visit, some criteria may be missing or patient reported. Any vitals not documented were not able to be obtained and vitals that have been documented are patient reported.  Patient Medicare AWV questionnaire was completed by the patient on 03/19/2023; I have confirmed that all information answered by patient is correct and no changes since this date.  Cardiac Risk Factors include: advanced age (>41men, >60 women);diabetes mellitus;dyslipidemia;hypertension;male gender;smoking/ tobacco exposure     Objective:    Today's Vitals   There is no height or weight on file to calculate BMI.     03/23/2023    8:31 AM 12/29/2021    8:09 AM 08/26/2020    6:26 AM 08/12/2020    8:57 AM 07/02/2016    2:20 PM 10/16/2015    2:30 PM  Advanced Directives  Does Patient Have a Medical Advance Directive? Yes No Yes Yes Yes Yes  Type of Estate agent of [place];Living will  Healthcare Power of [place];Living will Healthcare Power of [place];Living will  Healthcare Power of [place];Living will  Does patient want to make changes to medical advance directive?   No - Patient declined No - Patient declined  No - Patient declined  Copy of Healthcare Power of Attorney in Chart? No - copy requested  Yes - validated most recent copy scanned in chart (See row information) Yes - validated most recent copy scanned in chart (See row information)     Would patient like information on creating a medical advance directive?  No - Patient declined        Current Medications (verified) Outpatient Encounter Medications as of 03/23/2023  Medication Sig   aspirin 81 MG tablet Take 81 mg by mouth daily.   celecoxib (CELEBREX) 200 MG capsule Take 200 mg by mouth daily.   Continuous Glucose Sensor (FREESTYLE LIBRE 2 SENSOR) MISC REPLACE EVERY 14 DAYS   Empagliflozin-metFORMIN HCl ER (SYNJARDY XR) 12.09-998 MG TB24 Take 2 tablets by mouth daily.   ibuprofen (ADVIL) 800 MG tablet Take 1 tablet (800 mg total) by mouth every 8 (eight) hours as needed.   insulin degludec (TRESIBA FLEXTOUCH) 200 UNIT/ML FlexTouch Pen Inject 42 Units into the skin daily at 6 (six) AM.   insulin lispro (HUMALOG KWIKPEN) 100 UNIT/ML KwikPen INJECT 8-14 UNITS SUBCUTANEOUS BEFORE EACH MEAL DAILY   Insulin Pen Needle (BD PEN NEEDLE NANO 2ND GEN) 32G X 4 MM MISC Use TID with insulin pens   losartan (COZAAR) 25 MG tablet TAKE 1 TABLET(25 MG) BY MOUTH DAILY   simvastatin (ZOCOR) 40 MG tablet TAKE 1 TABLET(40 MG) BY MOUTH DAILY AT 6 PM   meloxicam (MOBIC) 15 MG tablet Take 15 mg by mouth daily. (Patient not taking: Reported on 03/23/2023)   No facility-administered encounter medications on file as of 03/23/2023.    Allergies (verified) Patient has no known allergies.   History: Past Medical History:  Diagnosis Date   Diabetes mellitus without complication (HCC)    Essential hypertension 10/16/2015   History of oral surgery  Hyperlipidemia    OA (osteoarthritis) of hip 11/05/2015   Obese 10/16/2015   Smoking    Vitamin D deficiency 11/05/2015   Past Surgical History:  Procedure Laterality Date   CATARACT EXTRACTION W/PHACO Left 08/12/2020   Procedure: CATARACT EXTRACTION PHACO AND INTRAOCULAR LENS PLACEMENT (IOC) LEFT DIABETIC PANOPTIX  TORIC LENS;  Surgeon: Nevada Crane, MD;  Location: [facility];  Service: Ophthalmology;  Laterality: Left;  3.75 0:30.1    CATARACT EXTRACTION W/PHACO Right 08/26/2020   Procedure: CATARACT EXTRACTION PHACO AND INTRAOCULAR LENS PLACEMENT (IOC) RIGHT DIABETIC PANOPTIX  LENS 2.82 00:26.1;  Surgeon: Nevada Crane, MD;  Location: [facility];  Service: Ophthalmology;  Laterality: Right;  Diabetic   Family History  Problem Relation Age of Onset   Diabetes Mother    Congestive Heart Failure Mother    Cancer Sister        breast   Cancer Sister        cervical   Heart attack Brother    Social History   Socioeconomic History   Marital status: Widowed    Spouse name: Not on file   Number of children: Not on file   Years of education: Not on file   Highest education level: Associate degree: academic program  Occupational History   Not on file  Tobacco Use   Smoking status: Every Day    Current packs/day: 1.00    Average packs/day: 1 pack/day for 25.0 years (25.0 ttl pk-yrs)    Types: Cigarettes   Smokeless tobacco: Current    Types: Chew  Vaping Use   Vaping status: Never Used  Substance and Sexual Activity   Alcohol use: Not Currently    Alcohol/week: 0.0 standard drinks of alcohol   Drug use: No   Sexual activity: Never  Other Topics Concern   Not on file  Social History Narrative   Not on file   Social Determinants of Health   Financial Resource Strain: Low Risk  (03/19/2023)   Overall Financial Resource Strain (CARDIA)    Difficulty of Paying Living Expenses: Not hard at all  Food Insecurity: No Food Insecurity (03/19/2023)   Hunger Vital Sign    Worried About Running Out of Food in the Last Year: Never true    Ran Out of Food in the Last Year: Never true  Transportation Needs: No Transportation Needs (03/19/2023)   PRAPARE - Administrator, Civil Service (Medical): No    Lack of Transportation (Non-Medical): No  Physical Activity: Patient Declined (03/19/2023)   Exercise Vital Sign    Days of Exercise per Week: Patient declined    Minutes of Exercise per Session:  Patient declined  Stress: No Stress Concern Present (03/19/2023)   [employer] of Occupational Health - Occupational Stress Questionnaire    Feeling of Stress : Not at all  Social Connections: Unknown (03/19/2023)   Social Connection and Isolation Panel [NHANES]    Frequency of Communication with Friends and Family: More than three times a week    Frequency of Social Gatherings with Friends and Family: Twice a week    Attends Religious Services: Patient declined    Database administrator or Organizations: No    Attends Banker Meetings: Patient declined    Marital Status: Widowed    Tobacco Counseling Ready to quit: Not Answered Counseling given: Not Answered   Clinical Intake:  Pre-visit preparation completed: Yes  Pain : No/denies pain     Nutritional Risks:  None Diabetes: Yes CBG done?: No Did pt. bring in CBG monitor from home?: No  How often do you need to have someone help you when you read instructions, pamphlets, or other written materials from your doctor or pharmacy?: 1 - Never  Interpreter Needed?: No  Information entered by :: NAllen LPN   Activities of Daily Living    03/19/2023   11:59 AM  In your present state of health, do you have any difficulty performing the following activities:  Hearing? 0  Vision? 0  Difficulty concentrating or making decisions? 0  Walking or climbing stairs? 0  Dressing or bathing? 0  Doing errands, shopping? 0  Preparing Food and eating ? N  Using the Toilet? N  In the past six months, have you accidently leaked urine? N  Do you have problems with loss of bowel control? N  Managing your Medications? N  Managing your Finances? N  Housekeeping or managing your Housekeeping? N    Patient Care Team: [name], MD as PCP - General (Family Medicine) Dingeldein, Viviann Spare, MD (Ophthalmology)  Indicate any recent Medical Services you may have received from other than [facility] providers in the past year  (date may be approximate).     Assessment:   This is a routine wellness examination for Jose Munoz.  Hearing/Vision screen Hearing Screening - Comments:: Denies hearing issues Vision Screening - Comments:: Regular eye exams, [facility]   Goals Addressed             This Visit's Progress    Patient Stated       03/23/2023, get left knee repaired eventually       Depression Screen    03/23/2023    8:32 AM 12/18/2022   10:00 AM 03/19/2022   10:04 AM 11/20/2021   10:51 AM 06/02/2021    8:11 AM 02/04/2021    1:56 PM 09/24/2020    8:54 AM  PHQ 2/9 Scores  PHQ - 2 Score 0 0 0 0 0 0 0  PHQ- 9 Score 0 0 0 0 0 0 0    Fall Risk    03/19/2023   11:59 AM 12/18/2022   10:00 AM 03/19/2022   10:04 AM 06/02/2021    8:10 AM 02/04/2021    1:53 PM  Fall Risk   Falls in the past year? 0 0 1 0 0  Number falls in past yr: 0 0 0 0 0  Injury with Fall? 0 0 0 0 0  Risk for fall due to : Medication side effect No Fall Risks  No Fall Risks No Fall Risks  Follow up Falls prevention discussed;Falls evaluation completed Falls evaluation completed  Falls evaluation completed Falls evaluation completed    MEDICARE RISK AT HOME: Medicare Risk at Home Any stairs in or around the home?: Yes If so, are there any without handrails?: No Home free of loose throw rugs in walkways, pet beds, electrical cords, etc?: Yes Adequate lighting in your home to reduce risk of falls?: Yes Life alert?: No Use of a cane, walker or w/c?: Yes Grab bars in the bathroom?: No Shower chair or bench in shower?: No Elevated toilet seat or a handicapped toilet?: Yes  TIMED UP AND GO:  Was the test performed?  No    Cognitive Function:        03/23/2023    8:33 AM 03/19/2022   10:03 AM 02/04/2021    1:37 PM 05/26/2019    9:15 AM 05/17/2017    8:28  AM  6CIT Screen  What Year? 0 points 0 points 0 points 0 points 0 points  What month? 0 points 0 points 0 points 0 points 0 points  What time? 0 points 0 points 0 points 0  points 0 points  Count back from 20 0 points 0 points 0 points 0 points 0 points  Months in reverse 0 points 0 points 0 points 0 points 0 points  Repeat phrase 2 points 0 points 0 points 0 points 0 points  Total Score 2 points 0 points 0 points 0 points 0 points    Immunizations Immunization History  Administered Date(s) Administered   Fluad Quad(high Dose 65+) 02/04/2021, 03/19/2022   Fluad Trivalent(High Dose 65+) 02/18/2023   Influenza, High Dose Seasonal PF 02/21/2016, 05/17/2017, 04/12/2018, 04/02/2020   Influenza-Unspecified 02/27/2010, 03/23/2011, 02/10/2012, 04/12/2013, 05/04/2014   PFIZER(Purple Top)SARS-COV-2 Vaccination 06/24/2019, 07/19/2019, 03/05/2020   Pneumococcal Conjugate-13 09/09/2016   Pneumococcal-Unspecified 05/04/2014   Tdap 01/03/2009, 06/13/2019   Zoster Recombinant(Shingrix) 06/04/2019   Zoster, Live 03/23/2011    TDAP status: Up to date  Flu Vaccine status: Up to date  Pneumococcal vaccine status: Up to date  Covid-19 vaccine status: Information provided on how to obtain vaccines.   Qualifies for Shingles Vaccine? Yes   Zostavax completed Yes   Shingrix Completed?: needs second dose  Screening Tests Health Maintenance  Topic Date Due   Hepatitis C Screening  Never done   Zoster Vaccines- Shingrix (2 of 2) 07/30/2019   COVID-19 Vaccine (4 - 2023-24 season) 04/08/2023 (Originally 01/17/2023)   Lung Cancer Screening  07/20/2023 (Originally 06/30/1998)   Pneumonia Vaccine 29+ Years old (2 of 2 - PPSV23 or PCV20) 07/20/2023 (Originally 11/04/2016)   Colonoscopy  07/20/2023 (Originally 06/30/1993)   Diabetic kidney evaluation - Urine ACR  08/26/2023   HEMOGLOBIN A1C  09/14/2023   OPHTHALMOLOGY EXAM  10/15/2023   Diabetic kidney evaluation - eGFR measurement  12/18/2023   FOOT EXAM  12/18/2023   Medicare Annual Wellness (AWV)  03/22/2024   DTaP/Tdap/Td (3 - Td or Tdap) 06/12/2029   INFLUENZA VACCINE  Completed   HPV VACCINES  Aged Out    Health  Maintenance  Health Maintenance Due  Topic Date Due   Hepatitis C Screening  Never done   Zoster Vaccines- Shingrix (2 of 2) 07/30/2019    Colorectal cancer screening: No longer required.   Lung Cancer Screening: (Low Dose CT Chest recommended if Age 105-80 years, 20 pack-year currently smoking OR have quit w/in 15years.) does qualify.   Lung Cancer Screening Referral: declines  Additional Screening:  Hepatitis C Screening: does qualify;   Vision Screening: Recommended annual ophthalmology exams for early detection of glaucoma and other disorders of the eye. Is the patient up to date with their annual eye exam?  Yes  Who is the provider or what is the name of the office in which the patient attends annual eye exams? [facility] If pt is not established with a provider, would they like to be referred to a provider to establish care? No .   Dental Screening: Recommended annual dental exams for proper oral hygiene  Diabetic Foot Exam: Diabetic Foot Exam: Completed 12/18/2022  Community Resource Referral / Chronic Care Management: CRR required this visit?  No   CCM required this visit?  No     Plan:     I have personally reviewed and noted the following in the patient's chart:   Medical and social history Use of alcohol, tobacco or illicit  drugs  Current medications and supplements including opioid prescriptions. Patient is not currently taking opioid prescriptions. Functional ability and status Nutritional status Physical activity Advanced directives List of other physicians Hospitalizations, surgeries, and ER visits in previous 12 months Vitals Screenings to include cognitive, depression, and falls Referrals and appointments  In addition, I have reviewed and discussed with patient certain preventive protocols, quality metrics, and best practice recommendations. A written personalized care plan for preventive services as well as general preventive health  recommendations were provided to patient.     Barb Merino, LPN   16/05/958   After Visit Summary: (MyChart) Due to this being a telephonic visit, the after visit summary with patients personalized plan was offered to patient via MyChart   Nurse Notes: none

## 2023-03-23 NOTE — Patient Instructions (Addendum)
Jose Munoz , Thank you for taking time to come for your Medicare Wellness Visit. I appreciate your ongoing commitment to your health goals. Please review the following plan we discussed and let me know if I can assist you in the future.   Referrals/Orders/Follow-Ups/Clinician Recommendations: none  This is a list of the screening recommended for you and due dates:  Health Maintenance  Topic Date Due   Hepatitis C Screening  Never done   Zoster (Shingles) Vaccine (2 of 2) 07/30/2019   COVID-19 Vaccine (4 - 2023-24 season) 04/08/2023*   Screening for Lung Cancer  07/20/2023*   Pneumonia Vaccine (2 of 2 - PPSV23 or PCV20) 07/20/2023*   Colon Cancer Screening  07/20/2023*   Yearly kidney health urinalysis for diabetes  08/26/2023   Hemoglobin A1C  09/14/2023   Eye exam for diabetics  10/15/2023   Yearly kidney function blood test for diabetes  12/18/2023   Complete foot exam   12/18/2023   Medicare Annual Wellness Visit  03/22/2024   DTaP/Tdap/Td vaccine (3 - Td or Tdap) 06/12/2029   Flu Shot  Completed   HPV Vaccine  Aged Out  *Topic was postponed. The date shown is not the original due date.    Advanced directives: (Copy Requested) Please bring a copy of your health care power of attorney and living will to the office to be added to your chart at your convenience.  Next Medicare Annual Wellness Visit scheduled for next year: No, schedule not open for next year  insert Preventive Care attachment Insert FALL PREVENTION attachment if needed

## 2023-04-19 ENCOUNTER — Encounter: Payer: Self-pay | Admitting: Family Medicine

## 2023-04-19 ENCOUNTER — Ambulatory Visit (INDEPENDENT_AMBULATORY_CARE_PROVIDER_SITE_OTHER): Payer: PPO | Admitting: Family Medicine

## 2023-04-19 VITALS — BP 117/66 | HR 80 | Ht 70.0 in | Wt 214.4 lb

## 2023-04-19 DIAGNOSIS — Z794 Long term (current) use of insulin: Secondary | ICD-10-CM

## 2023-04-19 DIAGNOSIS — E119 Type 2 diabetes mellitus without complications: Secondary | ICD-10-CM

## 2023-04-19 DIAGNOSIS — M1712 Unilateral primary osteoarthritis, left knee: Secondary | ICD-10-CM

## 2023-04-19 NOTE — Progress Notes (Unsigned)
   Established Patient Office Visit  Subjective   Patient ID: Jose Munoz, male    DOB: 06/19/1950  Age: 72 y.o. MRN: [id number]  Chief Complaint  Patient presents with   Medical Management of Chronic Issues    HPI  DM2-patient is taking Jose Munoz, Humalog.  He has freestyle libre.  We synced up his freestyle libre to our office during this appointment.  We went over his A1c value.  We discussed patient's medications for his knee pain.  Left knee pain is worse due to standing on it all day cooking things getting dinner.  We discussed Celebrex and safely using Celebrex.  Using it only as needed but up to 200 mg daily.  Patient asked about castor oil topically.  Discussed how it is unlikely to be harmful but benefit would be uncertain.  The ASCVD Risk score (Arnett DK, et al., 2019) failed to calculate for the following reasons:   The valid total cholesterol range is 130 to 320 mg/dL  Health Maintenance Due  Topic Date Due   Hepatitis C Screening  Never done   Zoster Vaccines- Shingrix (2 of 2) 07/30/2019   COVID-19 Vaccine (4 - 2023-24 season) 01/17/2023      Objective:     BP 117/66   Pulse 80   Ht 5\' 10"  (1.778 m)   Wt 214 lb 6.4 oz (97.3 kg)   SpO2 95%   BMI 30.76 kg/m  {Vitals History (Optional):23777}  Physical Exam General: Alert, oriented CV: Regular rate and rhythm no murmurs Pulmonary: Lungs clear bilaterally    No results found for any visits on 04/19/23.      Assessment & Plan:   Diabetes mellitus without complication (HCC) Assessment & Plan: A1c was 6.9.  Patient is well-controlled on his current medications.  We looked at his freestyle libre results through [place] link.  He is in range 97% of the time, which is excellent.  No hypoglycemic periods.  Follow-up in 3 months.  Advised patient he can follow-up every 6 months as long as it continues to be well-controlled.   Primary osteoarthritis of left knee Assessment & Plan: Pain is little  bit worse due to overuse.  Has an orthopedist.  Gets gel and steroid injections occasionally.  Has been advised he needs a replacement at some point.  Discussed Celebrex.  Advised him he can use it 200 mg daily but not to exceed that.  Try to use it only as needed.  Discussed over-the-counter topicals being generally safe.      Return in about 3 months (around 07/18/2023) for DM.    [name], MD

## 2023-04-19 NOTE — Patient Instructions (Signed)
It was nice to see you today,  We addressed the following topics today: -You can take your Celebrex as needed.  Do not take more than once a day.  If you take this medication daily, chronically it can increase your risk of cardiovascular disease or kidney injury, similar to other NSAIDs including ibuprofen and Aleve.  The benefit of this medication is that it has less risk of stomach bleeding.  Have a great day,  Frederic Jericho, MD

## 2023-04-19 NOTE — Assessment & Plan Note (Signed)
Pain is little bit worse due to overuse.  Has an orthopedist.  Gets gel and steroid injections occasionally.  Has been advised he needs a replacement at some point.  Discussed Celebrex.  Advised him he can use it 200 mg daily but not to exceed that.  Try to use it only as needed.  Discussed over-the-counter topicals being generally safe.

## 2023-04-19 NOTE — Assessment & Plan Note (Signed)
A1c was 6.9.  Patient is well-controlled on his current medications.  We looked at his freestyle libre results through [place] link.  He is in range 97% of the time, which is excellent.  No hypoglycemic periods.  Follow-up in 3 months.  Advised patient he can follow-up every 6 months as long as it continues to be well-controlled.

## 2023-06-02 ENCOUNTER — Other Ambulatory Visit: Payer: Self-pay | Admitting: Family Medicine

## 2023-06-02 DIAGNOSIS — I10 Essential (primary) hypertension: Secondary | ICD-10-CM

## 2023-06-16 ENCOUNTER — Other Ambulatory Visit: Payer: Self-pay | Admitting: Family Medicine

## 2023-06-16 DIAGNOSIS — E1165 Type 2 diabetes mellitus with hyperglycemia: Secondary | ICD-10-CM

## 2023-06-17 ENCOUNTER — Other Ambulatory Visit: Payer: Self-pay | Admitting: Family Medicine

## 2023-06-17 DIAGNOSIS — E1165 Type 2 diabetes mellitus with hyperglycemia: Secondary | ICD-10-CM

## 2023-07-19 ENCOUNTER — Encounter: Payer: Self-pay | Admitting: Family Medicine

## 2023-07-19 ENCOUNTER — Ambulatory Visit (INDEPENDENT_AMBULATORY_CARE_PROVIDER_SITE_OTHER): Payer: PPO | Admitting: Family Medicine

## 2023-07-19 VITALS — BP 114/69 | HR 95 | Ht 70.0 in | Wt 219.0 lb

## 2023-07-19 DIAGNOSIS — E119 Type 2 diabetes mellitus without complications: Secondary | ICD-10-CM

## 2023-07-19 DIAGNOSIS — Z7985 Long-term (current) use of injectable non-insulin antidiabetic drugs: Secondary | ICD-10-CM

## 2023-07-19 LAB — POCT GLYCOSYLATED HEMOGLOBIN (HGB A1C): HbA1c POC (<> result, manual entry): 6.5 % (ref 4.0–5.6)

## 2023-07-19 MED ORDER — BLOOD GLUCOSE TEST VI STRP: 1.0000 | ORAL_STRIP | Freq: Three times a day (TID) | 0 refills | Status: AC

## 2023-07-19 MED ORDER — LANCETS MISC. MISC: 1.0000 | Freq: Three times a day (TID) | 0 refills | Status: AC

## 2023-07-19 MED ORDER — TRESIBA FLEXTOUCH 200 UNIT/ML ~~LOC~~ SOPN: 42.0000 [IU] | PEN_INJECTOR | Freq: Every day | SUBCUTANEOUS | 4 refills | Status: AC

## 2023-07-19 MED ORDER — LANCET DEVICE MISC: 1.0000 | Freq: Three times a day (TID) | 0 refills | Status: AC

## 2023-07-19 MED ORDER — SYNJARDY XR 12.5-1000 MG PO TB24: 2.0000 | ORAL_TABLET | Freq: Every day | ORAL | 3 refills | Status: AC

## 2023-07-19 MED ORDER — INSULIN LISPRO (1 UNIT DIAL) 100 UNIT/ML (KWIKPEN): PEN_INJECTOR | SUBCUTANEOUS | 2 refills | Status: DC

## 2023-07-19 MED ORDER — BLOOD GLUCOSE MONITORING SUPPL DEVI: 1.0000 | Freq: Three times a day (TID) | 0 refills | Status: AC

## 2023-07-19 NOTE — Patient Instructions (Signed)
 It was nice to see you today,  We addressed the following topics today: Your A1c was 6.5% -I am sending in your prescriptions to your pharmacy. - I am also sending in a point-of-care glucometer.  If you get a low reading on your freestyle Josephine Igo you should confirm it with a point-of-care fingerstick.  These tend to be more accurate.  Have a great day,  Frederic Jericho, MD

## 2023-07-19 NOTE — Progress Notes (Signed)
   Established Patient Office Visit  Subjective   Patient ID: Jose Munoz, male    DOB: [birth year]  Age: 73 y.o. MRN: [id number]  Chief Complaint  Patient presents with   Medical Management of Chronic Issues    HPI  DM2-patient still taking his [name] and Humalog.  Patient still using his freestyle libre.  Has some hypoglycemic episodes.  Had 1 instance where it was in the 50s he then drink orange juice and checked it 15 minutes later and it was still the same.  Believes this was a error in his machine.  Does not have all the available equipment for a point-of-care fingerstick glucometer.  Knee pain-pain still bothers him but he has learned to live with this pain.   The ASCVD Risk score (Arnett DK, et al., 2019) failed to calculate for the following reasons:   The valid total cholesterol range is 130 to 320 mg/dL  Health Maintenance Due  Topic Date Due   Hepatitis C Screening  Never done   Zoster Vaccines- Shingrix (2 of 2) 07/30/2019   COVID-19 Vaccine (4 - 2024-25 season) 01/17/2023      Objective:     BP 114/69   Pulse 95   Ht 5\' 10"  (1.778 m)   Wt 219 lb (99.3 kg)   SpO2 95%   BMI 31.42 kg/m    Physical Exam  General: Alert, oriented Pulmonary: No respiratory distress Psych: Pleasant affect   No results found for any visits on 07/19/23.      Assessment & Plan:   Diabetes mellitus without complication (HCC) Assessment & Plan: A1c still well-controlled.  Has some hypoglycemic events but when I looked at has most recent CGM readings on libre view there was only 15 minutes of blood sugars below 55 over the past few weeks.  These generally occur at nighttime when he is sleeping.  Orders: -     Blood Glucose Monitoring Suppl; 1 each by Does not apply route in the morning, at noon, and at bedtime. May substitute to any manufacturer covered by patient's insurance.  Dispense: 1 each; Refill: 0 -     Blood Glucose Test; 1 each by In Vitro route in the  morning, at noon, and at bedtime. May substitute to any manufacturer covered by patient's insurance.  Dispense: 100 strip; Refill: 0 -     Lancet Device; 1 each by Does not apply route in the morning, at noon, and at bedtime. May substitute to any manufacturer covered by patient's insurance.  Dispense: 1 each; Refill: 0 -     Lancets Misc.; 1 each by Does not apply route in the morning, at noon, and at bedtime. May substitute to any manufacturer covered by patient's insurance.  Dispense: 100 each; Refill: 0 -     Tresiba FlexTouch; Inject 42 Units into the skin daily at 6 (six) AM.  Dispense: 15 mL; Refill: 4 -     Insulin Lispro (1 Unit Dial); ADMINISTER 8 TO 14 UNITS UNDER THE SKIN DAILY BEFORE MEALS  Dispense: 15 mL; Refill: 2 -     Synjardy XR; Take 2 tablets by mouth daily.  Dispense: 180 tablet; Refill: 3     Return in about 3 months (around 10/19/2023) for DM.    [name], MD

## 2023-07-19 NOTE — Assessment & Plan Note (Signed)
 A1c still well-controlled.  Has some hypoglycemic events but when I looked at has most recent CGM readings on libre view there was only 15 minutes of blood sugars below 55 over the past few weeks.  These generally occur at nighttime when he is sleeping.

## 2023-09-03 ENCOUNTER — Other Ambulatory Visit: Payer: Self-pay | Admitting: Family Medicine

## 2023-09-03 DIAGNOSIS — E1169 Type 2 diabetes mellitus with other specified complication: Secondary | ICD-10-CM

## 2023-09-07 ENCOUNTER — Telehealth: Payer: Self-pay | Admitting: *Deleted

## 2023-09-07 NOTE — Telephone Encounter (Signed)
 Contacted pt and let them know that his medication was sent in yesterday.

## 2023-09-07 NOTE — Telephone Encounter (Signed)
 Copied from CRM [id number]. Topic: Clinical - Prescription Issue >> Sep 07, 2023 10:47 AM Zipporah Him wrote: Reason for CRM: Patient is trying to refill pharmacy has no record, please call patient if there are any issues with refill.He does have a few left still so no rush just making sure it gets completed. >> Sep 07, 2023 10:50 AM Zipporah Him wrote: simvastatin  (ZOCOR ) 40 MG tablet is medication trying to refill, forgot to note

## 2023-09-23 DIAGNOSIS — M1712 Unilateral primary osteoarthritis, left knee: Secondary | ICD-10-CM | POA: Diagnosis not present

## 2023-09-27 ENCOUNTER — Other Ambulatory Visit: Payer: Self-pay | Admitting: Family Medicine

## 2023-09-27 DIAGNOSIS — I10 Essential (primary) hypertension: Secondary | ICD-10-CM

## 2023-09-30 DIAGNOSIS — M1712 Unilateral primary osteoarthritis, left knee: Secondary | ICD-10-CM | POA: Diagnosis not present

## 2023-10-07 DIAGNOSIS — M1712 Unilateral primary osteoarthritis, left knee: Secondary | ICD-10-CM | POA: Diagnosis not present

## 2023-10-20 ENCOUNTER — Encounter: Payer: Self-pay | Admitting: Family Medicine

## 2023-10-20 ENCOUNTER — Ambulatory Visit (INDEPENDENT_AMBULATORY_CARE_PROVIDER_SITE_OTHER): Admitting: Family Medicine

## 2023-10-20 VITALS — BP 118/73 | HR 78 | Ht 70.0 in | Wt 216.8 lb

## 2023-10-20 DIAGNOSIS — Z7985 Long-term (current) use of injectable non-insulin antidiabetic drugs: Secondary | ICD-10-CM

## 2023-10-20 DIAGNOSIS — M1712 Unilateral primary osteoarthritis, left knee: Secondary | ICD-10-CM

## 2023-10-20 DIAGNOSIS — E119 Type 2 diabetes mellitus without complications: Secondary | ICD-10-CM | POA: Diagnosis not present

## 2023-10-20 DIAGNOSIS — Z7984 Long term (current) use of oral hypoglycemic drugs: Secondary | ICD-10-CM

## 2023-10-20 DIAGNOSIS — Z794 Long term (current) use of insulin: Secondary | ICD-10-CM

## 2023-10-20 MED ORDER — FREESTYLE LIBRE 2 PLUS SENSOR MISC: 1.0000 | Freq: Every day | 3 refills | Status: DC

## 2023-10-20 NOTE — Patient Instructions (Signed)
 It was nice to see you today,  We addressed the following topics today: -Your A1c was 6.5. - I am sending in your freestyle libre 2+ monitor.  If you have any issues getting this I will just send in the 3 or 3+  Have a great day,  Etha Henle, MD

## 2023-10-20 NOTE — Assessment & Plan Note (Signed)
-   Recently received hyaluronic acid and cortisone injections at [facility] with good response - Using ibuprofen  with good effect - Limiting golf to once weekly due to knee pain

## 2023-10-20 NOTE — Progress Notes (Unsigned)
   Established Patient Office Visit  Subjective   Patient ID: Jose Munoz, male    DOB: 1951-01-15  Age: 73 y.o. MRN: [id number]  Chief Complaint  Patient presents with   Medical Management of Chronic Issues    HPI  Subjective - Diabetes follow-up - No new concerns or issues - Reports pharmacy having difficulty obtaining Freestyle Libre 2 sensors as they are being phased out - Discussed switching to [name] sensors (15-day wear vs 14-day with current sensor) - Discussed potential future switch to [name] if needed - Reports previous technical difficulties with [employer] app requiring re-registration - Son recently diagnosed with elevated A1C of 7.0 - Reports family history of diabetes (mother and two brothers) - Reports knee pain, received hyaluronic acid injection and cortisone injection at [facility] - Reports knee feeling better since injections - Reports ibuprofen  works best for knee pain - Reports playing golf once weekly due to knee limitations - Reports recent golf trip to [employer] [place]  Medications: Freestyle Libre 2 sensor (continuous glucose monitoring)  PMH, PSH, FH, Social Hx: Type 2 diabetes mellitus, knee osteoarthritis, family history of diabetes (mother and two brothers have diabetes)  ROS: Musculoskeletal: knee pain improved after recent injections   The ASCVD Risk score (Arnett DK, et al., 2019) failed to calculate for the following reasons:   The valid total cholesterol range is 130 to 320 mg/dL  Health Maintenance Due  Topic Date Due   Hepatitis C Screening  Never done   COVID-19 Vaccine (4 - 2024-25 season) 01/17/2023   Diabetic kidney evaluation - Urine ACR  08/26/2023   OPHTHALMOLOGY EXAM  10/15/2023      Objective:     BP 118/73   Pulse 78   Ht 5\' 10"  (1.778 m)   Wt 216 lb 12 oz (98.3 kg)   SpO2 96%   BMI 31.10 kg/m  {Vitals History (Optional):23777}  Physical Exam Gen: alert, oriented Pulm: no resp  distress Ext: normal diabetic foot exam.     No results found for any visits on 10/20/23.      Assessment & Plan:   Diabetes mellitus without complication (HCC) Assessment & Plan: - A1C 6.5%, stable from 3 months ago, improved from 7 months ago - Well-controlled for over a year - Will switch from [name] to [name] sensors due to availability issues - Will collect urine protein test at next visit - Will follow up in 3 months for annual physical, then transition to 46-month follow-ups   Primary osteoarthritis of left knee Assessment & Plan: - Recently received hyaluronic acid and cortisone injections at [facility] with good response - Using ibuprofen  with good effect - Limiting golf to once weekly due to knee pain   Other orders -     FreeStyle Libre 2 Plus Sensor; 1 Application by Does not apply route daily. Replace every 15 days  Dispense: 2 each; Refill: 3     Return in about 3 months (around 01/20/2024) for physical.    Laneta Pintos, MD

## 2023-10-20 NOTE — Assessment & Plan Note (Signed)
-   A1C 6.5%, stable from 3 months ago, improved from 7 months ago - Well-controlled for over a year - Will switch from [name] to [name] sensors due to availability issues - Will collect urine protein test at next visit - Will follow up in 3 months for annual physical, then transition to 75-month follow-ups

## 2023-10-21 DIAGNOSIS — E119 Type 2 diabetes mellitus without complications: Secondary | ICD-10-CM | POA: Diagnosis not present

## 2023-10-21 LAB — HM DIABETES EYE EXAM

## 2023-12-06 ENCOUNTER — Other Ambulatory Visit: Payer: Self-pay | Admitting: Family Medicine

## 2023-12-06 DIAGNOSIS — E119 Type 2 diabetes mellitus without complications: Secondary | ICD-10-CM

## 2024-01-20 ENCOUNTER — Other Ambulatory Visit

## 2024-01-20 DIAGNOSIS — E119 Type 2 diabetes mellitus without complications: Secondary | ICD-10-CM | POA: Diagnosis not present

## 2024-01-21 ENCOUNTER — Ambulatory Visit: Payer: Self-pay

## 2024-01-21 LAB — MICROALBUMIN / CREATININE URINE RATIO
Creatinine, Urine: 86.9 mg/dL
Microalb/Creat Ratio: 551 mg/g{creat} — ABNORMAL HIGH (ref 0–29)
Microalbumin, Urine: 478.4 ug/mL

## 2024-01-21 LAB — CBC WITH DIFFERENTIAL/PLATELET
Basophils Absolute: 0.1 x10E3/uL (ref 0.0–0.2)
Basos: 1 %
EOS (ABSOLUTE): 0.8 x10E3/uL — ABNORMAL HIGH (ref 0.0–0.4)
Eos: 7 %
Hematocrit: 56.1 % — ABNORMAL HIGH (ref 37.5–51.0)
Hemoglobin: 18.5 g/dL — ABNORMAL HIGH (ref 13.0–17.7)
Immature Grans (Abs): 0 x10E3/uL (ref 0.0–0.1)
Immature Granulocytes: 0 %
Lymphocytes Absolute: 4.6 x10E3/uL — ABNORMAL HIGH (ref 0.7–3.1)
Lymphs: 42 %
MCH: 31.1 pg (ref 26.6–33.0)
MCHC: 33 g/dL (ref 31.5–35.7)
MCV: 94 fL (ref 79–97)
Monocytes Absolute: 0.8 x10E3/uL (ref 0.1–0.9)
Monocytes: 7 %
Neutrophils Absolute: 4.7 x10E3/uL (ref 1.4–7.0)
Neutrophils: 43 %
Platelets: 318 x10E3/uL (ref 150–450)
RBC: 5.94 x10E6/uL — ABNORMAL HIGH (ref 4.14–5.80)
RDW: 13.1 % (ref 11.6–15.4)
WBC: 11 x10E3/uL — ABNORMAL HIGH (ref 3.4–10.8)

## 2024-01-21 LAB — COMPREHENSIVE METABOLIC PANEL WITH GFR
ALT: 18 IU/L (ref 0–44)
AST: 13 IU/L (ref 0–40)
Albumin: 4.5 g/dL (ref 3.8–4.8)
Alkaline Phosphatase: 89 IU/L (ref 44–121)
BUN/Creatinine Ratio: 24 (ref 10–24)
BUN: 18 mg/dL (ref 8–27)
Bilirubin Total: 0.4 mg/dL (ref 0.0–1.2)
CO2: 22 mmol/L (ref 20–29)
Calcium: 9.8 mg/dL (ref 8.6–10.2)
Chloride: 98 mmol/L (ref 96–106)
Creatinine, Ser: 0.76 mg/dL (ref 0.76–1.27)
Globulin, Total: 3.6 g/dL (ref 1.5–4.5)
Glucose: 109 mg/dL — ABNORMAL HIGH (ref 70–99)
Potassium: 4.4 mmol/L (ref 3.5–5.2)
Sodium: 138 mmol/L (ref 134–144)
Total Protein: 8.1 g/dL (ref 6.0–8.5)
eGFR: 94 mL/min/1.73 (ref >59)

## 2024-01-21 LAB — LIPID PANEL
Chol/HDL Ratio: 4 ratio (ref 0.0–5.0)
Cholesterol, Total: 136 mg/dL (ref 100–199)
HDL: 34 mg/dL — ABNORMAL LOW (ref >39)
LDL Chol Calc (NIH): 79 mg/dL (ref 0–99)
Triglycerides: 129 mg/dL (ref 0–149)
VLDL Cholesterol Cal: 23 mg/dL (ref 5–40)

## 2024-01-21 LAB — HEMOGLOBIN A1C
Est. average glucose Bld gHb Est-mCnc: 160 mg/dL
Hgb A1c MFr Bld: 7.2 % — ABNORMAL HIGH (ref 4.8–5.6)

## 2024-01-27 ENCOUNTER — Ambulatory Visit (INDEPENDENT_AMBULATORY_CARE_PROVIDER_SITE_OTHER): Admitting: Family Medicine

## 2024-01-27 ENCOUNTER — Encounter: Payer: Self-pay | Admitting: Family Medicine

## 2024-01-27 VITALS — BP 127/86 | HR 82 | Ht 70.0 in | Wt 217.0 lb

## 2024-01-27 DIAGNOSIS — Z0000 Encounter for general adult medical examination without abnormal findings: Secondary | ICD-10-CM | POA: Diagnosis not present

## 2024-01-27 DIAGNOSIS — Z716 Tobacco abuse counseling: Secondary | ICD-10-CM

## 2024-01-27 DIAGNOSIS — Z23 Encounter for immunization: Secondary | ICD-10-CM | POA: Diagnosis not present

## 2024-01-27 DIAGNOSIS — M1712 Unilateral primary osteoarthritis, left knee: Secondary | ICD-10-CM | POA: Diagnosis not present

## 2024-01-27 DIAGNOSIS — D751 Secondary polycythemia: Secondary | ICD-10-CM | POA: Insufficient documentation

## 2024-01-27 DIAGNOSIS — E119 Type 2 diabetes mellitus without complications: Secondary | ICD-10-CM

## 2024-01-27 DIAGNOSIS — Z794 Long term (current) use of insulin: Secondary | ICD-10-CM | POA: Diagnosis not present

## 2024-01-27 DIAGNOSIS — R809 Proteinuria, unspecified: Secondary | ICD-10-CM | POA: Diagnosis not present

## 2024-01-27 DIAGNOSIS — E1129 Type 2 diabetes mellitus with other diabetic kidney complication: Secondary | ICD-10-CM

## 2024-01-27 MED ORDER — NICOTINE POLACRILEX 2 MG MT LOZG: 2.0000 mg | LOZENGE | OROMUCOSAL | 0 refills | Status: AC | PRN

## 2024-01-27 MED ORDER — VARENICLINE TARTRATE (STARTER) 0.5 MG X 11 & 1 MG X 42 PO TBPK: ORAL_TABLET | ORAL | 0 refills | Status: AC

## 2024-01-27 NOTE — Assessment & Plan Note (Signed)
-   Patient has chronic bilateral knee pain managed with ibuprofen . Has received prior injections. Considering total knee arthroplasty but is hesitant due to social situation and recovery logistics. - Counseled that recovery from surgery will only become more difficult with increasing age and encouraged him to proceed sooner rather than later if he has decided on surgery.

## 2024-01-27 NOTE — Progress Notes (Unsigned)
 Annual physical  Subjective   Patient ID: Jose Munoz, male    DOB: [birth year]  Age: 73 y.o. MRN: [id number]  Chief Complaint  Patient presents with   Annual Exam   HPI Jose Munoz is a 73 y.o. old male here  for annual exam.   Subjective - Follow-up for diabetes and lab results. Reports being diligent with insulin  and diet. Expressed surprise at recent [facility] A1c of 7.2%, as it has not been over 7 in several years. Recent history of A1c: 6.5 (3 and 6 months ago), 6.9 (10 months ago), 7.1 (1 year ago). Uses Freestyle Libre 2 Plus, which indicates good control. - Concern about kidney function results. Noted several results were out of whack on MyChart. Specifically concerned about high microalbumin. - Reports elevated white blood cell count and hemoglobin on recent labs. - Reports bilateral knee pain, worse in the morning but improves throughout the day. Manages with ibuprofen . Pain interferes with playing golf. Received hyaluronic acid and cortisone injections in one knee in May for his granddaughter's wedding. Considers knee replacement surgery over the winter but is hesitant due to being a widower, living alone with a dog, and the need for post-op rehab with family in [place]. - Expressed interest in smoking cessation.  Medications: Losartan , empagliflozin (Jardiance/Synjardy ), aspirin, Tresiba  42 units daily, short-acting insulin  with meals.  PMH, PSH, FH, Social Hx: PMHx: Diabetes Mellitus type 2, hypertension, elevated microalbuminuria, borderline high hemoglobin, bilateral knee osteoarthritis. No history of sleep apnea. PSH: No surgeries mentioned. FH: Wife is deceased. Social Hx: 29 year old widower. Lives alone with a dog. Smokes cigarettes. Denies excessive alcohol use. Has a son and daughter who live in [place]. Dropped golf club membership due to knee pain.  ROS: Constitutional: Denies daytime fatigue or somnolence. GU: Reports nocturia. MSK: Reports bilateral knee  pain.   The 10-year ASCVD risk score (Arnett DK, et al., 2019) is: 50%  Health Maintenance Due  Topic Date Due   Hepatitis C Screening  Never done   Zoster Vaccines- Shingrix  (2 of 2) 07/30/2019   COVID-19 Vaccine (4 - 2025-26 season) 01/17/2024   Medicare Annual Wellness (AWV)  03/22/2024      Objective:     BP 127/86   Pulse 82   Ht 5' 10 (1.778 m)   Wt 217 lb (98.4 kg)   SpO2 97%   BMI 31.14 kg/m  {Vitals History (Optional):23777}  Physical Exam General: Alert, oriented HEENT: PERRLA, EOMI, moist mucosal membrane.  False upper teeth CV: Rate rate rhythm no murmurs Pulmonary: Lungs clear bilaterally no wheeze or crackles GI: Soft, normal bowel sounds MSK: Strength equal bilaterally, normal gait. Extremities: No pedal edema Psych: Pleasant affect.   No results found for any visits on 01/27/24.      Assessment & Plan:   Physical exam, annual  Microalbuminuria due to type 2 diabetes mellitus (HCC) Assessment & Plan: - Patient concerned about elevated microalbumin on recent labs. Creatinine level is normal. This is less concerning given the normal creatinine. He is already on maximal medical therapy with an ARB (losartan ) and SGLT2i (empagliflozin) for renal protection. - Reassured patient that current management is appropriate. - Plan to monitor renal function with labs in 3 months.    Immunization due -     Flu vaccine HIGH DOSE PF(Fluzone Trivalent)  Diabetes mellitus without complication [facility]) Assessment & Plan: - Patient presents for routine follow-up. [facility] A1c was 7.2%, which is inconsistent with CGM data and prior A1c levels. In-office point-of-care A1c today  is 6.8%. Patient is on an appropriate regimen including synjardy  and basal/bolus insulin . Blood pressure and blood sugar are generally well-controlled. - Continue current insulin  regimen (Tresiba  42 units daily, short-acting with meals). - Continue empagliflozin and losartan . - Recheck labs  including A1c in 3 months. - Follow up in 3 months.   smoking cessation counseling Assessment & Plan: - Patient expressed interest in quitting smoking. Discussed options including pharmacotherapy. Counseled on the increased efficacy of combination therapy. - Prescribed Chantix  starter pack to be sent to [facility]. Counseled on potential side effect of vivid dreams. - Recommended adding an over-the-counter nicotine  lozenge for breakthrough cravings. - Provided behavioral counseling on smoking cessation strategies.   Primary osteoarthritis of left knee Assessment & Plan: - Patient has chronic bilateral knee pain managed with ibuprofen . Has received prior injections. Considering total knee arthroplasty but is hesitant due to social situation and recovery logistics. - Counseled that recovery from surgery will only become more difficult with increasing age and encouraged him to proceed sooner rather than later if he has decided on surgery.   Healthcare maintenance Assessment & Plan: - Patient agreed to receive the influenza vaccine today. - Discussed completing the Shingrix  vaccine series. He received the first dose previously but had significant arm soreness for 3 days. Did not have any other reaction. Recommended he receive the second dose at a pharmacy to complete the series.   Erythrocytosis Assessment & Plan: - Recent labs showed a new mild leukocytosis and borderline high hemoglobin of 18.5 (previously 17.7). Patient is a smoker, which is the most likely cause of the erythrocytosis. Denies symptoms of sleep apnea. Discussed potential other causes including polycythemia and  increased risk of stroke d/t erythrocytosis. - Recheck CBC in 3 months to ensure elevation is persistent before considering further workup or hematology referral.   Other orders -     Varenicline  Tartrate (Starter); Take as directed on starter pack  Dispense: 1 each; Refill: 0 -     Nicotine  Polacrilex;  Take 1 lozenge (2 mg total) by mouth as needed for smoking cessation.  Dispense: 100 tablet; Refill: 0     Return in about 4 months (around 05/28/2024) for DM.    Toribio MARLA Slain, MD

## 2024-01-27 NOTE — Assessment & Plan Note (Signed)
-   Patient agreed to receive the influenza vaccine today. - Discussed completing the Shingrix  vaccine series. He received the first dose previously but had significant arm soreness for 3 days. Did not have any other reaction. Recommended he receive the second dose at a pharmacy to complete the series.

## 2024-01-27 NOTE — Assessment & Plan Note (Signed)
-   Patient presents for routine follow-up. [facility] A1c was 7.2%, which is inconsistent with CGM data and prior A1c levels. In-office point-of-care A1c today is 6.8%. Patient is on an appropriate regimen including synjardy  and basal/bolus insulin . Blood pressure and blood sugar are generally well-controlled. - Continue current insulin  regimen (Tresiba  42 units daily, short-acting with meals). - Continue empagliflozin and losartan . - Recheck labs including A1c in 3 months. - Follow up in 3 months.

## 2024-01-27 NOTE — Patient Instructions (Addendum)
 It was nice to see you today,  We addressed the following topics today: -I am sending in the Chantix  starter pack.  If you need Chantix  beyond the first month let us  know and I will send in the maintenance pack - I have also sent a prescription for lozenges but these may not be covered so you can pick them up over-the-counter. - We will recheck your blood test in 4 months when I see you again.    Have a great day,  Rolan Slain, MD

## 2024-01-27 NOTE — Assessment & Plan Note (Signed)
-   Recent labs showed a new mild leukocytosis and borderline high hemoglobin of 18.5 (previously 17.7). Patient is a smoker, which is the most likely cause of the erythrocytosis. Denies symptoms of sleep apnea. Discussed potential other causes including polycythemia and  increased risk of stroke d/t erythrocytosis. - Recheck CBC in 3 months to ensure elevation is persistent before considering further workup or hematology referral.

## 2024-01-27 NOTE — Assessment & Plan Note (Signed)
-   Patient expressed interest in quitting smoking. Discussed options including pharmacotherapy. Counseled on the increased efficacy of combination therapy. - Prescribed Chantix  starter pack to be sent to [facility]. Counseled on potential side effect of vivid dreams. - Recommended adding an over-the-counter nicotine  lozenge for breakthrough cravings. - Provided behavioral counseling on smoking cessation strategies.

## 2024-01-27 NOTE — Assessment & Plan Note (Signed)
-   Patient concerned about elevated microalbumin on recent labs. Creatinine level is normal. This is less concerning given the normal creatinine. He is already on maximal medical therapy with an ARB (losartan ) and SGLT2i (empagliflozin) for renal protection. - Reassured patient that current management is appropriate. - Plan to monitor renal function with labs in 3 months.

## 2024-02-10 ENCOUNTER — Other Ambulatory Visit: Payer: Self-pay | Admitting: Family Medicine

## 2024-02-10 DIAGNOSIS — E1165 Type 2 diabetes mellitus with hyperglycemia: Secondary | ICD-10-CM

## 2024-02-27 ENCOUNTER — Other Ambulatory Visit: Payer: Self-pay | Admitting: Family Medicine

## 2024-02-27 DIAGNOSIS — E1169 Type 2 diabetes mellitus with other specified complication: Secondary | ICD-10-CM

## 2024-03-01 ENCOUNTER — Other Ambulatory Visit: Payer: Self-pay | Admitting: Family Medicine

## 2024-03-19 ENCOUNTER — Other Ambulatory Visit: Payer: Self-pay

## 2024-03-19 NOTE — Progress Notes (Signed)
 Contacted patient to discuss potential medication access barriers related to use of non-preferred pharmacy. Discussed health plan preferred pharmacies.  Counseled on the cost savings if he were to transition to a preferred pharmacy, but he is not interested at this time. He states that he has been with [facility] for 30 years and has built relationships with the staff there. Encourage him to call back if he has any outstanding questions or concerns.   Woodie Jock, PharmD PGY1 Pharmacy Resident  03/19/2024

## 2024-03-31 ENCOUNTER — Other Ambulatory Visit: Payer: Self-pay | Admitting: Family Medicine

## 2024-03-31 DIAGNOSIS — I10 Essential (primary) hypertension: Secondary | ICD-10-CM

## 2024-03-31 DIAGNOSIS — E119 Type 2 diabetes mellitus without complications: Secondary | ICD-10-CM

## 2024-05-22 ENCOUNTER — Other Ambulatory Visit

## 2024-05-22 DIAGNOSIS — E119 Type 2 diabetes mellitus without complications: Secondary | ICD-10-CM

## 2024-05-22 DIAGNOSIS — D751 Secondary polycythemia: Secondary | ICD-10-CM

## 2024-05-23 ENCOUNTER — Ambulatory Visit: Payer: Self-pay | Admitting: Family Medicine

## 2024-05-23 LAB — CBC WITH DIFFERENTIAL/PLATELET
Basophils Absolute: 0.1 x10E3/uL (ref 0.0–0.2)
Basos: 1 %
EOS (ABSOLUTE): 0.4 x10E3/uL (ref 0.0–0.4)
Eos: 5 %
Hematocrit: 53.6 % — ABNORMAL HIGH (ref 37.5–51.0)
Hemoglobin: 17.8 g/dL — ABNORMAL HIGH (ref 13.0–17.7)
Immature Grans (Abs): 0 x10E3/uL (ref 0.0–0.1)
Immature Granulocytes: 0 %
Lymphocytes Absolute: 3.4 x10E3/uL — ABNORMAL HIGH (ref 0.7–3.1)
Lymphs: 39 %
MCH: 30.6 pg (ref 26.6–33.0)
MCHC: 33.2 g/dL (ref 31.5–35.7)
MCV: 92 fL (ref 79–97)
Monocytes Absolute: 0.6 x10E3/uL (ref 0.1–0.9)
Monocytes: 7 %
Neutrophils Absolute: 4.3 x10E3/uL (ref 1.4–7.0)
Neutrophils: 47 %
Platelets: 332 x10E3/uL (ref 150–450)
RBC: 5.82 x10E6/uL — ABNORMAL HIGH (ref 4.14–5.80)
RDW: 12.7 % (ref 11.6–15.4)
WBC: 8.8 x10E3/uL (ref 3.4–10.8)

## 2024-05-23 LAB — COMPREHENSIVE METABOLIC PANEL WITH GFR
ALT: 18 IU/L (ref 0–44)
AST: 13 IU/L (ref 0–40)
Albumin: 4.1 g/dL (ref 3.8–4.8)
Alkaline Phosphatase: 82 IU/L (ref 47–123)
BUN/Creatinine Ratio: 21 (ref 10–24)
BUN: 19 mg/dL (ref 8–27)
Bilirubin Total: 0.3 mg/dL (ref 0.0–1.2)
CO2: 21 mmol/L (ref 20–29)
Calcium: 9.3 mg/dL (ref 8.6–10.2)
Chloride: 100 mmol/L (ref 96–106)
Creatinine, Ser: 0.91 mg/dL (ref 0.76–1.27)
Globulin, Total: 3.1 g/dL (ref 1.5–4.5)
Glucose: 147 mg/dL — ABNORMAL HIGH (ref 70–99)
Potassium: 4.5 mmol/L (ref 3.5–5.2)
Sodium: 138 mmol/L (ref 134–144)
Total Protein: 7.2 g/dL (ref 6.0–8.5)
eGFR: 88 mL/min/1.73

## 2024-05-23 LAB — HEMOGLOBIN A1C
Est. average glucose Bld gHb Est-mCnc: 169 mg/dL
Hgb A1c MFr Bld: 7.5 % — ABNORMAL HIGH (ref 4.8–5.6)

## 2024-05-23 LAB — MICROALBUMIN / CREATININE URINE RATIO
Creatinine, Urine: 72.6 mg/dL
Microalb/Creat Ratio: 879 mg/g{creat} — ABNORMAL HIGH (ref 0–29)
Microalbumin, Urine: 637.8 ug/mL

## 2024-05-29 ENCOUNTER — Encounter: Payer: Self-pay | Admitting: Family Medicine

## 2024-05-29 ENCOUNTER — Ambulatory Visit: Admitting: Family Medicine

## 2024-05-29 VITALS — BP 119/78 | HR 85 | Ht 70.0 in | Wt 226.1 lb

## 2024-05-29 DIAGNOSIS — D751 Secondary polycythemia: Secondary | ICD-10-CM

## 2024-05-29 DIAGNOSIS — Z87891 Personal history of nicotine dependence: Secondary | ICD-10-CM | POA: Diagnosis not present

## 2024-05-29 DIAGNOSIS — I152 Hypertension secondary to endocrine disorders: Secondary | ICD-10-CM | POA: Diagnosis not present

## 2024-05-29 DIAGNOSIS — Z794 Long term (current) use of insulin: Secondary | ICD-10-CM

## 2024-05-29 DIAGNOSIS — E782 Mixed hyperlipidemia: Secondary | ICD-10-CM

## 2024-05-29 DIAGNOSIS — R809 Proteinuria, unspecified: Secondary | ICD-10-CM

## 2024-05-29 DIAGNOSIS — E1159 Type 2 diabetes mellitus with other circulatory complications: Secondary | ICD-10-CM | POA: Diagnosis not present

## 2024-05-29 DIAGNOSIS — E1169 Type 2 diabetes mellitus with other specified complication: Secondary | ICD-10-CM

## 2024-05-29 DIAGNOSIS — E1129 Type 2 diabetes mellitus with other diabetic kidney complication: Secondary | ICD-10-CM

## 2024-05-29 DIAGNOSIS — I10 Essential (primary) hypertension: Secondary | ICD-10-CM | POA: Diagnosis not present

## 2024-05-29 DIAGNOSIS — Z7984 Long term (current) use of oral hypoglycemic drugs: Secondary | ICD-10-CM

## 2024-05-29 DIAGNOSIS — E119 Type 2 diabetes mellitus without complications: Secondary | ICD-10-CM

## 2024-05-29 MED ORDER — LOSARTAN POTASSIUM 25 MG PO TABS: 25.0000 mg | ORAL_TABLET | Freq: Two times a day (BID) | ORAL | 1 refills | Status: AC

## 2024-05-29 NOTE — Patient Instructions (Signed)
" °  VISIT SUMMARY: Today we discussed your kidney function, diabetes, and hypertension management. You have successfully quit smoking since September and are no longer using Chantix  or lozenges.  YOUR PLAN: TYPE 2 DIABETES MELLITUS WITH DIABETIC NEPHROPATHY: Your A1c is at 7.5, which is acceptable. Your kidney function is good with a GFR of 88, but you have proteinuria likely from diabetic nephropathy. -Increase losartan  to 25 mg twice daily. -Monitor your blood pressure at home twice daily. -Recheck kidney function and proteinuria in 3 months.  ESSENTIAL HYPERTENSION: Your blood pressure is well controlled at 119/78. We discussed managing proteinuria with losartan  without affecting your blood pressure. -Monitor your blood pressure at home twice daily, especially after increasing losartan  dosing.  NICOTINE  DEPENDENCE, IN REMISSION: You have quit smoking since September using Chantix  and behavioral strategies.  -congratulations on quitting smoking    "

## 2024-05-29 NOTE — Assessment & Plan Note (Signed)
 Quit smoking since September with Chantix  and behavioral strategies. No adverse effects from Chantix . Discussed benefits of cessation.

## 2024-05-29 NOTE — Assessment & Plan Note (Signed)
 Continue simvastatin . Tolerating well.  No concerns.  Recheck at next visit.

## 2024-05-29 NOTE — Progress Notes (Signed)
 "  Established Patient Office Visit  Subjective   Patient ID: Jose Munoz, male    DOB: 05-16-1951  Age: 74 y.o. MRN: [id number]  Chief Complaint  Patient presents with   Medical Management of Chronic Issues     History of Present Illness   Jose Munoz is a 74 year old male with diabetes and hypertension who presents for follow-up on kidney function and medication management.  He quit smoking in September after about two weeks of tapering and is no longer using Chantix  or lozenges. Since quitting he has increased snacking.  He is worried about his kidney function after seeing a creatinine reported as 930 with GFR 88 and is confused about whether his kidneys are healthy.  He takes losartan  once daily and Synjardy  XR twice daily, plus 42 units of long-acting insulin  and 8 to 14 units of short-acting insulin  with meals. His recent A1c was 7.5, which he attributes to increased snacking after smoking cessation.  He has no new symptoms or other concerns today.          The 10-year ASCVD risk score (Arnett DK, et al., 2019) is: 46.1%  Health Maintenance Due  Topic Date Due   Hepatitis C Screening  Never done   Zoster Vaccines- Shingrix  (2 of 2) 07/30/2019   COVID-19 Vaccine (4 - 2025-26 season) 01/17/2024   Medicare Annual Wellness (AWV)  03/22/2024      Objective:     BP 119/78   Pulse 85   Ht 5' 10 (1.778 m)   Wt 226 lb 1.9 oz (102.6 kg)   SpO2 96%   BMI 32.44 kg/m    Physical Exam   VITALS: BP- 119/78     Gen: alert, oriented Pulm: no respiratory distress Psych: pleasant affect    No results found for any visits on 05/29/24.      Assessment & Plan:   Type 2 diabetes mellitus with proteinuria (HCC) Assessment & Plan: A1c at 7.5 is acceptable. Proteinuria likely from diabetic nephropathy. GFR at 88 indicates good kidney function. Discussed proteinuria management to prevent kidney disease progression. - Increased losartan  to 25 mg twice daily. -  Instructed to monitor blood pressure at home twice daily. - Recheck kidney function and proteinuria in 3 months.   Essential hypertension -     Losartan  Potassium; Take 1 tablet (25 mg total) by mouth in the morning and at bedtime. TAKE 1 TABLET(25 MG) BY MOUTH DAILY  Dispense: 180 tablet; Refill: 1  Erythrocytosis -     CBC with Differential/Platelet; Future -     Erythropoietin; Future  Hypertension associated with diabetes (HCC) Assessment & Plan: Blood pressure controlled at 119/78. Losartan  management discussed for proteinuria without affecting blood pressure. - Monitor blood pressure at home twice daily, especially after increasing losartan  dosing.   Former smoker Assessment & Plan: Quit smoking since September with Chantix  and behavioral strategies. No adverse effects from Chantix . Discussed benefits of cessation.    Encounter for long-term (current) use of insulin  (HCC)  Diabetes mellitus treated with oral medication (HCC) -     Comprehensive metabolic panel with GFR; Future -     Hemoglobin A1c; Future -     Microalbumin / creatinine urine ratio; Future  Mixed diabetic hyperlipidemia associated with type 2 diabetes mellitus (HCC) Assessment & Plan: Continue simvastatin . Tolerating well.  No concerns.  Recheck at next visit.   Orders: -     Lipid panel; Future       Return in about 3 months (  around 08/27/2024) for DM.    Toribio MARLA Slain, MD  "

## 2024-05-29 NOTE — Assessment & Plan Note (Signed)
 A1c at 7.5 is acceptable. Proteinuria likely from diabetic nephropathy. GFR at 88 indicates good kidney function. Discussed proteinuria management to prevent kidney disease progression. - Increased losartan  to 25 mg twice daily. - Instructed to monitor blood pressure at home twice daily. - Recheck kidney function and proteinuria in 3 months.

## 2024-05-29 NOTE — Assessment & Plan Note (Signed)
 Blood pressure controlled at 119/78. Losartan  management discussed for proteinuria without affecting blood pressure. - Monitor blood pressure at home twice daily, especially after increasing losartan  dosing.

## 2024-08-28 ENCOUNTER — Other Ambulatory Visit

## 2024-09-04 ENCOUNTER — Ambulatory Visit: Admitting: Family Medicine
